# Patient Record
Sex: Female | Born: 1960 | Race: Black or African American | Hispanic: No | Smoking: Current every day smoker
Health system: Southern US, Community
[De-identification: ages and names within clinical notes are randomized; demographics above are authoritative.]

## PROBLEM LIST (undated history)

## (undated) DIAGNOSIS — I219 Acute myocardial infarction, unspecified: Secondary | ICD-10-CM

## (undated) DIAGNOSIS — I10 Essential (primary) hypertension: Secondary | ICD-10-CM

## (undated) DIAGNOSIS — I5042 Chronic combined systolic (congestive) and diastolic (congestive) heart failure: Secondary | ICD-10-CM

## (undated) DIAGNOSIS — IMO0002 Reserved for concepts with insufficient information to code with codable children: Secondary | ICD-10-CM

## (undated) DIAGNOSIS — M1990 Unspecified osteoarthritis, unspecified site: Secondary | ICD-10-CM

## (undated) DIAGNOSIS — I2510 Atherosclerotic heart disease of native coronary artery without angina pectoris: Secondary | ICD-10-CM

## (undated) DIAGNOSIS — N183 Chronic kidney disease, stage 3 unspecified: Secondary | ICD-10-CM

## (undated) DIAGNOSIS — G4700 Insomnia, unspecified: Secondary | ICD-10-CM

## (undated) DIAGNOSIS — J449 Chronic obstructive pulmonary disease, unspecified: Secondary | ICD-10-CM

## (undated) DIAGNOSIS — K219 Gastro-esophageal reflux disease without esophagitis: Secondary | ICD-10-CM

## (undated) DIAGNOSIS — E785 Hyperlipidemia, unspecified: Secondary | ICD-10-CM

## (undated) DIAGNOSIS — E119 Type 2 diabetes mellitus without complications: Secondary | ICD-10-CM

## (undated) DIAGNOSIS — Z9861 Coronary angioplasty status: Secondary | ICD-10-CM

## (undated) HISTORY — DX: Insomnia, unspecified: G47.00

## (undated) HISTORY — DX: Chronic kidney disease, stage 3 (moderate): N18.3

## (undated) HISTORY — PX: CORONARY ARTERY BYPASS GRAFT: SHX141

## (undated) HISTORY — DX: Coronary angioplasty status: Z98.61

## (undated) HISTORY — DX: Essential (primary) hypertension: I10

## (undated) HISTORY — DX: Chronic kidney disease, stage 3 unspecified: N18.30

## (undated) HISTORY — PX: OTHER SURGICAL HISTORY: SHX169

## (undated) HISTORY — DX: Type 2 diabetes mellitus without complications: E11.9

## (undated) HISTORY — PX: PARTIAL HYSTERECTOMY: SHX80

## (undated) HISTORY — PX: ABDOMINAL HYSTERECTOMY: SHX81

## (undated) HISTORY — DX: Reserved for concepts with insufficient information to code with codable children: IMO0002

## (undated) HISTORY — DX: Atherosclerotic heart disease of native coronary artery without angina pectoris: I25.10

## (undated) HISTORY — PX: BREAST EXCISIONAL BIOPSY: SUR124

## (undated) HISTORY — PX: CARPAL TUNNEL RELEASE: SHX101

---

## 1999-07-27 ENCOUNTER — Inpatient Hospital Stay (HOSPITAL_COMMUNITY): Admission: AD | Admit: 1999-07-27 | Discharge: 1999-07-28 | Payer: Self-pay | Admitting: Cardiology

## 2004-02-04 ENCOUNTER — Emergency Department: Payer: Self-pay | Admitting: Emergency Medicine

## 2004-05-08 ENCOUNTER — Inpatient Hospital Stay: Payer: Self-pay | Admitting: Internal Medicine

## 2004-07-27 ENCOUNTER — Ambulatory Visit: Payer: Self-pay | Admitting: Internal Medicine

## 2004-10-12 ENCOUNTER — Other Ambulatory Visit: Payer: Self-pay

## 2004-10-12 ENCOUNTER — Emergency Department: Payer: Self-pay | Admitting: Unknown Physician Specialty

## 2004-12-22 ENCOUNTER — Emergency Department: Payer: Self-pay | Admitting: Emergency Medicine

## 2004-12-22 ENCOUNTER — Other Ambulatory Visit: Payer: Self-pay

## 2005-06-19 ENCOUNTER — Ambulatory Visit: Payer: Self-pay | Admitting: Internal Medicine

## 2005-10-15 ENCOUNTER — Other Ambulatory Visit: Payer: Self-pay

## 2005-10-15 ENCOUNTER — Emergency Department: Payer: Self-pay | Admitting: Unknown Physician Specialty

## 2005-11-09 ENCOUNTER — Ambulatory Visit: Payer: Self-pay | Admitting: Cardiovascular Disease

## 2005-11-16 ENCOUNTER — Ambulatory Visit: Payer: Self-pay | Admitting: Oncology

## 2005-11-28 LAB — COMPREHENSIVE METABOLIC PANEL
ALT: 20 U/L (ref 0–40)
AST: 21 U/L (ref 0–37)
Alkaline Phosphatase: 87 U/L (ref 39–117)
Creatinine, Ser: 0.8 mg/dL (ref 0.40–1.20)
Total Bilirubin: 0.6 mg/dL (ref 0.3–1.2)

## 2005-11-28 LAB — CBC WITH DIFFERENTIAL (CANCER CENTER ONLY)
BASO#: 0.1 10*3/uL (ref 0.0–0.2)
EOS%: 2 % (ref 0.0–7.0)
HCT: 43.7 % (ref 34.8–46.6)
HGB: 14.6 g/dL (ref 11.6–15.9)
LYMPH#: 3.5 10*3/uL — ABNORMAL HIGH (ref 0.9–3.3)
MCHC: 33.5 g/dL (ref 32.0–36.0)
MCV: 93 fL (ref 81–101)
NEUT%: 58.4 % (ref 39.6–80.0)

## 2005-11-28 LAB — LACTATE DEHYDROGENASE: LDH: 149 U/L (ref 94–250)

## 2005-12-04 ENCOUNTER — Ambulatory Visit (HOSPITAL_COMMUNITY): Admission: RE | Admit: 2005-12-04 | Discharge: 2005-12-04 | Payer: Self-pay | Admitting: Oncology

## 2005-12-05 ENCOUNTER — Ambulatory Visit: Payer: Self-pay | Admitting: Internal Medicine

## 2006-04-21 ENCOUNTER — Emergency Department: Payer: Self-pay | Admitting: Emergency Medicine

## 2006-04-21 ENCOUNTER — Other Ambulatory Visit: Payer: Self-pay

## 2007-04-13 ENCOUNTER — Emergency Department: Payer: Self-pay | Admitting: Emergency Medicine

## 2007-07-16 ENCOUNTER — Other Ambulatory Visit: Payer: Self-pay

## 2007-07-16 ENCOUNTER — Emergency Department: Payer: Self-pay | Admitting: Emergency Medicine

## 2007-07-28 ENCOUNTER — Ambulatory Visit: Payer: Self-pay | Admitting: Cardiovascular Disease

## 2007-07-29 ENCOUNTER — Emergency Department (HOSPITAL_COMMUNITY): Admission: EM | Admit: 2007-07-29 | Discharge: 2007-07-29 | Payer: Self-pay | Admitting: Emergency Medicine

## 2007-09-13 ENCOUNTER — Emergency Department: Payer: Self-pay | Admitting: Unknown Physician Specialty

## 2008-01-09 ENCOUNTER — Emergency Department: Payer: Self-pay

## 2008-01-28 ENCOUNTER — Emergency Department: Payer: Self-pay | Admitting: Emergency Medicine

## 2008-01-29 ENCOUNTER — Emergency Department: Payer: Self-pay | Admitting: Emergency Medicine

## 2008-02-01 ENCOUNTER — Emergency Department: Payer: Self-pay | Admitting: Emergency Medicine

## 2008-02-08 ENCOUNTER — Emergency Department: Payer: Self-pay | Admitting: Emergency Medicine

## 2008-02-09 ENCOUNTER — Inpatient Hospital Stay: Payer: Self-pay | Admitting: Internal Medicine

## 2008-02-15 ENCOUNTER — Emergency Department: Payer: Self-pay | Admitting: Emergency Medicine

## 2008-02-25 ENCOUNTER — Ambulatory Visit: Payer: Self-pay | Admitting: Gastroenterology

## 2008-04-22 ENCOUNTER — Inpatient Hospital Stay: Payer: Self-pay | Admitting: Internal Medicine

## 2008-05-20 ENCOUNTER — Ambulatory Visit: Payer: Self-pay | Admitting: Internal Medicine

## 2008-08-14 ENCOUNTER — Emergency Department: Payer: Self-pay | Admitting: Emergency Medicine

## 2008-08-18 ENCOUNTER — Emergency Department: Payer: Self-pay | Admitting: Emergency Medicine

## 2008-12-07 ENCOUNTER — Emergency Department: Payer: Self-pay | Admitting: Internal Medicine

## 2009-01-31 ENCOUNTER — Emergency Department: Payer: Self-pay | Admitting: Emergency Medicine

## 2009-04-22 ENCOUNTER — Emergency Department: Payer: Self-pay | Admitting: Emergency Medicine

## 2009-05-05 ENCOUNTER — Ambulatory Visit: Payer: Self-pay | Admitting: Internal Medicine

## 2009-05-24 ENCOUNTER — Ambulatory Visit: Payer: Self-pay | Admitting: Specialist

## 2009-06-16 ENCOUNTER — Ambulatory Visit: Payer: Self-pay | Admitting: Specialist

## 2009-11-14 ENCOUNTER — Ambulatory Visit: Payer: Self-pay | Admitting: Internal Medicine

## 2009-11-28 ENCOUNTER — Inpatient Hospital Stay: Payer: Self-pay | Admitting: Internal Medicine

## 2009-12-15 ENCOUNTER — Ambulatory Visit: Payer: Self-pay | Admitting: Internal Medicine

## 2009-12-26 ENCOUNTER — Ambulatory Visit: Payer: Self-pay | Admitting: General Surgery

## 2009-12-28 ENCOUNTER — Inpatient Hospital Stay: Payer: Self-pay | Admitting: General Surgery

## 2010-01-17 LAB — PATHOLOGY REPORT

## 2010-02-20 ENCOUNTER — Emergency Department: Payer: Self-pay | Admitting: Emergency Medicine

## 2010-03-20 ENCOUNTER — Emergency Department: Payer: Self-pay | Admitting: Emergency Medicine

## 2010-03-21 ENCOUNTER — Emergency Department: Payer: Self-pay | Admitting: Emergency Medicine

## 2010-04-04 ENCOUNTER — Ambulatory Visit: Payer: Self-pay | Admitting: Internal Medicine

## 2010-04-17 ENCOUNTER — Emergency Department: Payer: Medicare Other | Admitting: Emergency Medicine

## 2010-05-29 ENCOUNTER — Emergency Department: Payer: Medicare Other | Admitting: Emergency Medicine

## 2010-09-01 NOTE — Op Note (Signed)
[place]  Patient:    VIVIEN, VILCHIS                     MRN: [id number] Proc. Date: 07/27/99 Adm. Date:  [id number] Attending:  Clent Demark CC:         _________             Cath Lab             Allegra Lai Terrence Dupont, M.D.                           Operative Report  PROCEDURE:  Successful percutaneous transluminal coronary angioplasty to proximal right coronary artery using 3.0 x 15 mm long CrossSail balloon.  INDICATIONS:  Mr. Bushra Mock is a 50 year old black female with past medical history significant for inferior wall MI, status post PTCA and stenting at [facility] in July 2000 and hypertension, non-insulin-dependent diabetes mellitus, hypercholesterolemia, tobacco abuse, positive family history of coronary artery  disease.  Complained of recurrent ______, chest pain approximately 3-4 times per day relieved with 1-2 sublingual nitroglycerin.  They last for about 6-7 minutes. Patient also gives history of rest and nocturnal angina for last 3-4 days and subsequently had left catheterization done today at [facility],  which showed good LV systolic function.  Left main was patent and LAD had 30-40% mid stenosis and the left circumflex had 30-40% mid stenosis.  RCA had 70% proximal in-stent stenosis, which 100% occluded due to catheter induced spasm, which resulted after intracoronary nitroglycerin.  Patient presently is pain-free. Patient ______.  Patient ______ of percutaneous intervention.  PROCEDURE:  After obtaining the informed consent, patient was brought to the catheterization laboratory and was placed on fluoroscopy table and left groin was prepped and draped in usual fashion.  Xylocaine 2% was used for local anesthesia in the left groin.  A thin-wall needle and 6-French arterial sheath were placed. he sheath was aspirated and flushed.  Next, a 6-French left right guiding catheter was advanced over the wire  under fluoroscopic guidance up to the ascending aorta. ire was pulled out, the catheter was aspirated and connected to the manifold. Catheter was further advanced and engaged into right coronary ostium.  ______ of the right system were taken.  FINDINGS:  RCA showed in-stent 75-80% restenosis and it appears distal half of he stent is also not fully expanded.  ______ procedure.  Successful PTCA to proximal RCA was done using 3.0 x 15 mm long CrossSail balloon.  Two inflations were done using ______ of pressure and lesion was dilated from 80% to less than 0% ______  ______ flow without evidence of dissection or distal embolization.  Patient received ______ heparin, Reopro and  150 of Plavix and intracoronary nitroglycerin prior to the procedure.  The patient tolerated the procedure well.  There were no complications. Patient was transferred to ______ angioplasty unit for hemodynamic monitoring and anticoagulation. DD:  07/28/99 TD:  07/28/99 Job: [id number]

## 2010-09-01 NOTE — Discharge Summary (Signed)
. [facility]  Patient:    Tasha Myers, Tasha Myers                     MRN: [id number] Adm. Date:  [id number] Disc. Date: 07/28/99 Attending:  Clent Demark CC:         Allegra Lai. Terrence Dupont, M.D.                           Discharge Summary  ADMITTING DIAGNOSES:  1. Accelerated angina status post left coronary artery bypass at [facility].  2. History of inferior wall myocardial infarction status post percutaneous     transluminal coronary angioplasty and stenting to proximal RCA in July, 2000,     at [facility].  3. Hypertension.  4. Insulin-requiring diabetes mellitus.  5. Hypercholesterolemia.  6. Positive family history of coronary artery disease.  7. Tobacco abuse.  FINAL DIAGNOSES:  1. Accelerated angina status post percutaneous transluminal coronary angioplasty     to proximal right coronary artery.  2. History of inferior wall myocardial infarction status post percutaneous     transluminal coronary angioplasty and stenting to right coronary artery in     July, 2000.  Stent was 3.0 x 16 Nir.  3. Hypertension.  4. Insulin-requiring diabetes mellitus.  5. Hypercholesterolemia.  6. Tobacco abuse.  7. Family history of coronary artery disease.  8. Anemia secondary to blood loss during the procedures.  9. Hydration. 10. History of depression.  DISCHARGE MEDICATIONS:  1. Tiazac 249 mg one capsule daily.  2. Imdur 60 mg one tablet daily in a.m.  3. Prinivil 10 mg one tablet daily.  4. Plavix 75 mg one tablet daily with food for one month.  5. Enteric-coated aspirin one tablet daily.  6. Lipitor 10 mg one tablet daily.  7. Tricor 200 mg one daily.  8. Nitrostat 0.4 mg sublingual, use as directed.  9. Humulin insulin 70/30, 20 units in the morning, 20 units in the evening as     before. 10. Zoloft 50 mg one tablet daily as before.  ACTIVITY:  Avoid heavy lifting, pushing, or pulling for 48 hours.  DIET:  Low-salt,  low-cholesterol, 1800-calorie, ADA diet.  DISCHARGE INSTRUCTIONS:  Post-angioplasty instructions have been given. Patient has been advised to monitor blood sugar closely and stop smoking, follow with Dr. ______ in one week.  CONDITION ON DISCHARGE:  Stable.  BRIEF HISTORY AND HOSPITAL COURSE:  Tasha Myers is a 50 year old female with past medical history significant for inferior wall MI status post PTCA and stenting at [facility] in July, 2000, history of hypertension, history of noninsulin-dependent  diabetes mellitus, hypercholesterolemia, tobacco abuse, positive family history of coronary artery disease, complaint of recurrent retrosternal squeezing chest pain three to four times per day relieved with one to two sublingual nitroglycerin.  Pain lasts for six to seven minutes.  Patient also gives history of rest and nocturnal angina for the last three to four days and subsequently had left ______ at [facility] which showed good LV systolic function.  Left main was patent.  The LAD and circumflex had 30 to 40% mid stenosis, RCA had 70% in-stent restenosis which got 100% included due to severe catheter-induced spasm which resolved after IC nitroglycerin.  Patient presently is pain-free and is transferred here for BCI.  PAST MEDICAL HISTORY:  As above.  PAST SURGICAL HISTORY:  She had bilateral carpal tunnel surgery in  1989.  She had partial hysterectomy at the age of 30 for menorrhagia.  ALLERGIES:  No known drug allergies.  MEDICATIONS:  1. Toprol XL 50 mg p.o. q.d.  2. Norvasc 5 mg p.o. q.d.  3. Prinivil 20 mg p.o. q.d.  4. Imdur 60 mg p.o. q.d.  5. Avandia 4 mg p.o. q.d.  6. Zoloft 50 mg p.o. q.d.  7. Prilosec 20 mg p.o. b.i.d.  8. Humulin insulin 70/30 20 units in the morning and 20 units in the evening.   SOCIAL HISTORY:  She is married, on disability.  Smoked 1-1/2 pack for 20 years and now half pack for the last two to three months.  She drinks  beer socially.  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  FAMILY HISTORY:  Father died of diabetic complications.  Mother is alive.  She ad MI in her 77s.  She had a stroke subsequently.  She has seven sisters and three  brothers in good health.  PHYSICAL EXAMINATION:  GENERAL:  She alert, awake, oriented x 3 in no acute distress, hemodynamically stable, sinus rhythm on monitor.  LUNGS:  Clear.  CARDIOVASCULAR:  S1, S2 normal.  There was no S3 gallop.  ABDOMEN:  Soft.  Bowel sounds present.  Nontender.  EXTREMITIES:  No clubbing, cyanosis, or edema.  GU:  Right groin - there was no evidence of hematoma.  HOSPITAL COURSE:  Discussed with patient regarding PTCA for in-stent restenosis, its risks, i.e., death, MI, stroke, need for emergency CABG, restenosis, local vascular complications ______ and consented for the procedure.  Patient subsequently underwent PTCA to the proximal RCA.  As per PTCA report, patient tolerated the procedure well.  There were no complications.  Her artery sheaths  were pulled out last night.  There was no evidence of hematoma.  Patient has ambulated in the morning without any problems.  Her post-procedure CPKs are 54 which is negative.  Her potassium is 3.5.  Hemoglobin and hematocrit have been stable.  Her BUN is 9, creatinine 0.6.  Patient will be discharged home on above medications and will be followed by Dr. ______ in one week.  Patient has been advised as to rehab which she will discuss with Dr. ______ and will go to [facility].  Patient is discharged home in stable condition. DD:  07/28/99 TD:  07/28/99 Job: [id number]

## 2010-09-01 NOTE — Assessment & Plan Note (Signed)
[facility]                               PULMONARY OFFICE NOTE   NAME:JONESSintia, Greenly                     MRN:          [id number]  DATE:12/05/2005                            DOB:          07-14-1960    HISTORY:  A 50 year old black female with intermittent chest pain that comes  and goes, over the last year always in the same location, that is anterior  just to the left of her sternum radiating to her back, positional in nature,  worse when she lies down at night, now back 100%.  Now is being evaluated  for an abnormal CT scan but note the chest pain has totally resolved that  resulted in a chest CT scan that suggested multiple nodules.  A PET was  subsequently done by Dr. Humphrey Rolls which indicated no evidence of activity, nor  was there any evidence of significant activity in lymph nodes.  Patient  denies any unintended weight loss, previous history of cancer, fevers,  chills, sweats, associated cough or dyspnea.   PAST MEDICAL HISTORY:  Significant for hypertension, ischemic heart disease  status post angioplasty in 2000.   ALLERGIES:  None known.   MEDICATIONS:  Taken in detail. In fact she is on three different PPIs.  See  column dated December 05, 2005 for details.   SOCIAL HISTORY:  She has been an active smoker for 37 years at a pack per  day.  Denies any unusual hobby, travel, pet or pet exposure.   FAMILY HISTORY:  Recorded in detail, significant for the absence of  malignancy or rheumatism.   REVIEW OF SYSTEMS:  Taken in detail and significant for the absence of  significant myalgias, arthralgias or other rheumatologic complaints, fever,  chills, sweats, unexplained weight loss.   PHYSICAL EXAMINATION:  This is a pleasant and oriented, slightly anxious,  black female in no acute distress.  VITAL SIGNS:  Stable.  HEENT:  Unremarkable.  Nasal turbinates normal.  No crusting or ulceration.  Oropharynx is clear.  Dentition is intact.  NECK:  Supple without cervical adenopathy or tenderness.  Trache is midline.  No thyromegaly.  LUNGS:  Lung fields perfectly clear bilaterally to auscultation, percussion.  There is a regular rate and rhythm without murmur, gap, rub present.  ABDOMEN:  Soft, benign with no palpable organomegaly or tenderness.  EXTREMITIES:  Warm without calf tenderness, cyanosis, clubbing or edema.   CT scan and PET scans were reviewed with no additional findings.  No  baseline chest x-ray is available.   IMPRESSION:  1. Stereotypical chest discomfort, left of midline, recurrent over the      last year or so with no distinguishing features and certainly does      not appear to correlate with present CT scan findings.  The only test      that I would do in this setting is a sed rate to see if there is any      evidence of either a rheumatologic or malignant evidence of rheumatism      or underlying malignancy.  That is a normal sed  rate would be very      reassuring in this setting.  If the sed rate is markedly elevated I      might consider an open lung biopsy but it is very unlikely that      transbronchial biopsy would be of benefit here based on a poor      sampling.  2. Therefore in terms of the nodules I have asked the patient to return in      4-6 weeks for a chest x-ray but might consider a biopsy if her sed rate      is markedly elevated.  In terms of the chest discomfort I have nothing      to add from a pulmonary perspective.  It is not pleuritic in nature,      seems positional and probably related to a musculoskeletal problem.  3. Complex polypharmacy.  I note for the record that she reports she is on      three different PPIs.  I suspect the reason she keeps getting put on      PPIs is because of the atypical nature of her chest pain but note that      it does not appear that the PPIs help the pain.  If it is felt she has      significant reflux the best choice probably would be Zegerid 40  mg at      bedtime which I have asked her to continue but stop the other two PPIs      for now.                                   Christena Deem. Melvyn Novas, MD, [facility]   MBW/MedQ  DD:  12/05/2005  DT:  12/06/2005  Job #:  [id number]   cc:   Marcy Panning, MD

## 2010-09-16 ENCOUNTER — Emergency Department: Payer: Medicare Other | Admitting: Unknown Physician Specialty

## 2010-10-06 ENCOUNTER — Ambulatory Visit: Payer: Self-pay | Admitting: Cardiology

## 2010-10-24 ENCOUNTER — Ambulatory Visit (INDEPENDENT_AMBULATORY_CARE_PROVIDER_SITE_OTHER): Payer: Medicare Other | Admitting: Cardiovascular Disease

## 2010-10-24 ENCOUNTER — Encounter: Payer: Self-pay | Admitting: Cardiovascular Disease

## 2010-10-24 DIAGNOSIS — I2510 Atherosclerotic heart disease of native coronary artery without angina pectoris: Secondary | ICD-10-CM

## 2010-10-24 DIAGNOSIS — E119 Type 2 diabetes mellitus without complications: Secondary | ICD-10-CM

## 2010-10-24 DIAGNOSIS — E785 Hyperlipidemia, unspecified: Secondary | ICD-10-CM

## 2010-10-24 DIAGNOSIS — I10 Essential (primary) hypertension: Secondary | ICD-10-CM

## 2010-10-24 MED ORDER — ISOSORBIDE MONONITRATE ER 30 MG PO TB24: 30.0000 mg | ORAL_TABLET | Freq: Every day | ORAL | Status: DC

## 2010-10-24 MED ORDER — LISINOPRIL 20 MG PO TABS: 20.0000 mg | ORAL_TABLET | Freq: Every day | ORAL | Status: DC

## 2010-10-24 MED ORDER — ATORVASTATIN CALCIUM 40 MG PO TABS: 40.0000 mg | ORAL_TABLET | Freq: Every day | ORAL | Status: DC

## 2010-10-24 NOTE — Patient Instructions (Addendum)
You are doing well. Please start lipitor daily Please increase lisinopril to 20 mg daily Start isosorbide daily Start aspirin 81 mg x2 daily Continue the medications that you are on.  Please call us if you have new issues that need to be addressed before your next appt.  We will call you for a follow up Appt. In 3 months

## 2010-10-25 DIAGNOSIS — I10 Essential (primary) hypertension: Secondary | ICD-10-CM | POA: Insufficient documentation

## 2010-10-25 DIAGNOSIS — E785 Hyperlipidemia, unspecified: Secondary | ICD-10-CM | POA: Insufficient documentation

## 2010-10-25 DIAGNOSIS — I2510 Atherosclerotic heart disease of native coronary artery without angina pectoris: Secondary | ICD-10-CM | POA: Insufficient documentation

## 2010-10-25 DIAGNOSIS — E119 Type 2 diabetes mellitus without complications: Secondary | ICD-10-CM | POA: Insufficient documentation

## 2010-10-25 MED ORDER — ASPIRIN EC 81 MG PO TBEC: 81.0000 mg | DELAYED_RELEASE_TABLET | Freq: Two times a day (BID) | ORAL | Status: DC

## 2010-10-25 NOTE — Assessment & Plan Note (Signed)
Poorly controlled diabetes. We have stressed the importance of being on a cholesterol medication. We will start her on Lipitor 20 mg, titrating up to 40 mg daily.
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
## 2010-10-25 NOTE — Assessment & Plan Note (Signed)
We have stressed the importance of following a strict diet. We have given her a dietary diet. We have suggested she followup closely with her primary care physician for further medication titration for diabetes.

## 2010-10-25 NOTE — Assessment & Plan Note (Signed)
Blood pressure is poorly controlled. She is uncertain what medications she is taking. She does not think that she is taking clonidine. She thinks she is taking lisinopril. She does not like taking medications in general and would prefer a once a day medication or a b.i.d. Dosing. We have suggested we increase her lisinopril to 20 mg daily and start isosorbide mononitrate 30 mg daily. The isosorbide could be titrated upwards as needed to 60 mg daily or b.i.d.. I suspect management of her blood pressure and other medical issues will be challenging given her medication noncompliance.

## 2010-10-25 NOTE — Assessment & Plan Note (Signed)
Currently with no symptoms of angina. No further workup at this time. Will add a statin to her regimen.

## 2010-10-25 NOTE — Progress Notes (Signed)
Addended by: Alfonse Spruce on: 10/25/2010 01:56 PM   Modules accepted: Orders

## 2010-12-13 ENCOUNTER — Emergency Department: Payer: Medicare Other | Admitting: Emergency Medicine

## 2011-01-09 LAB — POCT I-STAT, CHEM 8
Calcium, Ion: 1.22
HCT: 40
Hemoglobin: 13.6
TCO2: 28

## 2011-01-09 LAB — CBC
HCT: 39
Hemoglobin: 13.1
MCHC: 33.7
RDW: 14.1

## 2011-01-09 LAB — DIFFERENTIAL
Basophils Absolute: 0.1
Basophils Relative: 1
Eosinophils Relative: 2
Monocytes Absolute: 0.5
Neutro Abs: 6.5

## 2011-01-09 LAB — POCT CARDIAC MARKERS: Operator id: 265201

## 2011-01-15 ENCOUNTER — Telehealth: Payer: Self-pay | Admitting: Cardiovascular Disease

## 2011-01-15 NOTE — Telephone Encounter (Signed)
Is this something we can do? Please advise.

## 2011-01-15 NOTE — Telephone Encounter (Signed)
Pt came in wanting to get a letter for Social services so that they would help her with her light bill due to her medical condition. Letter from [facility] states that she or someone in her house hold just have a medical condition that is affected by the heat or cold. Pt states that she has CHF and Asthma. Explained that the asthma would need to come from her Pulmonary or PCP. Please fax to [phone] if able to fill out.

## 2011-01-17 NOTE — Telephone Encounter (Signed)
She does not have CHF She has CAD from poor diabetes control. Would try PMD and use diabetes as the reason.

## 2011-01-18 NOTE — Telephone Encounter (Signed)
Pt.notified

## 2011-01-24 ENCOUNTER — Ambulatory Visit: Payer: Medicare Other | Admitting: Cardiovascular Disease

## 2011-01-26 ENCOUNTER — Ambulatory Visit: Payer: Medicare Other | Admitting: Cardiovascular Disease

## 2011-02-08 ENCOUNTER — Encounter: Payer: Self-pay | Admitting: Cardiovascular Disease

## 2011-02-09 ENCOUNTER — Emergency Department: Payer: Medicare Other | Admitting: Emergency Medicine

## 2011-02-26 ENCOUNTER — Encounter: Payer: Self-pay | Admitting: Cardiovascular Disease

## 2011-02-26 ENCOUNTER — Telehealth: Payer: Self-pay | Admitting: *Deleted

## 2011-02-26 ENCOUNTER — Ambulatory Visit (INDEPENDENT_AMBULATORY_CARE_PROVIDER_SITE_OTHER): Payer: Medicare Other | Admitting: Cardiovascular Disease

## 2011-02-26 VITALS — BP 156/82 | HR 81 | Ht 65.0 in | Wt 165.8 lb

## 2011-02-26 DIAGNOSIS — E119 Type 2 diabetes mellitus without complications: Secondary | ICD-10-CM

## 2011-02-26 DIAGNOSIS — I2510 Atherosclerotic heart disease of native coronary artery without angina pectoris: Secondary | ICD-10-CM

## 2011-02-26 DIAGNOSIS — E785 Hyperlipidemia, unspecified: Secondary | ICD-10-CM

## 2011-02-26 DIAGNOSIS — I10 Essential (primary) hypertension: Secondary | ICD-10-CM

## 2011-02-26 MED ORDER — PRAVASTATIN SODIUM 40 MG PO TABS: 40.0000 mg | ORAL_TABLET | Freq: Every evening | ORAL | Status: DC

## 2011-02-26 NOTE — Assessment & Plan Note (Signed)
She has not checked her sugars at home unless she feels poorly. She reports previous glucose levels in the 400s, now in the 200s. We have encouraged her to follow a more strict diet and have given her a diet guide.

## 2011-02-26 NOTE — Assessment & Plan Note (Signed)
This is her second visit to the office and she did not bring her medications in, she is not taking her medications on a regular basis, she does not know what she is taking. We have asked her to call us with a list of her medications.

## 2011-02-26 NOTE — Telephone Encounter (Signed)
Can you see if patient has been using crestor samples or if she gets it for a good price. Otherwise start something she can afford.  Significant medication noncompliance

## 2011-02-26 NOTE — Assessment & Plan Note (Signed)
Cholesterol is poorly controlled. She does report having leg weakness with Lipitor. Given her very high risk for worsening coronary and peripheral vascular disease, we will start something. We'll start with pravastatin 20 mg titrating up to 40 mg as tolerated. Ideally we would have liked to start Crestor though she is having to pay more for her medications out of pocket

## 2011-02-26 NOTE — Patient Instructions (Addendum)
You are doing well. Please start pravastatin 20 mg a day (1/2 dose) for one month Then increase to a full pill if you have no significant leg pain  Call with your blood pressure medication names  Please call us if you have new issues that need to be addressed before your next appt.  The office will contact you for a follow up Appt. In 6 months

## 2011-02-26 NOTE — Assessment & Plan Note (Signed)
Very high risk of worsening coronary artery disease. She continues to smoke, has poorly controlled cholesterol and diabetes.  Currently with no episodes of chest pain. We have suggested we need to tackle each one of the above risk factors.

## 2011-02-26 NOTE — Telephone Encounter (Signed)
Pt called back after she got home to verify meds she is taking: Crestor 10mg , NTG PRN, Lasxi 20mg  qd, Coreg 25mg  BID, Plavix 75mg  qd. I know you had prescribed Pravastatin today, we did not know if pt took chol med. Do you want [place] to d/c pravastatin?
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
## 2011-02-28 NOTE — Telephone Encounter (Signed)
Attempted to contact pt, LMOM TCB.  

## 2011-02-28 NOTE — Telephone Encounter (Signed)
She had filled at pharmacy, cannot recall the price but she would rather take Pravastatin since cheaper. Pt will complete what she has of Crestor, then start Pravastatin as ordered.

## 2011-05-01 ENCOUNTER — Ambulatory Visit: Payer: Self-pay | Admitting: Family Medicine

## 2011-05-22 ENCOUNTER — Emergency Department: Payer: Self-pay | Admitting: Emergency Medicine

## 2011-05-22 LAB — COMPREHENSIVE METABOLIC PANEL
Alkaline Phosphatase: 93 U/L (ref 50–136)
BUN: 16 mg/dL (ref 7–18)
Bilirubin,Total: 0.5 mg/dL (ref 0.2–1.0)
Calcium, Total: 9 mg/dL (ref 8.5–10.1)
Chloride: 106 mmol/L (ref 98–107)
Creatinine: 1.01 mg/dL (ref 0.60–1.30)
Glucose: 182 mg/dL — ABNORMAL HIGH (ref 65–99)
Potassium: 4.4 mmol/L (ref 3.5–5.1)
SGOT(AST): 22 U/L (ref 15–37)
SGPT (ALT): 19 U/L
Sodium: 141 mmol/L (ref 136–145)
Total Protein: 7.4 g/dL (ref 6.4–8.2)

## 2011-05-22 LAB — URINALYSIS, COMPLETE
Bilirubin,UR: NEGATIVE
Leukocyte Esterase: NEGATIVE
Nitrite: NEGATIVE
Ph: 5 (ref 4.5–8.0)
Protein: 30

## 2011-05-22 LAB — CBC
HCT: 38.2 % (ref 35.0–47.0)
MCHC: 34.4 g/dL (ref 32.0–36.0)
MCV: 97 fL (ref 80–100)
RBC: 3.93 10*6/uL (ref 3.80–5.20)
RDW: 15 % — ABNORMAL HIGH (ref 11.5–14.5)
WBC: 11 10*3/uL (ref 3.6–11.0)

## 2011-05-22 LAB — TROPONIN I: Troponin-I: <0.02 ng/mL

## 2011-10-16 ENCOUNTER — Ambulatory Visit: Payer: Medicare Other | Admitting: Cardiovascular Disease

## 2011-10-16 ENCOUNTER — Telehealth: Payer: Self-pay

## 2011-10-16 NOTE — Telephone Encounter (Signed)
Error

## 2011-10-16 NOTE — Telephone Encounter (Signed)
FYI

## 2011-10-16 NOTE — Telephone Encounter (Signed)
PCP called back. They told pt she would not be seeing Dr. Rockey Situ today for work in appt.  Pt says she does not want to see Dr. Fletcher Anon.  Nurse explained to pt Dr. Rockey Situ is not here today and may not be able to see him until next week.  Nurse advised pt, if she is not going to keep appt today, to go to ER since she is having active CP with abnormal EKG and positive hx CAD and stents.  Pt refused. She told nurse she will go home and call us to make appt with Dr. Rockey Situ for next week.  I reassured nurse all she can do is document and I will inform Dr. Fletcher Anon and Dr. Rockey Situ of pt's decision.  It may be that we can get pt in sooner than next week with Dr. Rockey Situ but he is definitely not in office today.  Pt can either come in today to see Dr. Fletcher Anon or go to ER.  Nurse verb. Understanding and will let pt know.  We will await pt call for appt with Dr. Rockey Situ at pt's discretion.

## 2011-10-16 NOTE — Telephone Encounter (Signed)
Rec'd t/c from [facility].  Pt is in their office with c/o CP x 3 days.  EKG was performed and tells me this is "abnormal".  They want to know if pt can be seen ASAP.  I had them hold while I discussed with Dr. Fletcher Anon who says to have pt come now. Office verb. understanding and will fax EKG as well.

## 2011-10-24 ENCOUNTER — Ambulatory Visit (INDEPENDENT_AMBULATORY_CARE_PROVIDER_SITE_OTHER): Payer: Medicare Other | Admitting: Cardiovascular Disease

## 2011-10-24 ENCOUNTER — Encounter: Payer: Self-pay | Admitting: Cardiovascular Disease

## 2011-10-24 VITALS — BP 100/60 | HR 82 | Ht 62.0 in | Wt 169.5 lb

## 2011-10-24 DIAGNOSIS — E785 Hyperlipidemia, unspecified: Secondary | ICD-10-CM

## 2011-10-24 DIAGNOSIS — I10 Essential (primary) hypertension: Secondary | ICD-10-CM

## 2011-10-24 DIAGNOSIS — E119 Type 2 diabetes mellitus without complications: Secondary | ICD-10-CM

## 2011-10-24 DIAGNOSIS — I2510 Atherosclerotic heart disease of native coronary artery without angina pectoris: Secondary | ICD-10-CM

## 2011-10-24 NOTE — Patient Instructions (Addendum)
You are doing well. Blood pressure is too low  Take the lisinopril/HCTZ in the AM Take the isosorbide at night (in the PM) Take the coreg twice a day  Please call us if you have new issues that need to be addressed before your next appt.  Your physician wants you to follow-up in: 6 months.  You will receive a reminder letter in the mail two months in advance. If you don't receive a letter, please call our office to schedule the follow-up appointment.

## 2011-10-24 NOTE — Assessment & Plan Note (Signed)
She has chronic history of atypical type chest pain, likely cramping above her left breast. Worse with palpation.Currently with no symptoms of angina. No further workup at this time. Continue current medication regimen.

## 2011-10-24 NOTE — Assessment & Plan Note (Signed)
Blood pressure is low today. She did take an extra lisinopril HCTZ as she did not feel well earlier today. She felt her blood pressure was high. We have suggested she take her isosorbide in the evening and not take additional medication and monitor her blood pressure when possible.

## 2011-10-24 NOTE — Assessment & Plan Note (Signed)
Hemoglobin A1c has significantly improved down to 7.1. We have encouraged continued exercise, careful diet management in an effort to lose weight.
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
## 2011-10-24 NOTE — Assessment & Plan Note (Signed)
Goal LDL less than 70. We have commended her on staying on her statin. Problems in the past on Lipitor.

## 2011-11-10 ENCOUNTER — Emergency Department (HOSPITAL_COMMUNITY): Payer: Medicare Other

## 2011-11-10 ENCOUNTER — Encounter (HOSPITAL_COMMUNITY): Payer: Self-pay | Admitting: Emergency Medicine

## 2011-11-10 ENCOUNTER — Emergency Department: Payer: Self-pay | Admitting: Unknown Physician Specialty

## 2011-11-10 ENCOUNTER — Emergency Department (HOSPITAL_COMMUNITY)
Admission: EM | Admit: 2011-11-10 | Discharge: 2011-11-10 | Disposition: A | Discharge status: Home / Self Care | Payer: Medicare Other | Attending: Emergency Medicine | Admitting: Emergency Medicine

## 2011-11-10 DIAGNOSIS — I2510 Atherosclerotic heart disease of native coronary artery without angina pectoris: Secondary | ICD-10-CM | POA: Insufficient documentation

## 2011-11-10 DIAGNOSIS — Z951 Presence of aortocoronary bypass graft: Secondary | ICD-10-CM | POA: Insufficient documentation

## 2011-11-10 DIAGNOSIS — I10 Essential (primary) hypertension: Secondary | ICD-10-CM | POA: Insufficient documentation

## 2011-11-10 DIAGNOSIS — R042 Hemoptysis: Secondary | ICD-10-CM | POA: Insufficient documentation

## 2011-11-10 DIAGNOSIS — G4700 Insomnia, unspecified: Secondary | ICD-10-CM | POA: Insufficient documentation

## 2011-11-10 DIAGNOSIS — E119 Type 2 diabetes mellitus without complications: Secondary | ICD-10-CM | POA: Insufficient documentation

## 2011-11-10 DIAGNOSIS — F17200 Nicotine dependence, unspecified, uncomplicated: Secondary | ICD-10-CM | POA: Insufficient documentation

## 2011-11-10 LAB — COMPREHENSIVE METABOLIC PANEL
Albumin: 4 g/dL (ref 3.4–5.0)
Anion Gap: 8 (ref 7–16)
Calcium, Total: 9.4 mg/dL (ref 8.5–10.1)
Chloride: 106 mmol/L (ref 98–107)
Co2: 25 mmol/L (ref 21–32)
EGFR (African American): >60
EGFR (Non-African Amer.): 54 — ABNORMAL LOW
Osmolality: 281 (ref 275–301)
Potassium: 4.1 mmol/L (ref 3.5–5.1)
SGOT(AST): 27 U/L (ref 15–37)
SGPT (ALT): 24 U/L
Sodium: 139 mmol/L (ref 136–145)

## 2011-11-10 LAB — BASIC METABOLIC PANEL
Chloride: 102 mEq/L (ref 96–112)
Creatinine, Ser: 1.05 mg/dL (ref 0.50–1.10)
GFR calc Af Amer: 69 mL/min — ABNORMAL LOW (ref >90)
Potassium: 4.2 mEq/L (ref 3.5–5.1)
Sodium: 139 mEq/L (ref 135–145)

## 2011-11-10 LAB — TROPONIN I: Troponin-I: <0.02 ng/mL

## 2011-11-10 LAB — CBC WITH DIFFERENTIAL/PLATELET
Basophils Absolute: 0 10*3/uL (ref 0.0–0.1)
Basophils Relative: 0 % (ref 0–1)
MCHC: 34.9 g/dL (ref 30.0–36.0)
Monocytes Absolute: 0.7 10*3/uL (ref 0.1–1.0)
Neutro Abs: 6.7 10*3/uL (ref 1.7–7.7)
Neutrophils Relative %: 61 % (ref 43–77)
Platelets: 252 10*3/uL (ref 150–400)
RDW: 14.7 % (ref 11.5–15.5)
WBC: 11.1 10*3/uL — ABNORMAL HIGH (ref 4.0–10.5)

## 2011-11-10 LAB — CBC
HCT: 43.2 % (ref 35.0–47.0)
MCH: 33.2 pg (ref 26.0–34.0)
MCHC: 34.5 g/dL (ref 32.0–36.0)
MCV: 96 fL (ref 80–100)
Platelet: 260 10*3/uL (ref 150–440)
RDW: 15.8 % — ABNORMAL HIGH (ref 11.5–14.5)

## 2011-11-10 MED ORDER — SODIUM CHLORIDE 0.9 % IV BOLUS (SEPSIS)
500.0000 mL | Freq: Once | INTRAVENOUS | Status: AC
  Administered 2011-11-10: 1000 mL via INTRAVENOUS

## 2011-11-10 NOTE — ED Notes (Signed)
Pt comes to the ED after being seen at [facility] today for the same issue. Pt stated she has been coughing up dark red clots for the past three days.  Pt denies any cp but states she has been feeling dizzy but thinks this is because of her blood pressure medication.

## 2011-11-10 NOTE — ED Notes (Signed)
Patient with coughing up blood for the last three days.  Patient states that she is coughing up dark red clots.

## 2011-11-10 NOTE — ED Provider Notes (Signed)
History     CSN: [id number]  Arrival date & time 11/10/11  1931   First MD Initiated Contact with Patient 11/10/11 2014      Chief Complaint  Patient presents with  . Hematemesis    (Consider location/radiation/quality/duration/timing/severity/associated sxs/prior treatment) Patient is a 51 y.o. female presenting with cough. The history is provided by the patient.  Cough This is a new problem. The current episode started more than 2 days ago. The problem has not changed since onset.The cough is productive of bloody sputum. There has been no fever. Pertinent negatives include no chest pain, no chills, no sweats, no myalgias, no shortness of breath and no wheezing. Associated symptoms comments: She reports she is coughing up blood with small clots for the past three days. No fever, shortness of breath, chest pain, or night sweats. The cough occurs when she lies down at night, she falls asleep and sleeps through the night without being awakened by cough, and then in the morning she coughs to clear her throat. During the day she is asymptomatic. No nasal congestion, sinus symptoms or sore throat. She reports she started smoking again x 1 week. . She has tried nothing for the symptoms. She is a smoker.    Past Medical History  Diagnosis Date  . Diabetes mellitus   . Hypertension   . Thoracic or lumbosacral neuritis or radiculitis, unspecified   . Coronary artery disease   . Heart failure   . Insomnia     Past Surgical History  Procedure Date  . Lymph node resection   . Coronary artery bypass graft   . Partial hysterectomy   . Carpal tunnel release     BILATERAL    Family History  Problem Relation Age of Onset  . Coronary artery disease Mother   . Hypertension Mother   . Diabetes Father   . Stroke Mother   . Stroke      sibling  . Coronary artery disease      sibling  . Diabetes      sibling  . Hypertension      sibling    History  Substance Use Topics  . Smoking  status: Current Everyday Smoker -- 1.0 packs/day for 40 years    Types: Cigarettes  . Smokeless tobacco: Never Used  . Alcohol Use: 1.2 oz/week    2 Cans of beer per week    OB History    Grav Para Term Preterm Abortions TAB SAB Ect Mult Living                  Review of Systems  Constitutional: Negative for chills.  HENT: Negative for congestion, trouble swallowing and postnasal drip.   Respiratory: Positive for cough. Negative for shortness of breath and wheezing.   Cardiovascular: Negative for chest pain.  Gastrointestinal: Negative for nausea, vomiting and abdominal pain.  Musculoskeletal: Negative for myalgias.    Allergies  Review of patient's allergies indicates no known allergies.  Home Medications   Current Outpatient Rx  Name Route Sig Dispense Refill  . AMITRIPTYLINE HCL 50 MG PO TABS  Takes 2 tablets nightly.    . ASPIRIN EC 81 MG PO TBEC Oral Take 81 mg by mouth 2 (two) times daily.    Marland Kitchen CARVEDILOL 25 MG PO TABS Oral Take 25 mg by mouth 2 (two) times daily with a meal.      . CLOPIDOGREL BISULFATE 75 MG PO TABS Oral Take 75 mg by mouth daily.      Marland Kitchen  CYANOCOBALAMIN 1000 MCG PO TABS Oral Take 100 mcg by mouth daily.    . FUROSEMIDE 20 MG PO TABS Oral Take 20 mg by mouth daily.     Marland Kitchen GLIPIZIDE ER 5 MG PO TB24 Oral Take 5 mg by mouth daily.    Marland Kitchen LISINOPRIL-HYDROCHLOROTHIAZIDE 20-25 MG PO TABS Oral Take 1 tablet by mouth daily.    Marland Kitchen METFORMIN HCL 1000 MG PO TABS Oral Take 1,000 mg by mouth 2 (two) times daily with a meal.    . NITROGLYCERIN 0.4 MG SL SUBL Sublingual Place 0.4 mg under the tongue every 5 (five) minutes as needed.      Marland Kitchen PRAVASTATIN SODIUM 40 MG PO TABS Oral Take 40 mg by mouth at bedtime.    Marland Kitchen PREGABALIN 150 MG PO CAPS Oral Take 150 mg by mouth 2 (two) times daily.    Hendricks Limes CONTINUING MONTH PAK PO Oral Take by mouth. Daily as directed.    . ISOSORBIDE MONONITRATE ER 30 MG PO TB24 Oral Take 30 mg by mouth daily.       BP 177/89  Pulse 108   Temp 98.4 F (36.9 C) (Oral)  Resp 16  SpO2 100%  Physical Exam  Constitutional: She is oriented to person, place, and time. She appears well-developed and well-nourished.  HENT:  Head: Normocephalic.  Neck: Normal range of motion. Neck supple.  Cardiovascular: Normal rate and regular rhythm.   No murmur heard. Pulmonary/Chest: Effort normal and breath sounds normal. She has no wheezes. She has no rales.  Abdominal: Soft. Bowel sounds are normal. There is no tenderness. There is no rebound and no guarding.  Musculoskeletal: Normal range of motion.  Neurological: She is alert and oriented to person, place, and time. Coordination normal.  Skin: Skin is warm and dry. No rash noted.  Psychiatric: She has a normal mood and affect.    ED Course  Procedures (including critical care time)  Labs Reviewed  CBC WITH DIFFERENTIAL - Abnormal; Notable for the following:    WBC 11.1 (*)     Hemoglobin 15.4 (*)     All other components within normal limits  BASIC METABOLIC PANEL - Abnormal; Notable for the following:    Glucose, Bld 145 (*)     GFR calc non Af Amer 60 (*)     GFR calc Af Amer 69 (*)     All other components within normal limits   Results for orders placed during the hospital encounter of 11/10/11  CBC WITH DIFFERENTIAL      Component Value Range   WBC 11.1 (*) 4.0 - 10.5 K/uL   RBC 4.72  3.87 - 5.11 MIL/uL   Hemoglobin 15.4 (*) 12.0 - 15.0 g/dL   HCT 44.1  36.0 - 46.0 %   MCV 93.4  78.0 - 100.0 fL   MCH 32.6  26.0 - 34.0 pg   MCHC 34.9  30.0 - 36.0 g/dL   RDW 14.7  11.5 - 15.5 %   Platelets 252  150 - 400 K/uL   Neutrophils Relative 61  43 - 77 %   Neutro Abs 6.7  1.7 - 7.7 K/uL   Lymphocytes Relative 31  12 - 46 %   Lymphs Abs 3.5  0.7 - 4.0 K/uL   Monocytes Relative 6  3 - 12 %   Monocytes Absolute 0.7  0.1 - 1.0 K/uL   Eosinophils Relative 2  0 - 5 %   Eosinophils Absolute 0.2  0.0 - 0.7 K/uL  Basophils Relative 0  0 - 1 %   Basophils Absolute 0.0  0.0 -  0.1 K/uL  BASIC METABOLIC PANEL      Component Value Range   Sodium 139  135 - 145 mEq/L   Potassium 4.2  3.5 - 5.1 mEq/L   Chloride 102  96 - 112 mEq/L   CO2 24  19 - 32 mEq/L   Glucose, Bld 145 (*) 70 - 99 mg/dL   BUN 18  6 - 23 mg/dL   Creatinine, Ser 1.05  0.50 - 1.10 mg/dL   Calcium 10.2  8.4 - 10.5 mg/dL   GFR calc non Af Amer 60 (*) >90 mL/min   GFR calc Af Amer 69 (*) >90 mL/min    Dg Chest 2 View  11/10/2011  *RADIOLOGY REPORT*  Clinical Data: Hematemesis.  Short of breath.  CHEST - 2 VIEW  Comparison: 07/29/2007  Findings: Mild cardiomegaly.  Central interstitial edema. Bronchitic changes.  No peripheral consolidation.  No pneumothorax. Left basilar Kerley B lines.  IMPRESSION: Mild interstitial edema and cardiomegaly.  Original Report Authenticated By: Jamas Lav, M.D.     No diagnosis found. 1. Hemoptysis    MDM  No hemoptysis in ED. She feels at baseline. CXR shows mild CM but no infiltrates. Labs unremarkable. Dr. Christy Gentles in to see patient and feels she is stable for discharge.         Leotis Shames, PA-C 11/10/11 2207

## 2011-11-12 NOTE — ED Provider Notes (Signed)
Medical screening examination/treatment/procedure(s) were conducted as a shared visit with non-physician practitioner(s) and myself.  I personally evaluated the patient during the encounter  Pt well appearing, only describes  Small amt of hemoptysis.  stressed need for f/u.    Sharyon Cable, MD 11/12/11 [phone]

## 2012-04-16 HISTORY — PX: BREAST BIOPSY: SHX20

## 2012-08-28 ENCOUNTER — Ambulatory Visit: Payer: Self-pay

## 2012-09-04 ENCOUNTER — Ambulatory Visit: Payer: Self-pay

## 2013-09-27 ENCOUNTER — Encounter: Payer: Self-pay | Admitting: Cardiology

## 2013-09-28 ENCOUNTER — Inpatient Hospital Stay (HOSPITAL_COMMUNITY)
Admission: EM | Admit: 2013-09-28 | Discharge: 2013-09-30 | DRG: 286 | Disposition: A | Discharge status: Home / Self Care | Payer: Medicare HMO | Attending: Cardiovascular Disease | Admitting: Cardiovascular Disease

## 2013-09-28 ENCOUNTER — Encounter (HOSPITAL_COMMUNITY): Payer: Self-pay | Admitting: Emergency Medicine

## 2013-09-28 ENCOUNTER — Encounter (HOSPITAL_COMMUNITY)
Admission: EM | Disposition: A | Discharge status: Home / Self Care | Payer: Self-pay | Source: Home / Self Care | Attending: Cardiovascular Disease

## 2013-09-28 ENCOUNTER — Emergency Department (HOSPITAL_COMMUNITY): Payer: Medicare HMO

## 2013-09-28 DIAGNOSIS — I447 Left bundle-branch block, unspecified: Secondary | ICD-10-CM | POA: Diagnosis present

## 2013-09-28 DIAGNOSIS — R079 Chest pain, unspecified: Secondary | ICD-10-CM

## 2013-09-28 DIAGNOSIS — F3289 Other specified depressive episodes: Secondary | ICD-10-CM | POA: Diagnosis present

## 2013-09-28 DIAGNOSIS — I10 Essential (primary) hypertension: Secondary | ICD-10-CM | POA: Diagnosis present

## 2013-09-28 DIAGNOSIS — Z79899 Other long term (current) drug therapy: Secondary | ICD-10-CM

## 2013-09-28 DIAGNOSIS — I5043 Acute on chronic combined systolic (congestive) and diastolic (congestive) heart failure: Secondary | ICD-10-CM | POA: Diagnosis present

## 2013-09-28 DIAGNOSIS — Z794 Long term (current) use of insulin: Secondary | ICD-10-CM | POA: Diagnosis not present

## 2013-09-28 DIAGNOSIS — I252 Old myocardial infarction: Secondary | ICD-10-CM

## 2013-09-28 DIAGNOSIS — F329 Major depressive disorder, single episode, unspecified: Secondary | ICD-10-CM | POA: Diagnosis present

## 2013-09-28 DIAGNOSIS — I249 Acute ischemic heart disease, unspecified: Secondary | ICD-10-CM

## 2013-09-28 DIAGNOSIS — Z951 Presence of aortocoronary bypass graft: Secondary | ICD-10-CM

## 2013-09-28 DIAGNOSIS — Z9119 Patient's noncompliance with other medical treatment and regimen: Secondary | ICD-10-CM

## 2013-09-28 DIAGNOSIS — Z91199 Patient's noncompliance with other medical treatment and regimen due to unspecified reason: Secondary | ICD-10-CM | POA: Diagnosis not present

## 2013-09-28 DIAGNOSIS — Z9861 Coronary angioplasty status: Secondary | ICD-10-CM

## 2013-09-28 DIAGNOSIS — F17200 Nicotine dependence, unspecified, uncomplicated: Secondary | ICD-10-CM | POA: Diagnosis present

## 2013-09-28 DIAGNOSIS — E7800 Pure hypercholesterolemia, unspecified: Secondary | ICD-10-CM | POA: Diagnosis present

## 2013-09-28 DIAGNOSIS — I509 Heart failure, unspecified: Secondary | ICD-10-CM | POA: Diagnosis present

## 2013-09-28 DIAGNOSIS — Z7982 Long term (current) use of aspirin: Secondary | ICD-10-CM | POA: Diagnosis not present

## 2013-09-28 DIAGNOSIS — E785 Hyperlipidemia, unspecified: Secondary | ICD-10-CM

## 2013-09-28 DIAGNOSIS — I200 Unstable angina: Secondary | ICD-10-CM | POA: Diagnosis present

## 2013-09-28 DIAGNOSIS — I359 Nonrheumatic aortic valve disorder, unspecified: Secondary | ICD-10-CM

## 2013-09-28 DIAGNOSIS — Z833 Family history of diabetes mellitus: Secondary | ICD-10-CM | POA: Diagnosis not present

## 2013-09-28 DIAGNOSIS — E119 Type 2 diabetes mellitus without complications: Secondary | ICD-10-CM

## 2013-09-28 DIAGNOSIS — I161 Hypertensive emergency: Secondary | ICD-10-CM

## 2013-09-28 DIAGNOSIS — I2510 Atherosclerotic heart disease of native coronary artery without angina pectoris: Secondary | ICD-10-CM | POA: Diagnosis present

## 2013-09-28 DIAGNOSIS — Z8249 Family history of ischemic heart disease and other diseases of the circulatory system: Secondary | ICD-10-CM

## 2013-09-28 DIAGNOSIS — E876 Hypokalemia: Secondary | ICD-10-CM | POA: Diagnosis present

## 2013-09-28 HISTORY — DX: Hyperlipidemia, unspecified: E78.5

## 2013-09-28 HISTORY — PX: LEFT HEART CATHETERIZATION WITH CORONARY ANGIOGRAM: SHX5451

## 2013-09-28 HISTORY — DX: Chronic combined systolic (congestive) and diastolic (congestive) heart failure: I50.42

## 2013-09-28 LAB — BASIC METABOLIC PANEL
BUN: 12 mg/dL (ref 6–23)
BUN: 12 mg/dL (ref 6–23)
CALCIUM: 9.3 mg/dL (ref 8.4–10.5)
CHLORIDE: 100 meq/L (ref 96–112)
CO2: 24 mEq/L (ref 19–32)
CO2: 26 meq/L (ref 19–32)
CREATININE: 0.94 mg/dL (ref 0.50–1.10)
Calcium: 9.1 mg/dL (ref 8.4–10.5)
Chloride: 97 mEq/L (ref 96–112)
Creatinine, Ser: 0.9 mg/dL (ref 0.50–1.10)
GFR calc Af Amer: 78 mL/min — ABNORMAL LOW (ref >90)
GFR calc non Af Amer: 67 mL/min — ABNORMAL LOW (ref >90)
GFR, EST AFRICAN AMERICAN: 82 mL/min — AB (ref >90)
GFR, EST NON AFRICAN AMERICAN: 71 mL/min — AB (ref >90)
GLUCOSE: 187 mg/dL — AB (ref 70–99)
Glucose, Bld: 163 mg/dL — ABNORMAL HIGH (ref 70–99)
Potassium: 3.8 mEq/L (ref 3.7–5.3)
Potassium: 3.8 mEq/L (ref 3.7–5.3)
SODIUM: 137 meq/L (ref 137–147)
SODIUM: 139 meq/L (ref 137–147)

## 2013-09-28 LAB — CBC WITH DIFFERENTIAL/PLATELET
BASOS ABS: 0 10*3/uL (ref 0.0–0.1)
BASOS PCT: 0 % (ref 0–1)
EOS ABS: 0.1 10*3/uL (ref 0.0–0.7)
EOS PCT: 1 % (ref 0–5)
HCT: 38 % (ref 36.0–46.0)
Hemoglobin: 12.5 g/dL (ref 12.0–15.0)
LYMPHS ABS: 2.2 10*3/uL (ref 0.7–4.0)
Lymphocytes Relative: 18 % (ref 12–46)
MCH: 30.6 pg (ref 26.0–34.0)
MCHC: 32.9 g/dL (ref 30.0–36.0)
MCV: 93.1 fL (ref 78.0–100.0)
Monocytes Absolute: 0.7 10*3/uL (ref 0.1–1.0)
Monocytes Relative: 5 % (ref 3–12)
Neutro Abs: 9.7 10*3/uL — ABNORMAL HIGH (ref 1.7–7.7)
Neutrophils Relative %: 76 % (ref 43–77)
PLATELETS: 310 10*3/uL (ref 150–400)
RBC: 4.08 MIL/uL (ref 3.87–5.11)
RDW: 15.5 % (ref 11.5–15.5)
WBC: 12.8 10*3/uL — ABNORMAL HIGH (ref 4.0–10.5)

## 2013-09-28 LAB — HEMOGLOBIN A1C
HEMOGLOBIN A1C: 9.1 % — AB (ref <5.7)
MEAN PLASMA GLUCOSE: 214 mg/dL — AB (ref <117)

## 2013-09-28 LAB — HEPATIC FUNCTION PANEL
ALBUMIN: 3.2 g/dL — AB (ref 3.5–5.2)
ALK PHOS: 92 U/L (ref 39–117)
ALT: 7 U/L (ref 0–35)
AST: 12 U/L (ref 0–37)
Bilirubin, Direct: <0.2 mg/dL (ref 0.0–0.3)
TOTAL PROTEIN: 6.9 g/dL (ref 6.0–8.3)
Total Bilirubin: 0.5 mg/dL (ref 0.3–1.2)

## 2013-09-28 LAB — CBC
HCT: 35.2 % — ABNORMAL LOW (ref 36.0–46.0)
Hemoglobin: 11.6 g/dL — ABNORMAL LOW (ref 12.0–15.0)
MCH: 30.6 pg (ref 26.0–34.0)
MCHC: 33 g/dL (ref 30.0–36.0)
MCV: 92.9 fL (ref 78.0–100.0)
Platelets: 291 10*3/uL (ref 150–400)
RBC: 3.79 MIL/uL — AB (ref 3.87–5.11)
RDW: 15.6 % — ABNORMAL HIGH (ref 11.5–15.5)
WBC: 11.3 10*3/uL — ABNORMAL HIGH (ref 4.0–10.5)

## 2013-09-28 LAB — LIPID PANEL
CHOL/HDL RATIO: 6 ratio
Cholesterol: 210 mg/dL — ABNORMAL HIGH (ref 0–200)
HDL: 35 mg/dL — AB (ref >39)
LDL Cholesterol: 120 mg/dL — ABNORMAL HIGH (ref 0–99)
Triglycerides: 277 mg/dL — ABNORMAL HIGH (ref <150)
VLDL: 55 mg/dL — ABNORMAL HIGH (ref 0–40)

## 2013-09-28 LAB — POCT I-STAT 3, ART BLOOD GAS (G3+)
BICARBONATE: 24.5 meq/L — AB (ref 20.0–24.0)
O2 Saturation: 91 %
PO2 ART: 61 mmHg — AB (ref 80.0–100.0)
TCO2: 26 mmol/L (ref 0–100)
pCO2 arterial: 39.1 mmHg (ref 35.0–45.0)
pH, Arterial: 7.406 (ref 7.350–7.450)

## 2013-09-28 LAB — GLUCOSE, CAPILLARY
GLUCOSE-CAPILLARY: 224 mg/dL — AB (ref 70–99)
GLUCOSE-CAPILLARY: 149 mg/dL — AB (ref 70–99)
Glucose-Capillary: 172 mg/dL — ABNORMAL HIGH (ref 70–99)
Glucose-Capillary: 172 mg/dL — ABNORMAL HIGH (ref 70–99)
Glucose-Capillary: 194 mg/dL — ABNORMAL HIGH (ref 70–99)

## 2013-09-28 LAB — POCT I-STAT, CHEM 8
BUN: 10 mg/dL (ref 6–23)
CALCIUM ION: 1.19 mmol/L (ref 1.12–1.23)
CHLORIDE: 98 meq/L (ref 96–112)
CREATININE: 0.9 mg/dL (ref 0.50–1.10)
GLUCOSE: 202 mg/dL — AB (ref 70–99)
HCT: 38 % (ref 36.0–46.0)
Hemoglobin: 12.9 g/dL (ref 12.0–15.0)
POTASSIUM: 3.2 meq/L — AB (ref 3.7–5.3)
Sodium: 137 mEq/L (ref 137–147)
TCO2: 21 mmol/L (ref 0–100)

## 2013-09-28 LAB — PRO B NATRIURETIC PEPTIDE: PRO B NATRI PEPTIDE: 2302 pg/mL — AB (ref 0–125)

## 2013-09-28 LAB — TROPONIN I
Troponin I: <0.3 ng/mL (ref <0.30)
Troponin I: <0.3 ng/mL (ref <0.30)

## 2013-09-28 LAB — MRSA PCR SCREENING: MRSA BY PCR: NEGATIVE

## 2013-09-28 SURGERY — LEFT HEART CATHETERIZATION WITH CORONARY ANGIOGRAM
Anesthesia: LOCAL

## 2013-09-28 MED ORDER — ISOSORBIDE MONONITRATE ER 60 MG PO TB24
60.0000 mg | ORAL_TABLET | Freq: Every day | ORAL | Status: DC
  Administered 2013-09-28 – 2013-09-30 (×3): 60 mg via ORAL
  Filled 2013-09-28 (×3): qty 1

## 2013-09-28 MED ORDER — INSULIN ASPART 100 UNIT/ML ~~LOC~~ SOLN: 0.0000 [IU] | Freq: Three times a day (TID) | SUBCUTANEOUS | Status: DC

## 2013-09-28 MED ORDER — SODIUM CHLORIDE 0.9 % IJ SOLN
3.0000 mL | Freq: Two times a day (BID) | INTRAMUSCULAR | Status: DC
  Administered 2013-09-28 – 2013-09-30 (×3): 3 mL via INTRAVENOUS

## 2013-09-28 MED ORDER — SIMVASTATIN 40 MG PO TABS
40.0000 mg | ORAL_TABLET | Freq: Every day | ORAL | Status: DC
  Administered 2013-09-28 – 2013-09-29 (×2): 40 mg via ORAL
  Filled 2013-09-28 (×3): qty 1

## 2013-09-28 MED ORDER — MIDAZOLAM HCL 2 MG/2ML IJ SOLN
INTRAMUSCULAR | Status: AC
  Filled 2013-09-28: qty 2

## 2013-09-28 MED ORDER — CARVEDILOL 25 MG PO TABS
25.0000 mg | ORAL_TABLET | Freq: Two times a day (BID) | ORAL | Status: DC
  Administered 2013-09-28 – 2013-09-30 (×6): 25 mg via ORAL
  Filled 2013-09-28 (×8): qty 1

## 2013-09-28 MED ORDER — HEPARIN (PORCINE) IN NACL 2-0.9 UNIT/ML-% IJ SOLN
INTRAMUSCULAR | Status: AC
  Filled 2013-09-28: qty 1000

## 2013-09-28 MED ORDER — HEPARIN (PORCINE) IN NACL 100-0.45 UNIT/ML-% IJ SOLN
900.0000 [IU]/h | INTRAMUSCULAR | Status: DC
  Filled 2013-09-28: qty 250

## 2013-09-28 MED ORDER — NITROGLYCERIN IN D5W 200-5 MCG/ML-% IV SOLN
INTRAVENOUS | Status: AC
  Administered 2013-09-28: 20 ug/min via INTRAVENOUS
  Filled 2013-09-28: qty 250

## 2013-09-28 MED ORDER — NITROGLYCERIN IN D5W 200-5 MCG/ML-% IV SOLN
20.0000 ug/min | INTRAVENOUS | Status: DC
  Administered 2013-09-28: 20 ug/min via INTRAVENOUS

## 2013-09-28 MED ORDER — LISINOPRIL 20 MG PO TABS
20.0000 mg | ORAL_TABLET | Freq: Every day | ORAL | Status: DC
  Administered 2013-09-28 – 2013-09-30 (×3): 20 mg via ORAL
  Filled 2013-09-28 (×3): qty 1

## 2013-09-28 MED ORDER — AMITRIPTYLINE HCL 25 MG PO TABS
25.0000 mg | ORAL_TABLET | Freq: Every day | ORAL | Status: DC
  Administered 2013-09-28: 25 mg via ORAL
  Filled 2013-09-28 (×3): qty 1

## 2013-09-28 MED ORDER — FUROSEMIDE 10 MG/ML IJ SOLN
40.0000 mg | Freq: Two times a day (BID) | INTRAMUSCULAR | Status: DC
  Administered 2013-09-28 – 2013-09-29 (×2): 40 mg via INTRAVENOUS
  Filled 2013-09-28 (×5): qty 4

## 2013-09-28 MED ORDER — INSULIN ASPART 100 UNIT/ML ~~LOC~~ SOLN
0.0000 [IU] | Freq: Every day | SUBCUTANEOUS | Status: DC
Start: 2013-09-28 — End: 2013-09-30
  Administered 2013-09-28: 2 [IU] via SUBCUTANEOUS

## 2013-09-28 MED ORDER — CLOPIDOGREL BISULFATE 300 MG PO TABS
600.0000 mg | ORAL_TABLET | Freq: Once | ORAL | Status: DC
Start: 2013-09-28 — End: 2013-09-28
  Filled 2013-09-28: qty 2

## 2013-09-28 MED ORDER — MORPHINE SULFATE 2 MG/ML IJ SOLN: 2.0000 mg | INTRAMUSCULAR | Status: DC | PRN

## 2013-09-28 MED ORDER — VERAPAMIL HCL 2.5 MG/ML IV SOLN
INTRAVENOUS | Status: AC
  Filled 2013-09-28: qty 2

## 2013-09-28 MED ORDER — FUROSEMIDE 10 MG/ML IJ SOLN
INTRAMUSCULAR | Status: AC
  Administered 2013-09-28: 40 mg
  Filled 2013-09-28: qty 4

## 2013-09-28 MED ORDER — ASPIRIN EC 81 MG PO TBEC
81.0000 mg | DELAYED_RELEASE_TABLET | Freq: Every day | ORAL | Status: DC
  Administered 2013-09-28 – 2013-09-30 (×3): 81 mg via ORAL
  Filled 2013-09-28 (×3): qty 1

## 2013-09-28 MED ORDER — SODIUM CHLORIDE 0.9 % IV SOLN: 250.0000 mL | INTRAVENOUS | Status: DC | PRN

## 2013-09-28 MED ORDER — HEPARIN SODIUM (PORCINE) 5000 UNIT/ML IJ SOLN
5000.0000 [IU] | Freq: Three times a day (TID) | INTRAMUSCULAR | Status: DC
  Administered 2013-09-28 – 2013-09-29 (×3): 5000 [IU] via SUBCUTANEOUS
  Filled 2013-09-28 (×10): qty 1

## 2013-09-28 MED ORDER — NITROGLYCERIN 0.2 MG/ML ON CALL CATH LAB
INTRAVENOUS | Status: AC
  Filled 2013-09-28: qty 1

## 2013-09-28 MED ORDER — SODIUM CHLORIDE 0.9 % IJ SOLN: 3.0000 mL | INTRAMUSCULAR | Status: DC | PRN

## 2013-09-28 MED ORDER — CLOPIDOGREL BISULFATE 75 MG PO TABS
75.0000 mg | ORAL_TABLET | Freq: Every day | ORAL | Status: DC
  Administered 2013-09-28 – 2013-09-30 (×3): 75 mg via ORAL
  Filled 2013-09-28 (×3): qty 1

## 2013-09-28 MED ORDER — LIDOCAINE HCL (PF) 1 % IJ SOLN
INTRAMUSCULAR | Status: AC
  Filled 2013-09-28: qty 30

## 2013-09-28 MED ORDER — ASPIRIN EC 81 MG PO TBEC
81.0000 mg | DELAYED_RELEASE_TABLET | Freq: Two times a day (BID) | ORAL | Status: DC
  Filled 2013-09-28 (×2): qty 1

## 2013-09-28 MED ORDER — HEPARIN BOLUS VIA INFUSION
4000.0000 [IU] | Freq: Once | INTRAVENOUS | Status: AC
  Administered 2013-09-28: 4000 [IU] via INTRAVENOUS
  Filled 2013-09-28: qty 4000

## 2013-09-28 MED ORDER — FENTANYL CITRATE 0.05 MG/ML IJ SOLN
INTRAMUSCULAR | Status: AC
  Filled 2013-09-28: qty 2

## 2013-09-28 MED ORDER — NITROGLYCERIN IN D5W 200-5 MCG/ML-% IV SOLN
3.0000 ug/min | INTRAVENOUS | Status: DC
  Administered 2013-09-28: 20 ug/min via INTRAVENOUS

## 2013-09-28 MED ORDER — SODIUM CHLORIDE 0.9 % IV SOLN
INTRAVENOUS | Status: AC
  Administered 2013-09-28: 02:00:00 via INTRAVENOUS

## 2013-09-28 MED ORDER — METOPROLOL TARTRATE 1 MG/ML IV SOLN
5.0000 mg | Freq: Once | INTRAVENOUS | Status: AC
  Administered 2013-09-28: 5 mg via INTRAVENOUS
  Filled 2013-09-28: qty 5

## 2013-09-28 NOTE — Care Management Note (Addendum)
    Page 1 of 1   09/30/2013     2:23:16 PM CARE MANAGEMENT NOTE 09/30/2013  Patient:  Tasha Myers, Tasha Myers   Account Number:  [id number]  Date Initiated:  09/28/2013  Documentation initiated by:  Elissa Hefty  Subjective/Objective Assessment:   adm w [facility]     Action/Plan:   lives w husband, pcp dr b aldridge   Anticipated DC Date:  09/30/2013   Anticipated DC Plan:  [place]  CM consult      Choice offered to / List presented to:             Status of service:  Completed, signed off Medicare Important Message given?  NA - LOS <3 / Initial given by admissions (If response is "NO", the following Medicare IM given date fields will be blank) Date Medicare IM given:   Date Additional Medicare IM given:    Discharge Disposition:  HOME/SELF CARE  Per UR Regulation:  Reviewed for med. necessity/level of care/duration of stay  If discussed at [place] of Stay Meetings, dates discussed:    Comments:

## 2013-09-28 NOTE — CV Procedure (Signed)
CARDIAC CATHETERIZATION REPORT  NAME:  Tasha Myers   MRN: [id number] DOB:  1960-11-30   ADMIT DATE: 09/28/2013 Procedure Date: 09/28/2013  INTERVENTIONAL CARDIOLOGIST: Leonie Man, M.D., MS PRIMARY CARE PROVIDER: Gayland Curry, MD PRIMARY CARDIOLOGIST: Ida Rogue, M.D.  PATIENT:  Tasha Myers is a 53 y.o. female prior history of CAD who presented with signs and symptoms of acute heart failure and malignant hypertension with possible acute coronary syndrome. She has a baseline left bundle branch block which makes EKG changes difficult to detect. She did have some transient ST elevations in the precordial leads not previously seen. Because she continued to have persistent chest pain and new onset heart failure with pulmonary edema on chest x-ray (Killip Class III) the decision was made to proceed with urgent cardiac catheterization to delineate her coronary anatomy and evaluate LVEDP post-diuresis and aggressive blood pressure control. She was given 4000 units of heparin, 5 mg IV Lopressor and 40 mg IV Lasix in addition to initiation of IV nitroglycerin drip at 20 mcg per minute. The EMS she received 3 sublingual nitroglycerin, nitroglycerin glycerin paste and 324 mg of aspirin.  PRE-OPERATIVE DIAGNOSIS:    Acute Coronary Syndrome - possible non-STEMI versus STEMI  Acute Heart Failure At least Diastolic if not combined Diastolic plus Systolic (EF unknown at time of admission)  PROCEDURES PERFORMED:    Left Heart Catheterization with Native Coronary Angiography  via Right Radial Artery   Left Ventriculography  PROCEDURE: The patient was brought to the [facility] in the fasting state and prepped and draped in the usual sterile fashion for Right Radial artery access. A modified Allen's test was performed on the Right wrist demonstrating excellent collateral flow for radial access.   Sterile technique was used including antiseptics, cap,  gloves, gown, hand hygiene, mask and sheet. Skin prep: Chlorhexidine.   Consent: Risks of procedure as well as the alternatives and risks of each were explained to the (patient/caregiver). Consent for procedure obtained.   Time Out: Verified patient identification, verified procedure, site/side was marked, verified correct patient position, special equipment/implants available, medications/allergies/relevent history reviewed, required imaging and test results available. Performed.  Access:   Right Radial Artery: 6 Fr Sheath -  Seldinger Technique (Angiocath Micropuncture Kit)  Radial Cocktail - 10 mL;   Left Heart Catheterization: 5 Fr Catheters advanced or exchanged over a Long Exchange Safety J-wire into the ascending aorta. Left & Right Coronary Artery Cineangiography: TIG 4.0 Catheter   LV Hemodynamics (LV Gram): TIG 4.0 catheter was advanced across the aortic valve over wire and exchanged for the Angled Pigtail Catheter  Sheath removed in the Cath Lab with a TR band applied for hemostasis.  TR Band: 0100  Hours; 12 mL air  FINDINGS:  Hemodynamics: Blood pressure in the ER prior to arrival in the Cath Lab as 170/90 mm Hg; pressures measured in the Cath Lab where following radial cocktail administration and medications given in the ER  Central Aortic Pressure / Mean: 122/69/91 mmHg  Left Ventricular Pressure / LVEDP: 126/12/23 mmHg  Left Ventriculography:  EF: 30-35 %  Wall Motion: Diffuse global hypokinesis with more notable basal to mid inferior- inferolateral wall  Coronary Anatomy:  Dominance: Right  Left Main: Large-caliber vessel that trifurcates into the LAD, Ramus Intermedius, and Circumflex. Angiographically normal. LAD: Normal caliber vessel which gives rise to several diagonal branches is very proximal with 2 in the mid LAD. There is a roughly 40% lesion in the mid LAD  it reaches down around the apex. Otherwise only mild to moderate luminal irregularities  noted.  Left Circumflex: Normal vessel that small marginal branch before bifurcates into a lateral OM and the AV groove circumflex which gives off 2 posterolateral branches. Mild luminal irregularities throughout  Ramus intermedius: Small to moderate caliber vessel that is relatively short period; diffuse mild luminal irregularities   RCA: Normal caliber dominant vessel with a barely visible proximal stent and it has maybe 20% ISR. There is a high bifurcation into the  Right Posterior AV Groove Branch (RPAV) and the Right Posterior Descending Artery that gives off a major branch distally. Minimal luminal irregularity throughout the entire RCA system   RPL Sysytem:The RPAV is a long branch that gives off 3 major and several smaller posterior lateral branches.  MEDICATIONS:  Anesthesia:  Local Lidocaine 2 ml  Sedation:  2 mg IV Versed, 50 mcg IV fentanyl ;   Premedication:   EMS: 100 mcg IV fentanyl; 3 sublingual nitroglycerin; nitro paste; 324 mg aspirin  ER: 4000 units IV heparin, 5 mg IV Lopressor, 40 mg IV Lasix  Omnipaque Contrast: 90 ml  Anticoagulation:  No additional heparin given Radial Cocktail: 5 mg Verapamil, 400 mcg NTG, 2 ml 2% Lidocaine in 10 ml NS  PATIENT DISPOSITION:    The patient was transferred to the PACU holding area in a hemodynamicaly stable, chest pain free condition.  The patient tolerated the procedure well, and there were no complications.  EBL:   < 5 ml  The patient was stable before, during, and after the procedure.  POST-OPERATIVE DIAGNOSIS:    Angiographically, only mild coronary disease with widely patent stent in the RCA.  Moderate to severely reduced LVEF of roughly 30-35%.  Presumed diagnoses would be Malignant Hypertension With Acute on Chronic Combined Systolic and Diastolic Heart Failure  PLAN OF CARE:  Admit to TCU overnight with IV nitroglycerin infusion. Will dose of IV Lasix twice a day starting tomorrow.  Check 2-D  echocardiogram  Continue home dose of beta blocker and ACE inhibitor, but discontinue HCTZ and noted to use furosemide plus or minus spironolactone.   Leonie Man, M.D., M.S. [facility] [address] [place], [place]  [address]  [phone]  09/28/2013 1:31 AM

## 2013-09-28 NOTE — H&P (Signed)
Cardiology H&Pnote   Patient ID: Tasha Myers, MRN: [id number], DOB/AGE: 05-29-1960 53 y.o. Admit date: 09/28/2013   Date of Consult: 09/28/2013 Primary Physician: Gayland Curry, MD Primary Cardiologist: nill - currently not following anyone   Chief Complaint: chest pressure     Assessment and Plan:  ACS with acute onset chest pain  Decompensated heart failure  HTN - uncontrolled  DM type II   Plan  Dr Ellyn Hack at bedside . Plan for emergent LHC to eval for possible ACS Will start nitro gtt ,  Given lasix 40IV X1, will assess response and schedule lasix accordingly  Check Echo, B-blocker once more compensated  Cont ACE , HCTZ , aspirin and plavix   53 year old woma, patient of Dr. Dema Severin, with history of coronary artery disease, inferior wall myocardial infarction s/p percutaneous transluminal coronary angioplasty and stenting to proximal RCA in July 2000, at [facility], Hypertension, Insulin-requiring diabetes mellitus, Hypercholesterolemia, Tobacco abuse here with chest pain   Pt states that she has dull substernal chest pressure for the past 2 hours . She has also been having worsening SOB , orthopnea, PND over the past 3 days. Reports medication compliance. Denies any LE edema ,focal weakness, syncope, bleeding diathesis , claudication , palpitation etc . Reports medication compliance,. States that she has not followed with her heart doctor for more than a year for reason unclear to her.  In the ED pt was noted to be moderate respiratory distress with Elevated BP in the 180's . EKG shows LBBB and sinus tach.    Past Medical History  Diagnosis Date  . Diabetes mellitus   . Hypertension   . Thoracic or lumbosacral neuritis or radiculitis, unspecified   . CAD S/P percutaneous coronary angioplasty     s/p BMS stent to RCA ~2000; PTCA of ISR in 2001  . Heart failure   . Insomnia       Surgical History:  Past Surgical History  Procedure Laterality Date  . Lymph node resection     . Coronary artery bypass graft    . Partial hysterectomy    . Carpal tunnel release      BILATERAL     Home Meds: Prior to Admission medications   Medication Sig Start Date End Date Taking? Authorizing Provider  amitriptyline (ELAVIL) 50 MG tablet Takes 2 tablets nightly.    Historical Provider, MD  aspirin EC 81 MG tablet Take 81 mg by mouth 2 (two) times daily.    Historical Provider, MD  carvedilol (COREG) 25 MG tablet Take 25 mg by mouth 2 (two) times daily with a meal.      Historical Provider, MD  clopidogrel (PLAVIX) 75 MG tablet Take 75 mg by mouth daily.      Historical Provider, MD  cyanocobalamin 1000 MCG tablet Take 100 mcg by mouth daily.    Historical Provider, MD  furosemide (LASIX) 20 MG tablet Take 20 mg by mouth daily.     Historical Provider, MD  glipiZIDE (GLUCOTROL XL) 5 MG 24 hr tablet Take 5 mg by mouth daily.    Historical Provider, MD  isosorbide mononitrate (IMDUR) 30 MG 24 hr tablet Take 30 mg by mouth daily.  10/24/10 10/24/11  Minna Merritts, MD  lisinopril-hydrochlorothiazide (PRINZIDE,ZESTORETIC) 20-25 MG per tablet Take 1 tablet by mouth daily.    Historical Provider, MD  metFORMIN (GLUCOPHAGE) 1000 MG tablet Take 1,000 mg by mouth 2 (two) times daily with a meal.    Historical Provider, MD  nitroGLYCERIN (NITROSTAT) 0.4  MG SL tablet Place 0.4 mg under the tongue every 5 (five) minutes as needed.      Historical Provider, MD  pravastatin (PRAVACHOL) 40 MG tablet Take 40 mg by mouth at bedtime.    Historical Provider, MD  pregabalin (LYRICA) 150 MG capsule Take 150 mg by mouth 2 (two) times daily.    Historical Provider, MD  Varenicline Tartrate (CHANTIX CONTINUING MONTH PAK PO) Take by mouth. Daily as directed.    Historical Provider, MD    Inpatient Medications:    . [facility]] nitroGLYCERIN 20 mcg/min (09/28/13 0016)    Allergies: No Known Allergies  History   Social History  . Marital Status: Married    Spouse Name: N/A    Number of Children:  N/A  . Years of Education: N/A   Occupational History  . Not on file.   Social History Main Topics  . Smoking status: Current Every Day Smoker -- 1.00 packs/day for 40 years    Types: Cigarettes  . Smokeless tobacco: Never Used  . Alcohol Use: 1.2 oz/week    2 Cans of beer per week  . Drug Use: No  . Sexual Activity: Not on file   Other Topics Concern  . Not on file   Social History Narrative  . No narrative on file     Family History  Problem Relation Age of Onset  . Coronary artery disease Mother   . Hypertension Mother   . Diabetes Father   . Stroke Mother   . Stroke      sibling  . Coronary artery disease      sibling  . Diabetes      sibling  . Hypertension      sibling     Review of Systems: General: negative for chills, fever, night sweats or weight changes.  Cardiovascular: see HPI  Dermatological: negative for rash Respiratory: negative for cough or wheezing Urologic: negative for hematuria Abdominal: negative for nausea, vomiting, diarrhea, bright red blood per rectum, melena, or hematemesis Neurologic: negative for visual changes, syncope, or dizziness All other systems reviewed and are otherwise negative except as noted above.  Labs: No results found for this basename: CKTOTAL, CKMB, TROPONINI,  in the last 72 hours Lab Results  Component Value Date   WBC 12.8* 09/28/2013   HGB 12.5 09/28/2013   HCT 38.0 09/28/2013   MCV 93.1 09/28/2013   PLT 310 09/28/2013   Cr 0.90 Trop I <0.30    Radiology/Studies:  No results found.  EKG: Sinus tach with LBBB. The LBBB was also seen in EKG from 11/13/2011   Physical Exam: Blood pressure 176/95, pulse 99, temperature 98.9 F (37.2 C), temperature source Oral, resp. rate 24, SpO2 92.00%. General: moderate respiratory distress  Neck: Negative for carotid bruits. JVD not elevated. Lungs: labored, bibasilar crackles and wheezing  Heart: RRR with S1 S2. No murmurs, rubs, or gallops appreciated. Abdomen:  Soft, non-tender, non-distended with normoactive bowel sounds. No hepatomegaly. No rebound/guarding. No obvious abdominal masses. Extremities: No clubbing or cyanosis. No edema.  Distal pedal pulses are 2+ and equal bilaterally. Neuro: Alert and oriented X 3. No facial asymmetry. No focal deficit. Moves all extremities spontaneously. Psych:  Responds to questions appropriately with a normal affect.       Cory Roughen, A M.D  09/28/2013, 12:25 AM

## 2013-09-28 NOTE — Progress Notes (Signed)
Subjective:  Pt feeling better this AM. No CP and SOB improved  Objective:  Temp:  [98.5 F (36.9 C)-98.9 F (37.2 C)] 98.5 F (36.9 C) (06/15 0127) Pulse Rate:  [71-113] 71 (06/15 0700) Resp:  [12-35] 14 (06/15 0700) BP: (120-188)/(45-158) 139/60 mmHg (06/15 0700) SpO2:  [91 %-99 %] 99 % (06/15 0700) Weight:  [177 lb 0.5 oz (80.3 kg)] 177 lb 0.5 oz (80.3 kg) (06/15 0127) Weight change:   Intake/Output from previous day: 06/14 0701 - 06/15 0700 In: 478.3 [P.O.:120; I.V.:358.3] Out: 2850 [Urine:2850]  Intake/Output from this shift:    Physical Exam: General appearance: alert and no distress Neck: no adenopathy, no carotid bruit, no JVD, supple, symmetrical, trachea midline and thyroid not enlarged, symmetric, no tenderness/mass/nodules Lungs: scattered rales right base Heart: regular rate and rhythm, S1, S2 normal, no murmur, click, rub or gallop Extremities: extremities normal, atraumatic, no cyanosis or edema  Lab Results: Results for orders placed during the hospital encounter of 09/28/13 (from the past 48 hour(s))  CBC WITH DIFFERENTIAL     Status: Abnormal   Collection Time    09/28/13 12:11 AM      Result Value Ref Range   WBC 12.8 (*) 4.0 - 10.5 K/uL   RBC 4.08  3.87 - 5.11 MIL/uL   Hemoglobin 12.5  12.0 - 15.0 g/dL   HCT 38.0  36.0 - 46.0 %   MCV 93.1  78.0 - 100.0 fL   MCH 30.6  26.0 - 34.0 pg   MCHC 32.9  30.0 - 36.0 g/dL   RDW 15.5  11.5 - 15.5 %   Platelets 310  150 - 400 K/uL   Neutrophils Relative % 76  43 - 77 %   Neutro Abs 9.7 (*) 1.7 - 7.7 K/uL   Lymphocytes Relative 18  12 - 46 %   Lymphs Abs 2.2  0.7 - 4.0 K/uL   Monocytes Relative 5  3 - 12 %   Monocytes Absolute 0.7  0.1 - 1.0 K/uL   Eosinophils Relative 1  0 - 5 %   Eosinophils Absolute 0.1  0.0 - 0.7 K/uL   Basophils Relative 0  0 - 1 %   Basophils Absolute 0.0  0.0 - 0.1 K/uL  BASIC METABOLIC PANEL     Status: Abnormal   Collection Time    09/28/13 12:11 AM      Result Value  Ref Range   Sodium 137  137 - 147 mEq/L   Potassium 3.8  3.7 - 5.3 mEq/L   Chloride 97  96 - 112 mEq/L   CO2 24  19 - 32 mEq/L   Glucose, Bld 187 (*) 70 - 99 mg/dL   BUN 12  6 - 23 mg/dL   Creatinine, Ser 0.90  0.50 - 1.10 mg/dL   Calcium 9.3  8.4 - 10.5 mg/dL   GFR calc non Af Amer 71 (*) >90 mL/min   GFR calc Af Amer 82 (*) >90 mL/min   Comment: (NOTE)     The eGFR has been calculated using the CKD EPI equation.     This calculation has not been validated in all clinical situations.     eGFR's persistently <90 mL/min signify possible Chronic Kidney     Disease.  TROPONIN I     Status: None   Collection Time    09/28/13 12:11 AM      Result Value Ref Range   Troponin I <0.30  <0.30 ng/mL  Comment:            Due to the release kinetics of cTnI,     a negative result within the first hours     of the onset of symptoms does not rule out     myocardial infarction with certainty.     If myocardial infarction is still suspected,     repeat the test at appropriate intervals.  HEPATIC FUNCTION PANEL     Status: Abnormal   Collection Time    09/28/13 12:50 AM      Result Value Ref Range   Total Protein 6.9  6.0 - 8.3 g/dL   Albumin 3.2 (*) 3.5 - 5.2 g/dL   AST 12  0 - 37 U/L   ALT 7  0 - 35 U/L   Alkaline Phosphatase 92  39 - 117 U/L   Total Bilirubin 0.5  0.3 - 1.2 mg/dL   Bilirubin, Direct <0.2  0.0 - 0.3 mg/dL   Indirect Bilirubin NOT CALCULATED  0.3 - 0.9 mg/dL  PRO B NATRIURETIC PEPTIDE     Status: Abnormal   Collection Time    09/28/13 12:50 AM      Result Value Ref Range   Pro B Natriuretic peptide (BNP) 2302.0 (*) 0 - 125 pg/mL  MRSA PCR SCREENING     Status: None   Collection Time    09/28/13  1:29 AM      Result Value Ref Range   MRSA by PCR NEGATIVE  NEGATIVE   Comment:            The GeneXpert MRSA Assay (FDA     approved for NASAL specimens     only), is one component of a     comprehensive MRSA colonization     surveillance program. It is not      intended to diagnose MRSA     infection nor to guide or     monitor treatment for     MRSA infections.  GLUCOSE, CAPILLARY     Status: Abnormal   Collection Time    09/28/13  1:32 AM      Result Value Ref Range   Glucose-Capillary 224 (*) 70 - 99 mg/dL  TROPONIN I     Status: None   Collection Time    09/28/13  5:20 AM      Result Value Ref Range   Troponin I <0.30  <0.30 ng/mL   Comment:            Due to the release kinetics of cTnI,     a negative result within the first hours     of the onset of symptoms does not rule out     myocardial infarction with certainty.     If myocardial infarction is still suspected,     repeat the test at appropriate intervals.  CBC     Status: Abnormal   Collection Time    09/28/13  5:20 AM      Result Value Ref Range   WBC 11.3 (*) 4.0 - 10.5 K/uL   RBC 3.79 (*) 3.87 - 5.11 MIL/uL   Hemoglobin 11.6 (*) 12.0 - 15.0 g/dL   HCT 35.2 (*) 36.0 - 46.0 %   MCV 92.9  78.0 - 100.0 fL   MCH 30.6  26.0 - 34.0 pg   MCHC 33.0  30.0 - 36.0 g/dL   RDW 15.6 (*) 11.5 - 15.5 %   Platelets 291  150 - 400 K/uL  BASIC METABOLIC PANEL     Status: Abnormal   Collection Time    09/28/13  5:20 AM      Result Value Ref Range   Sodium 139  137 - 147 mEq/L   Potassium 3.8  3.7 - 5.3 mEq/L   Chloride 100  96 - 112 mEq/L   CO2 26  19 - 32 mEq/L   Glucose, Bld 163 (*) 70 - 99 mg/dL   BUN 12  6 - 23 mg/dL   Creatinine, Ser 0.94  0.50 - 1.10 mg/dL   Calcium 9.1  8.4 - 10.5 mg/dL   GFR calc non Af Amer 67 (*) >90 mL/min   GFR calc Af Amer 78 (*) >90 mL/min   Comment: (NOTE)     The eGFR has been calculated using the CKD EPI equation.     This calculation has not been validated in all clinical situations.     eGFR's persistently <90 mL/min signify possible Chronic Kidney     Disease.  LIPID PANEL     Status: Abnormal   Collection Time    09/28/13  5:20 AM      Result Value Ref Range   Cholesterol 210 (*) 0 - 200 mg/dL   Triglycerides 277 (*) <150 mg/dL   HDL  35 (*) >39 mg/dL   Total CHOL/HDL Ratio 6.0     VLDL 55 (*) 0 - 40 mg/dL   LDL Cholesterol 120 (*) 0 - 99 mg/dL   Comment:            Total Cholesterol/HDL:CHD Risk     Coronary Heart Disease Risk Table                         Men   Women      1/2 Average Risk   3.4   3.3      Average Risk       5.0   4.4      2 X Average Risk   9.6   7.1      3 X Average Risk  23.4   11.0                Use the calculated Patient Ratio     above and the CHD Risk Table     to determine the patient's CHD Risk.                ATP III CLASSIFICATION (LDL):      <100     mg/dL   Optimal      100-129  mg/dL   Near or Above                        Optimal      130-159  mg/dL   Borderline      160-189  mg/dL   High      >190     mg/dL   Very High    Imaging: Imaging results have been reviewed  Assessment/Plan:   1. Active Problems: 2.   ACS (acute coronary syndrome) 3.   Malignant hypertension 4. LV Dysfunction 5. Acute of Chronic Systolic CHF  Time Spent Directly with Patient:  20 minutes  Length of Stay:  LOS: 0 days   Pt admitted early AM with HTN , CP and CHF. Remote H/O RCA stenting at [facility] 10  Years ago. Radial cath by Dr. Ellyn Hack showed minimal CAD with severe  LV dysfunction. On IV NTG. BP under better control. Good diuresis. Exam benign. BNP was elevated at 2300. Pt admits to medication non compliance and dietary indiscretion. Will restart long acting oral nitrate and increase dose. Wean IV NTG. On BB and ACE-I as well as IV lasix for now. Tx to step down. Transition to PO meds tomorrow and prob out to tele. Anticipate D/C home Wed.  Tasha Myers 09/28/2013, 7:59 AM

## 2013-09-28 NOTE — ED Notes (Signed)
Patient being transported to cath lab at this time on zoll by Randall Hiss, [place]. Patient remains A&Ox4. Speaking in clear and complete sentences.

## 2013-09-28 NOTE — H&P (Signed)
History and Physical Interval Note:  NAME:  Tasha Myers   MRN: [id number] DOB:  1960-10-28   ADMIT DATE: 09/28/2013  09/28/2013 1:12 AM  Tasha Myers is a 53 y.o. female with a known history of CAD status post PCI back in 2000 with ISR treated with PTCA 2001 here. She was last seen by Tasha Myers in July 2013. She has hypertension, diabetes and a report of heart failure although there is no recent record of EF evaluation. She began having chest discomfort and dyspnea significantly this morning but over last few weeks has been having less urine output with her diuretic dosing. Today he she also noted dyspnea the orthopnea as well as increasing edema, but by 9:00 at night she was also having significant 8-9/10 chest discomfort associated with it. She finally called EMS upon arrival an EKG showed known left bundle branch block but with previously unseen ST segment elevations in the leads V3 -V5 that had not been seen previously. Code STEMI was called due to the patient's persistent pain. Also notable on arrival of EMS was at her blood pressures were in the [id number] systolic. After she received 100 of fentanyl and at least 2 doses sublingual nitroglycerin plus nitro paste, her pain did come down some and her blood pressures were still in the 190s. Upon arrival to [facility], the aforementioned ST segment elevations are no longer present her pain was down to a 5/10. Her blood pressures were still elevated but responded nicely to IV Lopressor. She was found to have rales in both bases with significant pulmonary edema on chest x-ray. She she was given 40 mg of IV Lasix in addition to the 5 mg of IV Lopressor. She was then also bolus with 1000 units of IV heparin.  Do to new-onset decompensated heart prior, unknown EF and CAD with ongoing chest discomfort with EKG abnormalities, the decision was made to consider this an acute coronary syndrome versus malignant hypertension, therefore she was taken  directly to the cardiac catheterization lab for urgent catheterization.   Past Medical History  Diagnosis Date  . Diabetes mellitus   . Hypertension   . Thoracic or lumbosacral neuritis or radiculitis, unspecified   . CAD S/P percutaneous coronary angioplasty     s/p BMS stent to RCA ~2000; PTCA of ISR in 2001  . Heart failure   . Insomnia    Past Surgical History  Procedure Laterality Date  . Lymph node resection    . Coronary artery bypass graft    . Partial hysterectomy    . Carpal tunnel release      BILATERAL    FAMHx: Family History  Problem Relation Age of Onset  . Coronary artery disease Mother   . Hypertension Mother   . Diabetes Father   . Stroke Mother   . Stroke      sibling  . Coronary artery disease      sibling  . Diabetes      sibling  . Hypertension      sibling    SOCHx:  reports that she has been smoking Cigarettes.  She has a 40 pack-year smoking history. She has never used smokeless tobacco. She reports that she drinks about 1.2 ounces of alcohol per week. She reports that she does not use illicit drugs.  ALLERGIES: No Known Allergies  HOME MEDICATIONS: Prescriptions prior to admission  Medication Sig Dispense Refill  . amitriptyline (ELAVIL) 50 MG tablet Takes 2 tablets nightly.      Marland Kitchen  aspirin EC 81 MG tablet Take 81 mg by mouth 2 (two) times daily.      . carvedilol (COREG) 25 MG tablet Take 25 mg by mouth 2 (two) times daily with a meal.        . clopidogrel (PLAVIX) 75 MG tablet Take 75 mg by mouth daily.        . cyanocobalamin 1000 MCG tablet Take 100 mcg by mouth daily.      . furosemide (LASIX) 20 MG tablet Take 20 mg by mouth daily.       Marland Kitchen glipiZIDE (GLUCOTROL XL) 5 MG 24 hr tablet Take 5 mg by mouth daily.      . isosorbide mononitrate (IMDUR) 30 MG 24 hr tablet Take 30 mg by mouth daily.       Marland Kitchen lisinopril-hydrochlorothiazide (PRINZIDE,ZESTORETIC) 20-25 MG per tablet Take 1 tablet by mouth daily.      . metFORMIN (GLUCOPHAGE)  1000 MG tablet Take 1,000 mg by mouth 2 (two) times daily with a meal.      . nitroGLYCERIN (NITROSTAT) 0.4 MG SL tablet Place 0.4 mg under the tongue every 5 (five) minutes as needed.        . pravastatin (PRAVACHOL) 40 MG tablet Take 40 mg by mouth at bedtime.      . pregabalin (LYRICA) 150 MG capsule Take 150 mg by mouth 2 (two) times daily.      . Varenicline Tartrate (CHANTIX CONTINUING MONTH PAK PO) Take by mouth. Daily as directed.       ROS: Review of Systems  Constitutional: Negative.   HENT: Negative.   Eyes: Negative.   Respiratory: Positive for cough, shortness of breath and wheezing. Negative for sputum production.   Cardiovascular: Positive for chest pain, orthopnea, leg swelling and PND.  Gastrointestinal: Negative.   Genitourinary: Negative.   Musculoskeletal: Negative.   Skin: Negative.   Neurological: Positive for dizziness. Negative for tingling, sensory change, speech change, focal weakness, seizures and loss of consciousness.  Endo/Heme/Allergies: Negative.   Psychiatric/Behavioral: Negative for substance abuse.  All other systems reviewed and are negative.  PHYSICAL EXAM:Blood pressure 176/95, pulse 99, temperature 98.9 F (37.2 C), temperature source Oral, resp. rate 24, SpO2 92.00%. General appearance: alert, cooperative, appears stated age, moderate distress, mildly obese and pleasant Neck: JVD - 3 cm above sternal notch, no adenopathy, no carotid bruit and supple, symmetrical, trachea midline Lungs: diminished breath sounds bibasilar, rales bibasilar and bilaterally and wheezes bilaterally Heart: tachy with normal S1&S2, no M/R/G; unable to palpate PMI Abdomen: mild tissue edema ? possible ascites.  No real HSM.  NABS Extremities: edema ~1+ Pulses: 2+ and symmetric Skin: Skin color, texture, turgor normal. No rashes or lesions Neurologic: Grossly normal; CN 2-12 grossly intact   Adult ECG Report - EMS  Rate: 116 ;  Rhythm: sinus tachycardia; LBBB with STE  in  V3-V5 concerning for Injury Pattern.  Borderline - but not clear cut criteria for STEMI.  IMPRESSION & PLAN Tasha Myers has presented today for surgery, with the diagnosis of Acute Coronary Syndrome with Hypertensive Emergency & Acute CHF Exacerbation.  The various methods of treatment have been discussed with the patient and family.   Risks / Complications include, but not limited to: Death, MI, CVA/TIA, VF/VT (with defibrillation), Bradycardia (need for temporary pacer placement), contrast induced nephropathy, bleeding / bruising / hematoma / pseudoaneurysm, vascular or coronary injury (with possible emergent CT or Vascular Surgery), adverse medication reactions, infection.    After consideration of risks,  benefits and other options for treatment, the patient has consented to Procedure(s):  LEFT HEART CATHETERIZATION AND CORONARY ANGIOGRAPHY +/- AD [place]  as a surgical intervention.   We will proceed with the planned procedure.  Further plans per procedure note and discuss and on-call c Ardiology Fellow's H&P  Hold metformin, blood pressure control with home medications plus nitrates Sliding scale insulin.  Checked for a BMP, toxicology screen, and a 2-D echo  [address] [place], [place]  [id number]  [phone]  09/28/2013 1:12 AM

## 2013-09-28 NOTE — ED Notes (Signed)
Patient presents to ED via [place]. Code STEMI called. Patient c/o of mid chest pain- rating pain 6/10. Pt also c/o of shortness of breath. A&Ox4 upon arrival to ED.

## 2013-09-28 NOTE — Progress Notes (Signed)
Echocardiogram 2D Echocardiogram has been performed.  Yuliya Nova 09/28/2013, 12:34 PM

## 2013-09-28 NOTE — ED Provider Notes (Signed)
CSN: [id number]     Arrival date & time 09/28/13  0004 History   First MD Initiated Contact with Patient 09/28/13 0006     Chief Complaint  Patient presents with  . Code STEMI     (Consider location/radiation/quality/duration/timing/severity/associated sxs/prior Treatment) HPI Comments: Pt with hx of CAD comes in with cc of chest pain. Pt has midsternal chest pain, pressure like. Also has dib. Symptoms started this evening. Pain is not described as tearing or severe, and is non radiating. Pt taking her meds as prescribed. No recent provocative testing or cardiac intervention.  EMS had called Code STEMI - and Cardiology at bedside upon patient arrival.  The history is provided by the patient and medical records.    Past Medical History  Diagnosis Date  . Diabetes mellitus   . Hypertension   . Thoracic or lumbosacral neuritis or radiculitis, unspecified   . CAD S/P percutaneous coronary angioplasty     s/p BMS stent to RCA ~2000; PTCA of ISR in 2001  . Heart failure   . Insomnia    Past Surgical History  Procedure Laterality Date  . Lymph node resection    . Coronary artery bypass graft    . Partial hysterectomy    . Carpal tunnel release      BILATERAL   Family History  Problem Relation Age of Onset  . Coronary artery disease Mother   . Hypertension Mother   . Diabetes Father   . Stroke Mother   . Stroke      sibling  . Coronary artery disease      sibling  . Diabetes      sibling  . Hypertension      sibling   History  Substance Use Topics  . Smoking status: Current Every Day Smoker -- 1.00 packs/day for 40 years    Types: Cigarettes  . Smokeless tobacco: Never Used  . Alcohol Use: 1.2 oz/week    2 Cans of beer per week   OB History   Grav Para Term Preterm Abortions TAB SAB Ect Mult Living                 Review of Systems  Constitutional: Positive for activity change. Negative for diaphoresis.  HENT: Negative for facial swelling.   Respiratory:  Positive for shortness of breath. Negative for cough and wheezing.   Cardiovascular: Positive for chest pain.  Gastrointestinal: Negative for nausea, vomiting, abdominal pain, diarrhea, constipation, blood in stool and abdominal distention.  Genitourinary: Negative for hematuria and difficulty urinating.  Musculoskeletal: Negative for neck pain.  Skin: Negative for color change.  Neurological: Negative for speech difficulty.  Hematological: Does not bruise/bleed easily.  Psychiatric/Behavioral: Negative for confusion.      Allergies  Review of patient's allergies indicates no known allergies.  Home Medications   Prior to Admission medications   Medication Sig Start Date End Date Taking? Authorizing Provider  amitriptyline (ELAVIL) 50 MG tablet Takes 2 tablets nightly.    Historical Provider, MD  aspirin EC 81 MG tablet Take 81 mg by mouth 2 (two) times daily.    Historical Provider, MD  carvedilol (COREG) 25 MG tablet Take 25 mg by mouth 2 (two) times daily with a meal.      Historical Provider, MD  clopidogrel (PLAVIX) 75 MG tablet Take 75 mg by mouth daily.      Historical Provider, MD  cyanocobalamin 1000 MCG tablet Take 100 mcg by mouth daily.    Historical Provider, MD  furosemide (LASIX) 20 MG tablet Take 20 mg by mouth daily.     Historical Provider, MD  glipiZIDE (GLUCOTROL XL) 5 MG 24 hr tablet Take 5 mg by mouth daily.    Historical Provider, MD  isosorbide mononitrate (IMDUR) 30 MG 24 hr tablet Take 30 mg by mouth daily.  10/24/10 10/24/11  Minna Merritts, MD  lisinopril-hydrochlorothiazide (PRINZIDE,ZESTORETIC) 20-25 MG per tablet Take 1 tablet by mouth daily.    Historical Provider, MD  metFORMIN (GLUCOPHAGE) 1000 MG tablet Take 1,000 mg by mouth 2 (two) times daily with a meal.    Historical Provider, MD  nitroGLYCERIN (NITROSTAT) 0.4 MG SL tablet Place 0.4 mg under the tongue every 5 (five) minutes as needed.      Historical Provider, MD  pravastatin (PRAVACHOL) 40 MG  tablet Take 40 mg by mouth at bedtime.    Historical Provider, MD  pregabalin (LYRICA) 150 MG capsule Take 150 mg by mouth 2 (two) times daily.    Historical Provider, MD  Varenicline Tartrate (CHANTIX CONTINUING MONTH PAK PO) Take by mouth. Daily as directed.    Historical Provider, MD   BP 176/95  Pulse 99  Temp(Src) 98.9 F (37.2 C) (Oral)  Resp 24  SpO2 92% Physical Exam  Nursing note and vitals reviewed. Constitutional: She is oriented to person, place, and time. She appears well-developed and well-nourished.  HENT:  Head: Normocephalic and atraumatic.  Eyes: EOM are normal. Pupils are equal, round, and reactive to light.  Neck: Neck supple. No JVD present.  Cardiovascular: Normal rate, regular rhythm, normal heart sounds and intact distal pulses.   Pulmonary/Chest: Effort normal. No respiratory distress.  Abdominal: Soft. She exhibits no distension. There is no tenderness. There is no rebound and no guarding.  Neurological: She is alert and oriented to person, place, and time.  Skin: Skin is warm and dry.    ED Course  Procedures (including critical care time) Labs Review Labs Reviewed  CBC WITH DIFFERENTIAL - Abnormal; Notable for the following:    WBC 12.8 (*)    Neutro Abs 9.7 (*)    All other components within normal limits  BASIC METABOLIC PANEL  TROPONIN I    Imaging Review No results found.   EKG Interpretation   Date/Time:  Monday September 28 2013 00:08:58 EDT Ventricular Rate:  112 PR Interval:  139 QRS Duration: 156 QT Interval:  414 QTC Calculation: 565 R Axis:   32 Text Interpretation:  Sinus tachycardia Probable left atrial enlargement  Left bundle branch block Discordant ST elevation in v1-v3 > 14mm Confirmed  by Panayiotis Rainville, MD, Thelma Comp [id number]) on 09/28/2013 12:19:55 AM      MDM   Final diagnoses:  Hypertensive emergency  Chest pain  LBBB (left bundle branch block)    Pt with chest pain. Typical chest pain. EKG in the ER shows LBBB (not new),  with some discordant ST elevation. Doesn't meet the criteria per Sgarbossa's. Given patient has typical chest pain, with several risk factors, Cardiolohy to take patient to the cath labs.  Pt given Hep bolus, Metop 5 mg ivp, Nitro gtt in the ER.  CRITICAL CARE Performed by: Varney Biles   Total critical care time: 35 minutes  Critical care time was exclusive of separately billable procedures and treating other patients.  Critical care was necessary to treat or prevent imminent or life-threatening deterioration.  Critical care was time spent personally by me on the following activities: development of treatment plan with patient and/or surrogate as well  as nursing, discussions with consultants, evaluation of patient's response to treatment, examination of patient, obtaining history from patient or surrogate, ordering and performing treatments and interventions, ordering and review of laboratory studies, ordering and review of radiographic studies, pulse oximetry and re-evaluation of patient's condition.   Varney Biles, MD 09/28/13 [phone]

## 2013-09-29 DIAGNOSIS — E785 Hyperlipidemia, unspecified: Secondary | ICD-10-CM

## 2013-09-29 LAB — GLUCOSE, CAPILLARY
GLUCOSE-CAPILLARY: 162 mg/dL — AB (ref 70–99)
GLUCOSE-CAPILLARY: 124 mg/dL — AB (ref 70–99)
Glucose-Capillary: 173 mg/dL — ABNORMAL HIGH (ref 70–99)
Glucose-Capillary: 195 mg/dL — ABNORMAL HIGH (ref 70–99)

## 2013-09-29 LAB — BASIC METABOLIC PANEL
BUN: 15 mg/dL (ref 6–23)
CO2: 27 mEq/L (ref 19–32)
CREATININE: 1.03 mg/dL (ref 0.50–1.10)
Calcium: 9.4 mg/dL (ref 8.4–10.5)
Chloride: 97 mEq/L (ref 96–112)
GFR calc Af Amer: 70 mL/min — ABNORMAL LOW (ref >90)
GFR calc non Af Amer: 60 mL/min — ABNORMAL LOW (ref >90)
GLUCOSE: 162 mg/dL — AB (ref 70–99)
POTASSIUM: 3.5 meq/L — AB (ref 3.7–5.3)
Sodium: 138 mEq/L (ref 137–147)

## 2013-09-29 LAB — PRO B NATRIURETIC PEPTIDE: Pro B Natriuretic peptide (BNP): 1245 pg/mL — ABNORMAL HIGH (ref 0–125)

## 2013-09-29 MED ORDER — POTASSIUM CHLORIDE CRYS ER 20 MEQ PO TBCR
20.0000 meq | EXTENDED_RELEASE_TABLET | Freq: Every day | ORAL | Status: DC
Start: 2013-09-30 — End: 2013-09-30
  Administered 2013-09-30: 20 meq via ORAL
  Filled 2013-09-29: qty 1

## 2013-09-29 MED ORDER — FUROSEMIDE 40 MG PO TABS
40.0000 mg | ORAL_TABLET | Freq: Two times a day (BID) | ORAL | Status: DC
  Administered 2013-09-29 – 2013-09-30 (×3): 40 mg via ORAL
  Filled 2013-09-29 (×5): qty 1

## 2013-09-29 MED ORDER — LIVING WELL WITH DIABETES BOOK
Freq: Once | Status: AC
  Administered 2013-09-29: 14:00:00
  Filled 2013-09-29: qty 1

## 2013-09-29 MED ORDER — POTASSIUM CHLORIDE CRYS ER 20 MEQ PO TBCR
40.0000 meq | EXTENDED_RELEASE_TABLET | Freq: Once | ORAL | Status: AC
  Administered 2013-09-29: 40 meq via ORAL
  Filled 2013-09-29: qty 2

## 2013-09-29 NOTE — Progress Notes (Addendum)
SUBJECTIVE:  Breathing much improved.  Complains of leg cramps  OBJECTIVE:   Vitals:   Filed Vitals:   09/28/13 1524 09/28/13 2118 09/29/13 0517 09/29/13 0819  BP: 111/60 122/48 136/57 140/71  Pulse: 64 64 66 67  Temp: 97.7 F (36.5 C) 98.4 F (36.9 C) 98.3 F (36.8 C)   TempSrc: Oral Oral Oral   Resp: 16 18 18    Height:      Weight:      SpO2: 97% 94% 99%    I&O's:   Intake/Output Summary (Last 24 hours) at 09/29/13 0830 Last data filed at 09/28/13 1200  Gross per 24 hour  Intake   28.5 ml  Output    800 ml  Net -771.5 ml   TELEMETRY: Reviewed telemetry pt in NSR:     PHYSICAL EXAM General: Well developed, well nourished, in no acute distress Head: Eyes PERRLA, No xanthomas.   Normal cephalic and atramatic  Lungs:   Clear bilaterally to auscultation and percussion. Heart:   HRRR S1 S2 Pulses are 2+ & equal. Abdomen: Bowel sounds are positive, abdomen soft and non-tender without masses Extremities:   No clubbing, cyanosis or edema.  DP +1 Neuro: Alert and oriented X 3. Psych:  Good affect, responds appropriately   LABS: Basic Metabolic Panel:  Recent Labs  09/28/13 0520 09/29/13 0410  NA 139 138  K 3.8 3.5*  CL 100 97  CO2 26 27  GLUCOSE 163* 162*  BUN 12 15  CREATININE 0.94 1.03  CALCIUM 9.1 9.4   Liver Function Tests:  Recent Labs  09/28/13 0050  AST 12  ALT 7  ALKPHOS 92  BILITOT 0.5  PROT 6.9  ALBUMIN 3.2*   No results found for this basename: LIPASE, AMYLASE,  in the last 72 hours CBC:  Recent Labs  09/28/13 0011 09/28/13 0059 09/28/13 0520  WBC 12.8*  --  11.3*  NEUTROABS 9.7*  --   --   HGB 12.5 12.9 11.6*  HCT 38.0 38.0 35.2*  MCV 93.1  --  92.9  PLT 310  --  291   Cardiac Enzymes:  Recent Labs  09/28/13 0011 09/28/13 0520  TROPONINI <0.30 <0.30   BNP: No components found with this basename: POCBNP,  D-Dimer: No results found for this basename: DDIMER,  in the last 72 hours Hemoglobin A1C:  Recent Labs  09/28/13 0520  HGBA1C 9.1*   Fasting Lipid Panel:  Recent Labs  09/28/13 0520  CHOL 210*  HDL 35*  LDLCALC 120*  TRIG 277*  CHOLHDL 6.0   Thyroid Function Tests: No results found for this basename: TSH, T4TOTAL, FREET3, T3FREE, THYROIDAB,  in the last 72 hours Anemia Panel: No results found for this basename: VITAMINB12, FOLATE, FERRITIN, TIBC, IRON, RETICCTPCT,  in the last 72 hours Coag Panel:   No results found for this basename: INR, PROTIME    RADIOLOGY: Dg Chest Port 1 View  09/28/2013   CLINICAL DATA:  Code STEMI.  EXAM: PORTABLE CHEST - 1 VIEW  COMPARISON:  Chest radiograph November 10, 2011.  FINDINGS: Cardiac silhouette appears moderately enlarged, even with consideration to this AP technique. Mediastinal silhouette is nonsuspicious. Diffuse interstitial prominence, without pleural effusions or focal consolidation. Perihilar peribronchial cuffing. No pneumothorax.  Biopsy clip projects in right breast. Multiple EKG lines overlie the patient and may obscure subtle underlying pathology. Mild degenerative change of the thoracic spine.  IMPRESSION: Worsening cardiomegaly with interstitial prominence favoring pulmonary edema without focal consolidation.   Electronically Signed  By: Elon Alas   On: 09/28/2013 00:55   Assessment/Plan:  1.   ACS (acute coronary syndrome) - cath showed minimal CAD with severe LV dysfunction and troponin is normal.    1. Malignant hypertension - resolved 2. LV Dysfunction - EF 30% by cath  3. Acute of Chronic Systolic CHF- felt secondary to malignant HTN -  Now on IV Lasix.  She is 3.4L net negative and weight not recorded this am.  Continue BB/ACE I/statin.  Change to PO Lasix.   4. Type II DM 5. Nonobstructive ASCAD with patent RCA stent by cath.  Continue ASA/Plavix/Imdur 6. Borderline hypokalemia - replete 7. Dyslipidemia - LDL 120 and not at goal.  Increase pravastatin to 80mg  daily at d/c  Discharge Wed am if doing  well     Sueanne Margarita, MD  09/29/2013  8:30 AM

## 2013-09-29 NOTE — Progress Notes (Signed)
Pt complaining of a dull ache in her left breast that extends to her back occasionally, pt states she has it all the time, when offered pain medicine pt stated no, when offered nitro pt stated no, will continue to monitor pt Rickard Rhymes, RN

## 2013-09-29 NOTE — Progress Notes (Signed)
Nutrition Consult/Brief Note  RD consulted for nutrition education regarding diabetes.   Lab Results  Component Value Date   HGBA1C 9.1* 09/28/2013    RD provided "Carbohydrate Counting for People with Diabetes" and "Diabetes Label Reading Tips" handouts from the Academy of Nutrition and Dietetics.  Patient opted to review information on her own time.  Body mass index is 29.46 kg/(m^2). Pt meets criteria for Overweight based on current BMI.  Current diet order is Carbohydrate Modified. Labs and medications reviewed. No further nutrition interventions warranted at this time.. If additional nutrition issues arise, please re-consult RD.  Arthur Holms, RD, LDN Pager #: [phone] After-Hours Pager #: [phone]

## 2013-09-29 NOTE — Progress Notes (Signed)
Inpatient Diabetes Program Recommendations  AACE/ADA: New Consensus Statement on Inpatient Glycemic Control (2013)  Target Ranges:  Prepandial:   less than 140 mg/dL      Peak postprandial:   less than 180 mg/dL (1-2 hours)      Critically ill patients:  140 - 180 mg/dL   Pt with HgbA1C of 9.1%. Have ordered dietician consult for basic education review and DM Ed'l booklet from pharmacy. Will try to talk with pt today to assess potential needs to control her DM  Thank you, Rosita Kea, RN, CNS, Diabetes Coordinator [phone])

## 2013-09-30 ENCOUNTER — Encounter (HOSPITAL_COMMUNITY): Payer: Self-pay | Admitting: Physician Assistant

## 2013-09-30 ENCOUNTER — Other Ambulatory Visit: Payer: Self-pay | Admitting: Physician Assistant

## 2013-09-30 ENCOUNTER — Telehealth: Payer: Self-pay

## 2013-09-30 DIAGNOSIS — I10 Essential (primary) hypertension: Secondary | ICD-10-CM | POA: Diagnosis present

## 2013-09-30 DIAGNOSIS — I2510 Atherosclerotic heart disease of native coronary artery without angina pectoris: Secondary | ICD-10-CM

## 2013-09-30 DIAGNOSIS — E785 Hyperlipidemia, unspecified: Secondary | ICD-10-CM | POA: Diagnosis present

## 2013-09-30 DIAGNOSIS — I5042 Chronic combined systolic (congestive) and diastolic (congestive) heart failure: Secondary | ICD-10-CM | POA: Insufficient documentation

## 2013-09-30 DIAGNOSIS — I447 Left bundle-branch block, unspecified: Secondary | ICD-10-CM

## 2013-09-30 DIAGNOSIS — I5043 Acute on chronic combined systolic (congestive) and diastolic (congestive) heart failure: Secondary | ICD-10-CM | POA: Diagnosis present

## 2013-09-30 DIAGNOSIS — Z9861 Coronary angioplasty status: Secondary | ICD-10-CM

## 2013-09-30 LAB — BASIC METABOLIC PANEL
BUN: 14 mg/dL (ref 6–23)
CALCIUM: 9.6 mg/dL (ref 8.4–10.5)
CHLORIDE: 100 meq/L (ref 96–112)
CO2: 27 mEq/L (ref 19–32)
Creatinine, Ser: 1.04 mg/dL (ref 0.50–1.10)
GFR calc Af Amer: 69 mL/min — ABNORMAL LOW (ref >90)
GFR calc non Af Amer: 59 mL/min — ABNORMAL LOW (ref >90)
Glucose, Bld: 172 mg/dL — ABNORMAL HIGH (ref 70–99)
POTASSIUM: 4.3 meq/L (ref 3.7–5.3)
Sodium: 140 mEq/L (ref 137–147)

## 2013-09-30 MED ORDER — PRAVASTATIN SODIUM 80 MG PO TABS: 80.0000 mg | ORAL_TABLET | Freq: Every day | ORAL | Status: DC

## 2013-09-30 MED ORDER — LISINOPRIL 20 MG PO TABS: 20.0000 mg | ORAL_TABLET | Freq: Every day | ORAL | Status: DC

## 2013-09-30 MED ORDER — METFORMIN HCL 1000 MG PO TABS: 1000.0000 mg | ORAL_TABLET | Freq: Two times a day (BID) | ORAL | Status: DC

## 2013-09-30 NOTE — Progress Notes (Addendum)
IV and tele removed, tech Remo Lipps came to desk and stated that pt was gone, Remo Lipps advised pt to wait on paperwork and prescriptions and pt stated "I have got to go" and walked down the hall, as patient was walking out paperwork was being printed for discharge and pt would not wait, pt did not receive any discharge education, medication, or prescriptions Rickard Rhymes, RN  Dr. Radford Pax notified of pt leaving without and discharge paperwork or prescriptions or follow-up appointments, instructions received to page the PA and notify them, [place] PA paged Rickard Rhymes, RN  Katie PA notified of pt leaving without follow-up or prescriptions Rickard Rhymes, RN

## 2013-09-30 NOTE — Telephone Encounter (Signed)
Attempted to contact pt regarding discharge from [facility] on 09/30/13. Pt left AMA without copy of discharge instructions or med list.  Mailed discharge AVS and appt reminder to pt that she has TCM appt w/ Dr. Rockey Situ 10/08/13 @ 7:45am.

## 2013-09-30 NOTE — Discharge Summary (Addendum)
Discharge Summary   Patient ID: Tasha Myers MRN: [id number], DOB/AGE: 1961/02/24 53 y.o. Admit date: 09/28/2013 D/C date:     09/30/2013  Primary Cardiologist: Dr. Rockey Situ   Principal Problem:   Malignant hypertension Active Problems:   Acute on chronic systolic and diastolic heart failure, NYHA class 3   Diabetes mellitus   HTN (hypertension), malignant   HLD (hyperlipidemia)   CAD S/P percutaneous coronary angioplasty    Discharge Diagnosis: Chest pain and dyspnea due to Malignant Hypertension With Acute on Chronic Combined Systolic and Diastolic Heart Failure   HPI: Tasha Myers is a 53 y.o. female with a history of CAD s/p inf MI s/p PCI and stenting to [place] in July 2000 at [place], HTN, Insulin-requiring DM, HLD, tobacco abuse, medication noncompliance, and depression who presented to [facility] on 09/28/13 with chest pain and dyspnea.  She began having chest discomfort and dyspnea significantly on the morning of admission but over last few weeks she had been having less urine output with her diuretic dosing. On the day of admission she also noted dyspnea the orthopnea as well as increasing edema, but by 9:00 at night she was also having significant 8-9/10 chest discomfort associated with it. She finally called EMS upon arrival an EKG showed known left bundle branch block but with previously unseen ST segment elevations in the leads V3 -V5 that had not been seen previously. Code STEMI was called due to the patient's persistent pain. Also notable on arrival of EMS was at her blood pressures were in the 481 systolic. After she received 100 of fentanyl and at least 2 doses sublingual nitroglycerin plus nitro paste, her pain did come down some and her blood pressures were still in the 190s.   Hospital Course: Upon arrival to [facility], the aforementioned ST segment elevations are no longer present her pain was down to a 5/10. Her blood pressures were still elevated but responded nicely to IV  Lopressor. She was found to have rales in both bases with significant pulmonary edema on chest x-ray. She she was given 40 mg of IV Lasix in addition to the 5 mg of IV Lopressor. She was then also bolus with 1000 units of IV heparin.  Do to new-onset decompensated heart failure, unknown EF and CAD with ongoing chest discomfort with EKG abnormalities, the decision was made to consider this an acute coronary syndrome versus malignant hypertension, therefore she was taken directly to the cardiac catheterization lab for urgent catheterization.  Chest pain and SOB- cath showed minimal CAD with severe LV dysfunction. Troponin remained normal LV Dysfunction - EF 30% by cath. Cath impressions below. Angiographically, only mild coronary disease with widely patent stent in the RCA.  Moderate to severely reduced LVEF of roughly 30-35%.  Presumed diagnoses would be Malignant Hypertension With Acute on Chronic Combined Systolic and Diastolic Heart Failure  Malignant hypertension - resolved.  Acute of Chronic Systolic/diastolic CHF- pulm edema on CXR and BNP 2.3K felt secondary to malignant HTN. -- 2D ECHO done with EF 45-50% and G2DD. Mild AR. Mild LA dilation.  -- She is 4.1L net negative. Discharge weight 177lbs. -- Continue BB/ACE I/Lasix.  Dyslipidemia - LDL 120 and not at goal. Her statin was increased from pravastatin 40 mg to 80 mg. Will need to recheck lipids in 6 weeks.  DM - HbgA1C at 9.4.  -- Restart Metformin on 6/18 am (48 hours post contrast exposure from cardiac cath). Restart Glipizide.  -- Needs close early followup  with PCP regarding elevated HbGA1C.  Hypokalemic- after IV Lasix. Now repleted.   The patient has had an uncomplicated hospital course and is recovering well. The radial catheter site is stable. She has been seen by Dr. Radford Pax today and deemed ready for discharge home. All follow-up appointments have been scheduled.  Discharge medications are listed below. She will resume her  metformin tomorrow. She will begin taking lisinopril 42m and her HCTZ will be discontinued (previously on Prinzide). All other meds will be resumed including DAPT with ASA/Plavix. The patient left angrily before she was able to get her paperwork with medication changes and follow up appointments. I personally tried calling all of the phone numbers listed on EPIC as well as her PCP and pharmacy for contact numbers. All of these numbers were disconnected. We have called Dr. GDonivan Sculloffice and [facility] mailed copy of her AVS if she still lives in the listed address.   Outstanding labs and studies- she will need fasting lipids in 6 weeks.    Discharge Vitals: Blood pressure 132/66, pulse 65, temperature 98.4 F (36.9 C), temperature source Oral, resp. rate 18, height '5\' 5"'  (1.651 m), weight 177 lb 0.5 oz (80.3 kg), SpO2 97.00%.  Labs: Lab Results  Component Value Date   WBC 11.3* 09/28/2013   HGB 11.6* 09/28/2013   HCT 35.2* 09/28/2013   MCV 92.9 09/28/2013   PLT 291 09/28/2013    Recent Labs Lab 09/28/13 0050  09/30/13 0638  NA  --   < > 140  K  --   < > 4.3  CL  --   < > 100  CO2  --   < > 27  BUN  --   < > 14  CREATININE  --   < > 1.04  CALCIUM  --   < > 9.6  PROT 6.9  --   --   BILITOT 0.5  --   --   ALKPHOS 92  --   --   ALT 7  --   --   AST 12  --   --   GLUCOSE  --   < > 172*  < > = values in this interval not displayed.  Recent Labs  09/28/13 0011 09/28/13 0520  TROPONINI <0.30 <0.30   Lab Results  Component Value Date   CHOL 210* 09/28/2013   HDL 35* 09/28/2013   LDLCALC 120* 09/28/2013   TRIG 277* 09/28/2013    Diagnostic Studies/Procedures   Dg Chest Port 1 View  09/28/2013   CLINICAL DATA:  Code STEMI.  EXAM: PORTABLE CHEST - 1 VIEW  COMPARISON:  Chest radiograph November 10, 2011.  FINDINGS: Cardiac silhouette appears moderately enlarged, even with consideration to this AP technique. Mediastinal silhouette is nonsuspicious. Diffuse  interstitial prominence, without pleural effusions or focal consolidation. Perihilar peribronchial cuffing. No pneumothorax.  Biopsy clip projects in right breast. Multiple EKG lines overlie the patient and may obscure subtle underlying pathology. Mild degenerative change of the thoracic spine.  IMPRESSION: Worsening cardiomegaly with interstitial prominence favoring pulmonary edema without focal consolidation.     CARDIAC CATHETERIZATION REPORT  NAME: Tasha HenkesMRN: [id number] DOB: 3Aug 03, 1962ADMIT DATE: 09/28/2013  Procedure Date: 09/28/2013  INTERVENTIONAL CARDIOLOGIST: DLeonie Man M.D., MS  PRIMARY CARE [place]: AGayland Curry MD  PRIMARY CARDIOLOGIST: TIda Rogue M.D.  PATIENT: Tasha Christmanis a 53y.o. female prior history of CAD who presented with signs and symptoms of  acute heart failure and malignant hypertension with possible acute coronary syndrome. She has a baseline left bundle branch block which makes EKG changes difficult to detect. She did have some transient ST elevations in the precordial leads not previously seen. Because she continued to have persistent chest pain and new onset heart failure with pulmonary edema on chest x-ray (Killip Class III) the decision was made to proceed with urgent cardiac catheterization to delineate her coronary anatomy and evaluate LVEDP post-diuresis and aggressive blood pressure control.  She was given 4000 units of heparin, 5 mg IV Lopressor and 40 mg IV Lasix in addition to initiation of IV nitroglycerin drip at 20 mcg per minute. The EMS she received 3 sublingual nitroglycerin, nitroglycerin glycerin paste and 324 mg of aspirin.  PRE-OPERATIVE DIAGNOSIS:  Acute Coronary Syndrome - possible non-STEMI versus STEMI  Acute Heart Failure At least Diastolic if not combined Diastolic plus Systolic (EF unknown at time of admission) PROCEDURES PERFORMED:  Left Heart Catheterization with Native Coronary Angiography via Right Radial  Artery  Left Ventriculography PROCEDURE: The patient was brought to the 2nd [facility] Lab in the fasting state and prepped and draped in the usual sterile fashion for Right Radial artery access. A modified Allen's test was performed on the Right wrist demonstrating excellent collateral flow for radial access. Sterile technique was used including antiseptics, cap, gloves, gown, hand hygiene, mask and sheet. Skin prep: Chlorhexidine.  Consent: Risks of procedure as well as the alternatives and risks of each were explained to the (patient/caregiver). Consent for procedure obtained.  Time Out: Verified patient identification, verified procedure, site/side was marked, verified correct patient position, special equipment/implants available, medications/allergies/relevent history reviewed, required imaging and test results available. Performed.  Access:  Right Radial Artery: 6 Fr Sheath - Seldinger Technique (Angiocath Micropuncture Kit)  Radial Cocktail - 10 mL;  Left Heart Catheterization: 5 Fr Catheters advanced or exchanged over a Long Exchange Safety J-wire into the ascending aorta.  Left & Right Coronary Artery Cineangiography: TIG 4.0 Catheter  LV Hemodynamics (LV Gram): TIG 4.0 catheter was advanced across the aortic valve over wire and exchanged for the Angled Pigtail Catheter Sheath removed in the Cath Lab with a TR band applied for hemostasis.  TR Band: 0100 Hours; 12 mL air  FINDINGS:  Hemodynamics: Blood pressure in the ER prior to arrival in the Cath Lab as 170/90 mm Hg; pressures measured in the Cath Lab where following radial cocktail administration and medications given in the ER  Central Aortic Pressure / Mean: 122/69/91 mmHg  Left Ventricular Pressure / LVEDP: 126/12/23 mmHg Left Ventriculography:  EF: 30-35 %  Wall Motion: Diffuse global hypokinesis with more notable basal to mid inferior- inferolateral wall Coronary Anatomy:  Dominance: Right Left  Main: Large-caliber vessel that trifurcates into the LAD, Ramus Intermedius, and Circumflex. Angiographically normal. LAD: Normal caliber vessel which gives rise to several diagonal branches is very proximal with 2 in the mid LAD. There is a roughly 40% lesion in the mid LAD it reaches down around the apex. Otherwise only mild to moderate luminal irregularities noted.  Left Circumflex: Normal vessel that small marginal branch before bifurcates into a lateral OM and the AV groove circumflex which gives off 2 posterolateral branches. Mild luminal irregularities throughout  Ramus intermedius: Small to moderate caliber vessel that is relatively short period; diffuse mild luminal irregularities  RCA: Normal caliber dominant vessel with a barely visible proximal stent and it has maybe 20% ISR. There is a high  bifurcation into the Right Posterior AV Groove Branch (RPAV) and the Right Posterior Descending Artery that gives off a major branch distally. Minimal luminal irregularity throughout the entire RCA system  RPL Sysytem:The RPAV is a long branch that gives off 3 major and several smaller posterior lateral branches. MEDICATIONS:  Anesthesia: Local Lidocaine 2 ml Sedation: 2 mg IV Versed, 50 mcg IV fentanyl ;  Premedication:  EMS: 100 mcg IV fentanyl; 3 sublingual nitroglycerin; nitro paste; 324 mg aspirin  ER: 4000 units IV heparin, 5 mg IV Lopressor, 40 mg IV Lasix Omnipaque Contrast: 90 ml  Anticoagulation: No additional heparin given Radial Cocktail: 5 mg Verapamil, 400 mcg NTG, 2 ml 2% Lidocaine in 10 ml NS PATIENT DISPOSITION:  The patient was transferred to the PACU holding area in a hemodynamicaly stable, chest pain free condition.  The patient tolerated the procedure well, and there were no complications. EBL: < 5 ml  The patient was stable before, during, and after the procedure. POST-OPERATIVE DIAGNOSIS:  Angiographically, only mild coronary disease with widely patent stent in the RCA.    Moderate to severely reduced LVEF of roughly 30-35%.  Presumed diagnoses would be Malignant Hypertension With Acute on Chronic Combined Systolic and Diastolic Heart Failure PLAN OF CARE:  Admit to TCU overnight with IV nitroglycerin infusion. Will dose of IV Lasix twice a day starting tomorrow.  Check 2-D echocardiogram  Continue home dose of beta blocker and ACE inhibitor, but discontinue HCTZ and noted to use furosemide plus or minus spironolactone.   2D ECHO: 09/28/2013 LV EF: 45% - 50% ------------------------------------------------------------------- Study Conclusions - Left ventricle: The cavity size was normal. Wall thickness was increased in a pattern of mild LVH. Systolic function was mildly reduced. The estimated ejection fraction was in the range of 45% to 50%. Wall motion was normal; there were no regional wall motion abnormalities. Features are consistent with a pseudonormal left ventricular filling pattern, with concomitant abnormal relaxation and increased filling pressure (grade 2 diastolic dysfunction). Doppler parameters are consistent with high ventricular filling pressure. - Aortic valve: There was mild regurgitation. - Left atrium: The atrium was mildly dilated.    Discharge Medications     Medication List    STOP taking these medications       lisinopril-hydrochlorothiazide 20-25 MG per tablet  Commonly known as:  PRINZIDE,ZESTORETIC      TAKE these medications       aspirin EC 81 MG tablet  Take 81 mg by mouth daily.     carvedilol 25 MG tablet  Commonly known as:  COREG  Take 25 mg by mouth 2 (two) times daily with a meal.     clopidogrel 75 MG tablet  Commonly known as:  PLAVIX  Take 75 mg by mouth daily.     cyanocobalamin 1000 MCG tablet  Take 100 mcg by mouth daily.     furosemide 20 MG tablet  Commonly known as:  LASIX  Take 20 mg by mouth daily.     glipiZIDE 5 MG 24 hr tablet  Commonly known as:  GLUCOTROL XL  Take 5 mg by  mouth daily.     isosorbide mononitrate 30 MG 24 hr tablet  Commonly known as:  IMDUR  Take 30 mg by mouth daily.     lisinopril 20 MG tablet  Commonly known as:  PRINIVIL,ZESTRIL  Take 1 tablet (20 mg total) by mouth daily.     metFORMIN 1000 MG tablet  Commonly known as:  GLUCOPHAGE  Take 1  tablet (1,000 mg total) by mouth 2 (two) times daily with a meal.  Start taking on:  10/01/2013     nitroGLYCERIN 0.4 MG SL tablet  Commonly known as:  NITROSTAT  Place 0.4 mg under the tongue every 5 (five) minutes as needed for chest pain.     pravastatin 80 MG tablet  Commonly known as:  PRAVACHOL  Take 1 tablet (80 mg total) by mouth at bedtime.     pregabalin 150 MG capsule  Commonly known as:  LYRICA  Take 150 mg by mouth 2 (two) times daily.        Disposition   The patient will be discharged in stable condition to home.  Follow-up Information   Follow up with Ida Rogue, MD On 10/08/2013. (7:45am )    Specialty:  Cardiology   Contact information:   [place] [address] [phone]       Follow up with [facility], MD.   Specialty:  Family Medicine   Contact information:   [place] [address] [phone]         Duration of Discharge Encounter: Greater than 30 minutes including physician and PA time.  SignedVertell Limber, KATHRYN PA-C 09/30/2013, 2:18 PM

## 2013-09-30 NOTE — Discharge Summary (Signed)
Agree with change in note stating patient's pravastatin was increased to 80mg  daily due to LDL not being at goal.  Will recheck FLT and ALT in 6 weeks

## 2013-09-30 NOTE — Discharge Summary (Addendum)
Agree with discharge summary as outlined by Perry Mount PA-C.  Will actually increase Pravastatin to 80mg  daily at discharge with followup lipids in 6 weeks with ALT since her LDL this admission was not at goal (120).

## 2013-09-30 NOTE — Discharge Instructions (Signed)

## 2013-09-30 NOTE — Progress Notes (Addendum)
SUBJECTIVE:  No complaints  OBJECTIVE:   Vitals:   Filed Vitals:   09/29/13 0819 09/29/13 1345 09/29/13 2136 09/30/13 0457  BP: 140/71 117/58 129/51 132/66  Pulse: 67 64 64 65  Temp:  98 F (36.7 C) 97.9 F (36.6 C) 98.4 F (36.9 C)  TempSrc:  Oral Oral Oral  Resp:  20 18 18   Height:      Weight:      SpO2:  99% 99% 97%   I&O's:   Intake/Output Summary (Last 24 hours) at 09/30/13 0851 Last data filed at 09/29/13 2137  Gross per 24 hour  Intake    120 ml  Output   1050 ml  Net   -930 ml   TELEMETRY: Reviewed telemetry pt in NSR:     PHYSICAL EXAM General: Well developed, well nourished, in no acute distress Head: Eyes PERRLA, No xanthomas.   Normal cephalic and atramatic  Lungs:   Clear bilaterally to auscultation and percussion. Heart:   HRRR S1 S2 Pulses are 2+ & equal. Abdomen: Bowel sounds are positive, abdomen soft and non-tender without masses Extremities:   No clubbing, cyanosis or edema.  DP +1 Neuro: Alert and oriented X 3. Psych:  Good affect, responds appropriately   LABS: Basic Metabolic Panel:  Recent Labs  09/29/13 0410 09/30/13 0638  NA 138 140  K 3.5* 4.3  CL 97 100  CO2 27 27  GLUCOSE 162* 172*  BUN 15 14  CREATININE 1.03 1.04  CALCIUM 9.4 9.6   Liver Function Tests:  Recent Labs  09/28/13 0050  AST 12  ALT 7  ALKPHOS 92  BILITOT 0.5  PROT 6.9  ALBUMIN 3.2*   No results found for this basename: LIPASE, AMYLASE,  in the last 72 hours CBC:  Recent Labs  09/28/13 0011 09/28/13 0059 09/28/13 0520  WBC 12.8*  --  11.3*  NEUTROABS 9.7*  --   --   HGB 12.5 12.9 11.6*  HCT 38.0 38.0 35.2*  MCV 93.1  --  92.9  PLT 310  --  291   Cardiac Enzymes:  Recent Labs  09/28/13 0011 09/28/13 0520  TROPONINI <0.30 <0.30   BNP: No components found with this basename: POCBNP,  D-Dimer: No results found for this basename: DDIMER,  in the last 72 hours Hemoglobin A1C:  Recent Labs  09/28/13 0520  HGBA1C 9.1*   Fasting  Lipid Panel:  Recent Labs  09/28/13 0520  CHOL 210*  HDL 35*  LDLCALC 120*  TRIG 277*  CHOLHDL 6.0   Thyroid Function Tests: No results found for this basename: TSH, T4TOTAL, FREET3, T3FREE, THYROIDAB,  in the last 72 hours Anemia Panel: No results found for this basename: VITAMINB12, FOLATE, FERRITIN, TIBC, IRON, RETICCTPCT,  in the last 72 hours Coag Panel:   No results found for this basename: INR, PROTIME    RADIOLOGY: Dg Chest Port 1 View  09/28/2013   CLINICAL DATA:  Code STEMI.  EXAM: PORTABLE CHEST - 1 VIEW  COMPARISON:  Chest radiograph November 10, 2011.  FINDINGS: Cardiac silhouette appears moderately enlarged, even with consideration to this AP technique. Mediastinal silhouette is nonsuspicious. Diffuse interstitial prominence, without pleural effusions or focal consolidation. Perihilar peribronchial cuffing. No pneumothorax.  Biopsy clip projects in right breast. Multiple EKG lines overlie the patient and may obscure subtle underlying pathology. Mild degenerative change of the thoracic spine.  IMPRESSION: Worsening cardiomegaly with interstitial prominence favoring pulmonary edema without focal consolidation.   Electronically Signed   By: Sandie Ano  Bloomer   On: 09/28/2013 00:55   Assessment/Plan:  1. ACS (acute coronary syndrome) - cath showed minimal CAD with severe LV dysfunction and troponin is normal.  1. Malignant hypertension - resolved 2. LV Dysfunction - EF 30% by cath  3. Acute of Chronic Systolic CHF- felt secondary to malignant HTN -  She is 4.1L net negative. Continue BB/ACE I/Lasix.  4. Type II DM 5. Nonobstructive ASCAD with patent RCA stent by cath. Continue ASA/Plavix/Imdur 6. Borderline hypokalemia - repleted 7. Dyslipidemia - LDL 120 and not at goal. Increase Pravastatin to 80mg  daily at d/c 8. DM with elevated HbgA1C at 9.4.  Restart Metformin on 6/18 am.  Restart Glipizide.  Needs close early followup with PCP regarding elevated HbGA1C.  Discharge  Home. Followup with Dr. Rockey Situ in 1 week with [facility].  She will need an FLP and ALT in 6 weeks after increasing Pravastatin.   Sueanne Margarita, MD  09/30/2013  8:51 AM

## 2013-10-08 ENCOUNTER — Encounter: Payer: Medicare Other | Admitting: Cardiovascular Disease

## 2013-10-08 ENCOUNTER — Encounter: Payer: Self-pay | Admitting: *Deleted

## 2014-01-11 ENCOUNTER — Emergency Department (HOSPITAL_COMMUNITY)
Admission: EM | Admit: 2014-01-11 | Discharge: 2014-01-11 | Discharge status: Against Medical Advice | Payer: Medicare HMO | Attending: Emergency Medicine | Admitting: Emergency Medicine

## 2014-01-11 ENCOUNTER — Emergency Department (HOSPITAL_COMMUNITY): Payer: Medicare HMO

## 2014-01-11 ENCOUNTER — Encounter (HOSPITAL_COMMUNITY): Payer: Self-pay | Admitting: Emergency Medicine

## 2014-01-11 ENCOUNTER — Telehealth: Payer: Self-pay | Admitting: Physician Assistant

## 2014-01-11 DIAGNOSIS — R0989 Other specified symptoms and signs involving the circulatory and respiratory systems: Secondary | ICD-10-CM | POA: Insufficient documentation

## 2014-01-11 DIAGNOSIS — E119 Type 2 diabetes mellitus without complications: Secondary | ICD-10-CM | POA: Diagnosis not present

## 2014-01-11 DIAGNOSIS — I10 Essential (primary) hypertension: Secondary | ICD-10-CM | POA: Diagnosis not present

## 2014-01-11 DIAGNOSIS — Z79899 Other long term (current) drug therapy: Secondary | ICD-10-CM | POA: Diagnosis not present

## 2014-01-11 DIAGNOSIS — G4700 Insomnia, unspecified: Secondary | ICD-10-CM | POA: Diagnosis not present

## 2014-01-11 DIAGNOSIS — I5043 Acute on chronic combined systolic (congestive) and diastolic (congestive) heart failure: Secondary | ICD-10-CM | POA: Insufficient documentation

## 2014-01-11 DIAGNOSIS — Z951 Presence of aortocoronary bypass graft: Secondary | ICD-10-CM | POA: Insufficient documentation

## 2014-01-11 DIAGNOSIS — R000 Tachycardia, unspecified: Secondary | ICD-10-CM | POA: Insufficient documentation

## 2014-01-11 DIAGNOSIS — F17200 Nicotine dependence, unspecified, uncomplicated: Secondary | ICD-10-CM | POA: Diagnosis not present

## 2014-01-11 DIAGNOSIS — R61 Generalized hyperhidrosis: Secondary | ICD-10-CM | POA: Diagnosis not present

## 2014-01-11 DIAGNOSIS — R0609 Other forms of dyspnea: Secondary | ICD-10-CM | POA: Diagnosis present

## 2014-01-11 DIAGNOSIS — Z9861 Coronary angioplasty status: Secondary | ICD-10-CM

## 2014-01-11 DIAGNOSIS — Z7982 Long term (current) use of aspirin: Secondary | ICD-10-CM | POA: Insufficient documentation

## 2014-01-11 DIAGNOSIS — Z7902 Long term (current) use of antithrombotics/antiplatelets: Secondary | ICD-10-CM | POA: Diagnosis not present

## 2014-01-11 DIAGNOSIS — I2510 Atherosclerotic heart disease of native coronary artery without angina pectoris: Secondary | ICD-10-CM | POA: Diagnosis not present

## 2014-01-11 DIAGNOSIS — Z8739 Personal history of other diseases of the musculoskeletal system and connective tissue: Secondary | ICD-10-CM | POA: Diagnosis not present

## 2014-01-11 DIAGNOSIS — R0602 Shortness of breath: Secondary | ICD-10-CM | POA: Diagnosis not present

## 2014-01-11 DIAGNOSIS — E785 Hyperlipidemia, unspecified: Secondary | ICD-10-CM | POA: Insufficient documentation

## 2014-01-11 LAB — I-STAT TROPONIN, ED: Troponin i, poc: 0.01 ng/mL (ref 0.00–0.08)

## 2014-01-11 LAB — I-STAT ARTERIAL BLOOD GAS, ED
ACID-BASE EXCESS: 2 mmol/L (ref 0.0–2.0)
Bicarbonate: 24.2 mEq/L — ABNORMAL HIGH (ref 20.0–24.0)
O2 Saturation: 94 %
PCO2 ART: 29.5 mmHg — AB (ref 35.0–45.0)
PO2 ART: 61 mmHg — AB (ref 80.0–100.0)
Patient temperature: 98
TCO2: 25 mmol/L (ref 0–100)
pH, Arterial: 7.521 — ABNORMAL HIGH (ref 7.350–7.450)

## 2014-01-11 LAB — BASIC METABOLIC PANEL
Anion gap: 14 (ref 5–15)
BUN: 12 mg/dL (ref 6–23)
CO2: 24 meq/L (ref 19–32)
CREATININE: 0.75 mg/dL (ref 0.50–1.10)
Calcium: 9.6 mg/dL (ref 8.4–10.5)
Chloride: 101 mEq/L (ref 96–112)
GFR calc Af Amer: >90 mL/min (ref >90)
GFR calc non Af Amer: >90 mL/min (ref >90)
GLUCOSE: 168 mg/dL — AB (ref 70–99)
Potassium: 3.9 mEq/L (ref 3.7–5.3)
Sodium: 139 mEq/L (ref 137–147)

## 2014-01-11 LAB — CBC WITH DIFFERENTIAL/PLATELET
BASOS PCT: 1 % (ref 0–1)
Basophils Absolute: 0.1 10*3/uL (ref 0.0–0.1)
EOS ABS: 0.1 10*3/uL (ref 0.0–0.7)
Eosinophils Relative: 1 % (ref 0–5)
HCT: 34.9 % — ABNORMAL LOW (ref 36.0–46.0)
Hemoglobin: 11.7 g/dL — ABNORMAL LOW (ref 12.0–15.0)
LYMPHS ABS: 2.2 10*3/uL (ref 0.7–4.0)
Lymphocytes Relative: 24 % (ref 12–46)
MCH: 31.2 pg (ref 26.0–34.0)
MCHC: 33.5 g/dL (ref 30.0–36.0)
MCV: 93.1 fL (ref 78.0–100.0)
Monocytes Absolute: 0.5 10*3/uL (ref 0.1–1.0)
Monocytes Relative: 5 % (ref 3–12)
NEUTROS ABS: 6.3 10*3/uL (ref 1.7–7.7)
NEUTROS PCT: 69 % (ref 43–77)
PLATELETS: 303 10*3/uL (ref 150–400)
RBC: 3.75 MIL/uL — ABNORMAL LOW (ref 3.87–5.11)
RDW: 15.2 % (ref 11.5–15.5)
WBC: 9.1 10*3/uL (ref 4.0–10.5)

## 2014-01-11 LAB — PRO B NATRIURETIC PEPTIDE: PRO B NATRI PEPTIDE: 2952 pg/mL — AB (ref 0–125)

## 2014-01-11 LAB — I-STAT CG4 LACTIC ACID, ED: LACTIC ACID, VENOUS: 1.93 mmol/L (ref 0.5–2.2)

## 2014-01-11 MED ORDER — FENTANYL CITRATE 0.05 MG/ML IJ SOLN
50.0000 ug | Freq: Once | INTRAMUSCULAR | Status: AC
  Administered 2014-01-11: 50 ug via INTRAVENOUS
  Filled 2014-01-11: qty 2

## 2014-01-11 MED ORDER — NITROGLYCERIN IN D5W 200-5 MCG/ML-% IV SOLN: 0.0000 ug/min | Freq: Once | INTRAVENOUS | Status: DC

## 2014-01-11 MED ORDER — FUROSEMIDE 10 MG/ML IJ SOLN
40.0000 mg | Freq: Once | INTRAMUSCULAR | Status: AC
  Administered 2014-01-11: 40 mg via INTRAVENOUS
  Filled 2014-01-11: qty 4

## 2014-01-11 MED ORDER — ASPIRIN 325 MG PO TABS
325.0000 mg | ORAL_TABLET | Freq: Once | ORAL | Status: AC
  Administered 2014-01-11: 325 mg via ORAL
  Filled 2014-01-11: qty 1

## 2014-01-11 NOTE — ED Notes (Signed)
Pt was found by RN upstairs attempting to get to ED, pt reports having upper back pain and shortness of breath over the past 2 days- denies CP at present.  Pt diaphoretic at present.  Brought directly back to exam room from triage.

## 2014-01-11 NOTE — Telephone Encounter (Signed)
Left Message with [facility] to call patient for very close followup. Recent cath and PCI present with HF symptom, received 40mg  IV lasix in ED. However did not wish to be admitted. Likely need more lasix as outpatient. Need very close follow up.  Hilbert Corrigan PA Pager: [phone]

## 2014-01-11 NOTE — ED Notes (Signed)
PT monitored by pulse ox, bp cuff, and 12-lead. 

## 2014-01-11 NOTE — ED Notes (Signed)
Cardiology at bedside.

## 2014-01-11 NOTE — ED Notes (Signed)
MD at bedside. 

## 2014-01-11 NOTE — H&P (Signed)
entered in error   

## 2014-01-11 NOTE — ED Notes (Signed)
Pt reports she wants to leave. MD informed. Cardio to see pt.

## 2014-01-11 NOTE — ED Notes (Signed)
Pt refuses to continued to be hooked up to VS machine. Will wait to see cardiology.

## 2014-01-11 NOTE — ED Notes (Signed)
Unable to obtain pt's signature as pt left facility without informing staff.

## 2014-01-11 NOTE — ED Notes (Signed)
Per pt's granddaughter, pt ripped her IV out and said she was leaving. Granddaughter states pt left to go home. EDP informed.

## 2014-01-11 NOTE — Consult Note (Signed)
Patient ID: Tasha Myers MRN: [id number], DOB/AGE: 1960-05-23   Admit date: 01/11/2014   Primary Physician: Gayland Curry, MD Primary Cardiologist: Ida Rogue, M.D  Pt. Profile:  53 year old female with past medical history significant for hypertension, diabetes, hyperlipidemia, tobacco abuse, medication noncompliance, depression, and history of coronary artery disease status post inferior MI with PCI to the proximal RCA in July 2000 at [facility] presented with CHF for 2-3 days  Problem List  Past Medical History  Diagnosis Date  . Diabetes mellitus   . Hypertension   . Thoracic or lumbosacral neuritis or radiculitis, unspecified   . CAD S/P percutaneous coronary angioplasty     s/p BMS stent to RCA ~2000; PTCA of ISR in 2001  . Insomnia   . Chronic combined systolic and diastolic CHF (congestive heart failure)   . HLD (hyperlipidemia)   . HTN (hypertension), malignant     Past Surgical History  Procedure Laterality Date  . Lymph node resection    . Coronary artery bypass graft    . Partial hysterectomy    . Carpal tunnel release      BILATERAL     Allergies  No Known Allergies  HPI  The patient is a 53 year old female with past medical history significant for hypertension, diabetes, hyperlipidemia, tobacco abuse, medication noncompliance, depression, and history of coronary artery disease status post inferior MI with PCI to the proximal RCA in July 2000 at [facility]. Patient was recently admitted to [facility] in June 2015 with significant chest discomfort. The chest discomfort was prolonged, on arrival her EKG showed chronic left bundle branch block with previously unseeing ST elevation in V3 through V5, code STEMI was called at the time. Notably, her blood pressure was over [id number] systolic during that admission. Patient underwent a cardiac catheterization on 09/28/2013 which revealed EF 30-35%, diffuse hypokinesis with basal to midinferior inferior wall hypokinesis, only  mild coronary artery disease with widely patent stents in RCA. Her chest pain was presumably due to malignant hypertension with acute on chronic combined systolic and diastolic heart failure. This was eventually discharged after diuresis on 09/30/2013  According to the patient, she has been compliant with her medication at home and take diuretic every day. For the past 2-3 days, patient has been having worsening shortness of breath. She also noted orthopnea and paroxysmal nocturnal dyspnea in the recent days as well. She denies any significant fever, chill, or cough. According to the patient, she continued to urinate very frequently with diuretic at home. Notably, she had worsening shortness of breath last night with significant paroxysmal nocturnal dyspnea. She states she did not sleep last night. Patient finally decided to seek medical attention at [facility] on 01/03/2014.  Blood pressure on arrival was noted to be elevated at 170/95. She was mild tachycardic with heart rate of 108. O2 saturation 94% on room air. Patient was given one dose of IV Lasix of 40 mg. Apparently patient also complained of new onset back pain for the past 2-3 days. She was given fentanyl 50 mcg IV in the emergency room. Cardiology was consulted for acute on chronic heart failure. However by the time cardiology has seen the patient, she is fully dressed and wished to be discharged from the ED. She denies any current shortness breath or back pain.  Of note, interestingly, although the patient is Report states her EF is about 30-35% in June. She had echocardiogram done on the same day on 6/15 which showed EF 45-50%, with grade  2 diastolic dysfunction and mild aortic regurg.  Home Medications  Prior to Admission medications   Medication Sig Start Date End Date Taking? Authorizing Provider  aspirin EC 81 MG tablet Take 81 mg by mouth daily.    Yes Historical Provider, MD  carvedilol (COREG) 25 MG tablet Take 25 mg by mouth  2 (two) times daily with a meal.     Yes Historical Provider, MD  clopidogrel (PLAVIX) 75 MG tablet Take 75 mg by mouth daily.     Yes Historical Provider, MD  furosemide (LASIX) 20 MG tablet Take 20 mg by mouth daily.    Yes Historical Provider, MD  glipiZIDE (GLUCOTROL XL) 5 MG 24 hr tablet Take 5 mg by mouth daily.   Yes Historical Provider, MD  isosorbide mononitrate (IMDUR) 30 MG 24 hr tablet Take 30 mg by mouth daily.   Yes Historical Provider, MD  lisinopril (PRINIVIL,ZESTRIL) 20 MG tablet Take 1 tablet (20 mg total) by mouth daily. 09/30/13  Yes Eileen Stanford, PA-C  metFORMIN (GLUCOPHAGE) 1000 MG tablet Take 1 tablet (1,000 mg total) by mouth 2 (two) times daily with a meal. 10/01/13  Yes Eileen Stanford, PA-C  oxyCODONE-acetaminophen (PERCOCET/ROXICET) 5-325 MG per tablet Take 1 tablet by mouth every 4 (four) hours as needed for severe pain.   Yes Historical Provider, MD  pravastatin (PRAVACHOL) 80 MG tablet Take 1 tablet (80 mg total) by mouth at bedtime. 09/30/13  Yes Eileen Stanford, PA-C  pregabalin (LYRICA) 150 MG capsule Take 150 mg by mouth 2 (two) times daily.   Yes Historical Provider, MD  nitroGLYCERIN (NITROSTAT) 0.4 MG SL tablet Place 0.4 mg under the tongue every 5 (five) minutes as needed for chest pain.     Historical Provider, MD    Family History  Family History  Problem Relation Age of Onset  . Coronary artery disease Mother   . Hypertension Mother   . Diabetes Father   . Stroke Mother   . Stroke      sibling  . Coronary artery disease      sibling  . Diabetes      sibling  . Hypertension      sibling    Social History  History   Social History  . Marital Status: Married    Spouse Name: N/A    Number of Children: N/A  . Years of Education: N/A   Occupational History  . Not on file.   Social History Main Topics  . Smoking status: Current Every Day Smoker -- 1.00 packs/day for 40 years    Types: Cigarettes  . Smokeless tobacco: Never  Used  . Alcohol Use: 1.2 oz/week    2 Cans of beer per week  . Drug Use: No  . Sexual Activity: Not on file   Other Topics Concern  . Not on file   Social History Narrative  . No narrative on file     Review of Systems General:  No chills, fever, night sweats or weight changes.  Cardiovascular:  No chest pain, dyspnea on exertion, edema. +orthopnea, palpitations, paroxysmal nocturnal dyspnea. Dermatological: No rash, lesions/masses Respiratory: No cough. +dyspnea  Urologic: No hematuria, dysuria Abdominal:   No nausea, vomiting, diarrhea, bright red blood per rectum, melena, or hematemesis Neurologic:  No visual changes, wkns, changes in mental status. All other systems reviewed and are otherwise negative except as noted above.  Physical Exam  Blood pressure 132/65, pulse 81, temperature 98 F (36.7 C), temperature source Oral,  resp. rate 23, SpO2 95.00%.  General: Pleasant, NAD Psych: Normal affect. Neuro: Alert and oriented X 3. Moves all extremities spontaneously. HEENT: Normal  Neck: Supple without bruits or JVD. Lungs:  Resp regular and unlabored, CTA. Heart: RRR no s3, s4, or murmurs. Abdomen: Soft, non-tender, non-distended, BS + x 4.  Extremities: No clubbing, cyanosis or edema. DP/PT/Radials 2+ and equal bilaterally.  Labs  Troponin [facility] of Care Test)  Recent Labs  01/11/14 1732  TROPIPOC 0.01   No results found for this basename: CKTOTAL, CKMB, TROPONINI,  in the last 72 hours Lab Results  Component Value Date   WBC 9.1 01/11/2014   HGB 11.7* 01/11/2014   HCT 34.9* 01/11/2014   MCV 93.1 01/11/2014   PLT 303 01/11/2014    Recent Labs Lab 01/11/14 1700  NA 139  K 3.9  CL 101  CO2 24  BUN 12  CREATININE 0.75  CALCIUM 9.6  GLUCOSE 168*   Lab Results  Component Value Date   CHOL 210* 09/28/2013   HDL 35* 09/28/2013   LDLCALC 120* 09/28/2013   TRIG 277* 09/28/2013   No results found for this basename: DDIMER     Radiology/Studies  Dg Chest  Portable 1 View  01/11/2014   CLINICAL DATA:  Upper back pain.  Short of breath.  EXAM: PORTABLE CHEST - 1 VIEW  COMPARISON:  09/28/2013.  FINDINGS: Cardiopericardial silhouette upper limits of normal for projection. Diffuse bilateral airspace disease is present, most compatible with moderate alveolar pulmonary edema. The airspace disease is worse than on the prior exam 09/28/2013. Monitoring leads project over the chest. Suspect tiny bilateral pleural effusions.  IMPRESSION: Moderate CHF.   Electronically Signed   By: Dereck Ligas M.D.   On: 01/11/2014 17:35    ECG  Sinus tach with heart rate 110, left bundle branch block.  Echocardiogram 09/28/2013  LV EF: 45% - 50%  ------------------------------------------------------------------- Indications: Chest pain 786.51.  ------------------------------------------------------------------- History: PMH: Coronary artery disease. Risk factors: Hypertension. Diabetes mellitus.  ------------------------------------------------------------------- Study Conclusions  - Left ventricle: The cavity size was normal. Wall thickness was increased in a pattern of mild LVH. Systolic function was mildly reduced. The estimated ejection fraction was in the range of 45% to 50%. Wall motion was normal; there were no regional wall motion abnormalities. Features are consistent with a pseudonormal left ventricular filling pattern, with concomitant abnormal relaxation and increased filling pressure (grade 2 diastolic dysfunction). Doppler parameters are consistent with high ventricular filling pressure. - Aortic valve: There was mild regurgitation. - Left atrium: The atrium was mildly dilated.      ASSESSMENT AND PLAN  1. acute on chronic systolic and diastolic heart failure  - ProBNP 3000, chest x-ray moderate CHF  - received 1 dose of IV lasix in ED. Attempt to leave from ED, however discussed with patient that her symptom are consistent with  HF  2. history of coronary artery disease status status post inferior MI with PCI to the proximal RCA in July 2000 at [facility].  - Last cath June 2015, mild coronary disease widely patent stent in RCA  - Denies any chest pain, trend troponin overnight, no further ischemic workup if troponin negative  3. Hypertension: Blood pressure needs better control, partially contributed to her current symptom of fluid overload.  4. Diabetes 5. Hyperlipidemia 6. tobacco abuse 7. medication noncompliance 8.  depression  Signed, Almyra Deforest, PA-C 01/11/2014, 8:15 PM   The patient was seen, examined and discussed with Almyra Deforest, PA-C and I  agree with the above.   53 year old female with h/o inferior STEMI in 2000 who presented with CHF symptoms today. She has acute on chronic combined systolic and diastolic CHF, possibly due to HTN urgency. BP on arrival 170/95 . BNP 3000, CXR shows interstitial edema. The patient received iv Lasix 40 mg in the ED and diuresed apporximaly 1 liter of fluid with significant symptoms improvement. She is insisting on going home.  We would recommend to increase lasix to 40 mg PO daily, Add KCL 10 mEq daily, increase lisinopril to 40 mg po daily.  She hasn't followed with her cardiologist since the last cath in June 2015. We will call her tomorrow and try to arrange follow up in our [place] [facility].    Dorothy Spark 01/11/2014

## 2014-01-11 NOTE — ED Provider Notes (Signed)
CSN: [id number]     Arrival date & time 01/11/14  1710 History   First MD Initiated Contact with Patient 01/11/14 1714     Chief Complaint  Patient presents with  . Respiratory Distress     (Consider location/radiation/quality/duration/timing/severity/associated sxs/prior Treatment) HPI Comments: Hx limited as patient very tachypnic on arrival.  Patient is a 53 y.o. female presenting with shortness of breath. The history is provided by the patient.  Shortness of Breath Severity:  Moderate Onset quality:  Gradual Duration:  2 days Timing:  Constant Progression:  Worsening Chronicity:  New Context: not URI   Context comment:  Pt states she has hx of chest pain, back pain, SOB for 2 days.  Relieved by:  Nothing Worsened by:  Nothing tried Ineffective treatments:  None tried Associated symptoms: chest pain and diaphoresis   Associated symptoms: no abdominal pain, no claudication, no cough, no fever, no headaches, no rash, no sputum production, no vomiting and no wheezing   Risk factors: obesity and tobacco use   Risk factors: no family hx of DVT, no hx of PE/DVT and no recent surgery   Risk factors comment:  Hx of MI in past   Past Medical History  Diagnosis Date  . Diabetes mellitus   . Hypertension   . Thoracic or lumbosacral neuritis or radiculitis, unspecified   . CAD S/P percutaneous coronary angioplasty     s/p BMS stent to RCA ~2000; PTCA of ISR in 2001  . Insomnia   . Chronic combined systolic and diastolic CHF (congestive heart failure)   . HLD (hyperlipidemia)   . HTN (hypertension), malignant    Past Surgical History  Procedure Laterality Date  . Lymph node resection    . Coronary artery bypass graft    . Partial hysterectomy    . Carpal tunnel release      BILATERAL   Family History  Problem Relation Age of Onset  . Coronary artery disease Mother   . Hypertension Mother   . Diabetes Father   . Stroke Mother   . Stroke      sibling  . Coronary artery  disease      sibling  . Diabetes      sibling  . Hypertension      sibling   History  Substance Use Topics  . Smoking status: Current Every Day Smoker -- 1.00 packs/day for 40 years    Types: Cigarettes  . Smokeless tobacco: Never Used  . Alcohol Use: 1.2 oz/week    2 Cans of beer per week   OB History   Grav Para Term Preterm Abortions TAB SAB Ect Mult Living                 Review of Systems  Constitutional: Positive for diaphoresis. Negative for fever, activity change, appetite change and fatigue.  HENT: Negative for congestion and rhinorrhea.   Eyes: Negative for discharge, redness and itching.  Respiratory: Positive for shortness of breath. Negative for cough, sputum production and wheezing.   Cardiovascular: Positive for chest pain. Negative for claudication.  Gastrointestinal: Negative for nausea, vomiting, abdominal pain and diarrhea.  Genitourinary: Negative for dysuria and hematuria.  Musculoskeletal: Negative for back pain.  Skin: Negative for rash and wound.  Neurological: Negative for syncope and headaches.      Allergies  Review of patient's allergies indicates no known allergies.  Home Medications   Prior to Admission medications   Medication Sig Start Date End Date Taking? Authorizing Provider  aspirin EC 81 MG tablet Take 81 mg by mouth daily.    Yes Historical Provider, MD  carvedilol (COREG) 25 MG tablet Take 25 mg by mouth 2 (two) times daily with a meal.     Yes Historical Provider, MD  clopidogrel (PLAVIX) 75 MG tablet Take 75 mg by mouth daily.     Yes Historical Provider, MD  furosemide (LASIX) 20 MG tablet Take 20 mg by mouth daily.    Yes Historical Provider, MD  glipiZIDE (GLUCOTROL XL) 5 MG 24 hr tablet Take 5 mg by mouth daily.   Yes Historical Provider, MD  isosorbide mononitrate (IMDUR) 30 MG 24 hr tablet Take 30 mg by mouth daily.   Yes Historical Provider, MD  lisinopril (PRINIVIL,ZESTRIL) 20 MG tablet Take 1 tablet (20 mg total) by  mouth daily. 09/30/13  Yes Eileen Stanford, PA-C  metFORMIN (GLUCOPHAGE) 1000 MG tablet Take 1 tablet (1,000 mg total) by mouth 2 (two) times daily with a meal. 10/01/13  Yes Eileen Stanford, PA-C  oxyCODONE-acetaminophen (PERCOCET/ROXICET) 5-325 MG per tablet Take 1 tablet by mouth every 4 (four) hours as needed for severe pain.   Yes Historical Provider, MD  pravastatin (PRAVACHOL) 80 MG tablet Take 1 tablet (80 mg total) by mouth at bedtime. 09/30/13  Yes Eileen Stanford, PA-C  pregabalin (LYRICA) 150 MG capsule Take 150 mg by mouth 2 (two) times daily.   Yes Historical Provider, MD  nitroGLYCERIN (NITROSTAT) 0.4 MG SL tablet Place 0.4 mg under the tongue every 5 (five) minutes as needed for chest pain.     Historical Provider, MD   BP 132/65  Pulse 81  Temp(Src) 98 F (36.7 C) (Oral)  Resp 23  SpO2 95% Physical Exam  Constitutional: She is oriented to person, place, and time. She appears well-developed and well-nourished. She appears distressed (in resp distress).  HENT:  Head: Normocephalic and atraumatic.  Mouth/Throat: Oropharynx is clear and moist. No oropharyngeal exudate.  Eyes: Conjunctivae and EOM are normal. Pupils are equal, round, and reactive to light. Right eye exhibits no discharge. Left eye exhibits no discharge. No scleral icterus.  Neck: Normal range of motion. Neck supple.  Cardiovascular: Regular rhythm and normal heart sounds.   No murmur heard. Tachy, reg  Pulmonary/Chest: Breath sounds normal. She is in respiratory distress. She has no wheezes. She has no rales.  Diffuse rhonchi, breathing 30-40x a minute, cannot speak full sentences  Abdominal: Soft. She exhibits no distension and no mass. There is no tenderness.  Neurological: She is alert and oriented to person, place, and time. She exhibits normal muscle tone. Coordination normal.  Skin: Skin is warm. No rash noted. She is not diaphoretic.    ED Course  Procedures (including critical care time) Labs  Review Labs Reviewed  CBC WITH DIFFERENTIAL - Abnormal; Notable for the following:    RBC 3.75 (*)    Hemoglobin 11.7 (*)    HCT 34.9 (*)    All other components within normal limits  BASIC METABOLIC PANEL - Abnormal; Notable for the following:    Glucose, Bld 168 (*)    All other components within normal limits  PRO B NATRIURETIC PEPTIDE - Abnormal; Notable for the following:    Pro B Natriuretic peptide (BNP) 2952.0 (*)    All other components within normal limits  I-STAT ARTERIAL BLOOD GAS, ED - Abnormal; Notable for the following:    pH, Arterial 7.521 (*)    pCO2 arterial 29.5 (*)    pO2,  Arterial 61.0 (*)    Bicarbonate 24.2 (*)    All other components within normal limits  BLOOD GAS, ARTERIAL  URINALYSIS, ROUTINE W REFLEX MICROSCOPIC  I-STAT TROPOININ, ED  I-STAT CG4 LACTIC ACID, ED    Imaging Review Dg Chest Portable 1 View  01/11/2014   CLINICAL DATA:  Upper back pain.  Short of breath.  EXAM: PORTABLE CHEST - 1 VIEW  COMPARISON:  09/28/2013.  FINDINGS: Cardiopericardial silhouette upper limits of normal for projection. Diffuse bilateral airspace disease is present, most compatible with moderate alveolar pulmonary edema. The airspace disease is worse than on the prior exam 09/28/2013. Monitoring leads project over the chest. Suspect tiny bilateral pleural effusions.  IMPRESSION: Moderate CHF.   Electronically Signed   By: Dereck Ligas M.D.   On: 01/11/2014 17:35     EKG Interpretation   Date/Time:  Monday January 11 2014 17:12:14 EDT Ventricular Rate:  110 PR Interval:  146 QRS Duration: 151 QT Interval:  394 QTC Calculation: 533 R Axis:   21 Text Interpretation:  Sinus tachycardia Left bundle branch block Artifact  in lead(s) I II III aVR aVL aVF V4 V5 V6 and baseline wander in lead(s) II  III aVL aVF V5 V6 Confirmed by GOLDSTON  MD, SCOTT ([id number]) on 01/11/2014  5:38:35 PM      MDM   MDM: 53 y.o. AAF w/ PMHx of CHF, CAD, HTN, HLD, DM w/ cc: of SOB. Looks  like CF. Here tachy, tachypnic, afebrile. CXR wet. BNP elevated. Patient placedo n Bipap. BP 140/80, NTG held. Lasix given. D/w cards, who saw patient. Before cards could tell me plan, patient had eloped from ED. I saw her briefly to this when I walked by and saw her standing in room off Bipap. I asked her if she wanted to leave, to which she said yes and told her I would speka to the cardiologist and she agreed to wait for that. Nurse told me she had eloped about 5 minutes after this. I was unable to give her med recs in Cards note, nor was I able to provide discharge instructions.  Final diagnoses:  Acute on chronic systolic and diastolic heart failure, NYHA class 3  CAD S/P percutaneous coronary angioplasty  HLD (hyperlipidemia)  Essential hypertension   Eloped    Sol Passer, MD 01/11/14 2340

## 2014-01-11 NOTE — Progress Notes (Signed)
RT went to check on patient. Patient had removed herself from BIPAP. Stating " I feel better. This has got to go." Vital signs stable. Patient is on room air. No complications. RT will continue to monitor.

## 2014-01-12 ENCOUNTER — Telehealth: Payer: Self-pay | Admitting: Physician Assistant

## 2014-01-12 NOTE — Telephone Encounter (Signed)
Can we schedule her for close followup in clinic Also can we call to see if her CHF symptoms are improving

## 2014-01-12 NOTE — ED Provider Notes (Signed)
I saw and evaluated the patient, reviewed the resident's note and I agree with the findings and plan.   EKG Interpretation   Date/Time:  Monday January 11 2014 17:12:14 EDT Ventricular Rate:  110 PR Interval:  146 QRS Duration: 151 QT Interval:  394 QTC Calculation: 533 R Axis:   21 Text Interpretation:  Sinus tachycardia Left bundle branch block Artifact  in lead(s) I II III aVR aVL aVF V4 V5 V6 and baseline wander in lead(s) II  III aVL aVF V5 V6 Confirmed by Dshaun Reppucci  MD, Bryttany Tortorelli ([id number]) on 01/11/2014  5:38:35 PM      Patient presented with acute respiratory distress. Her history of hypertension and CHF is most consistent with acute pulmonary edema. BiPAP controlled most of her symptoms. Initially were going to put on nitroglycerin drip but her hypertension seemed to spontaneously resolve. No chest pain with this. Discussed with cardiology who came and evaluated the patient. Patient stated apparently she wanted to leave and eloped. She was apparently significantly better and walked out on her own.   CRITICAL CARE Performed by: Sherwood Gambler T   Total critical care time: 30 minutes  Critical care time was exclusive of separately billable procedures and treating other patients.  Critical care was necessary to treat or prevent imminent or life-threatening deterioration.  Critical care was time spent personally by me on the following activities: development of treatment plan with patient and/or surrogate as well as nursing, discussions with consultants, evaluation of patient's response to treatment, examination of patient, obtaining history from patient or surrogate, ordering and performing treatments and interventions, ordering and review of laboratory studies, ordering and review of radiographic studies, pulse oximetry and re-evaluation of patient's condition.   Ephraim Hamburger, MD 01/12/14 [phone]

## 2014-01-13 NOTE — Telephone Encounter (Signed)
Amend previous phone call: recent cath with no PCI

## 2014-01-13 NOTE — Telephone Encounter (Signed)
She is sched to see you 01/20/14. Do you want to work her in sooner?

## 2014-01-17 NOTE — Telephone Encounter (Signed)
That should be fine. 

## 2014-01-20 ENCOUNTER — Ambulatory Visit: Payer: Medicare Other | Admitting: Cardiovascular Disease

## 2014-03-25 ENCOUNTER — Encounter (HOSPITAL_COMMUNITY): Payer: Self-pay | Admitting: Cardiology

## 2014-08-18 ENCOUNTER — Telehealth: Payer: Self-pay

## 2014-08-18 NOTE — Telephone Encounter (Signed)
L mom for pt to schedule appt per pt PCP for chronic ischemic heart disease, has seen Dr. Rockey Situ in 10/2011

## 2014-10-27 ENCOUNTER — Encounter: Payer: Self-pay | Admitting: Urgent Care

## 2014-10-27 DIAGNOSIS — Z720 Tobacco use: Secondary | ICD-10-CM | POA: Insufficient documentation

## 2014-10-27 DIAGNOSIS — E119 Type 2 diabetes mellitus without complications: Secondary | ICD-10-CM | POA: Insufficient documentation

## 2014-10-27 DIAGNOSIS — R109 Unspecified abdominal pain: Secondary | ICD-10-CM | POA: Diagnosis present

## 2014-10-27 DIAGNOSIS — I10 Essential (primary) hypertension: Secondary | ICD-10-CM | POA: Diagnosis not present

## 2014-10-27 LAB — COMPREHENSIVE METABOLIC PANEL
ALK PHOS: 79 U/L (ref 38–126)
ALT: 9 U/L — ABNORMAL LOW (ref 14–54)
AST: 14 U/L — AB (ref 15–41)
Albumin: 4 g/dL (ref 3.5–5.0)
Anion gap: 11 (ref 5–15)
BUN: 13 mg/dL (ref 6–20)
CHLORIDE: 104 mmol/L (ref 101–111)
CO2: 22 mmol/L (ref 22–32)
Calcium: 9.9 mg/dL (ref 8.9–10.3)
Creatinine, Ser: 0.98 mg/dL (ref 0.44–1.00)
GFR calc Af Amer: >60 mL/min (ref >60)
GLUCOSE: 124 mg/dL — AB (ref 65–99)
POTASSIUM: 4.2 mmol/L (ref 3.5–5.1)
Sodium: 137 mmol/L (ref 135–145)
Total Bilirubin: 0.8 mg/dL (ref 0.3–1.2)
Total Protein: 7.9 g/dL (ref 6.5–8.1)

## 2014-10-27 LAB — CBC
HCT: 40.8 % (ref 35.0–47.0)
Hemoglobin: 13.5 g/dL (ref 12.0–16.0)
MCH: 30.8 pg (ref 26.0–34.0)
MCHC: 33 g/dL (ref 32.0–36.0)
MCV: 93.3 fL (ref 80.0–100.0)
Platelets: 264 10*3/uL (ref 150–440)
RBC: 4.37 MIL/uL (ref 3.80–5.20)
RDW: 15.9 % — AB (ref 11.5–14.5)
WBC: 9.1 10*3/uL (ref 3.6–11.0)

## 2014-10-27 LAB — LIPASE, BLOOD: Lipase: 25 U/L (ref 22–51)

## 2014-10-27 NOTE — ED Notes (Signed)
Patient presents with c/o lower abd pain since Friday night. (+) diarrhrea initially; has resolved. (+) nausea at present. Denies dysuria.

## 2014-10-27 NOTE — ED Notes (Signed)
Patient unable to void at this time. Reporting that her PCP recently advised her of what sounds like stage III CKD. No urine collected - advised patient should she need to void to make staff aware at registration desk.

## 2014-10-28 ENCOUNTER — Emergency Department
Admission: EM | Admit: 2014-10-28 | Discharge: 2014-10-28 | Discharge status: Against Medical Advice | Payer: Medicare PPO | Attending: Emergency Medicine | Admitting: Emergency Medicine

## 2014-10-28 ENCOUNTER — Emergency Department (HOSPITAL_COMMUNITY)
Admission: EM | Admit: 2014-10-28 | Discharge: 2014-10-28 | Disposition: A | Discharge status: Home / Self Care | Payer: Medicare PPO | Attending: Emergency Medicine | Admitting: Emergency Medicine

## 2014-10-28 ENCOUNTER — Emergency Department (HOSPITAL_COMMUNITY): Payer: Medicare PPO

## 2014-10-28 ENCOUNTER — Encounter (HOSPITAL_COMMUNITY): Payer: Self-pay | Admitting: Neurology

## 2014-10-28 DIAGNOSIS — J449 Chronic obstructive pulmonary disease, unspecified: Secondary | ICD-10-CM | POA: Diagnosis not present

## 2014-10-28 DIAGNOSIS — K5900 Constipation, unspecified: Secondary | ICD-10-CM

## 2014-10-28 DIAGNOSIS — R630 Anorexia: Secondary | ICD-10-CM | POA: Diagnosis not present

## 2014-10-28 DIAGNOSIS — Z87448 Personal history of other diseases of urinary system: Secondary | ICD-10-CM | POA: Diagnosis not present

## 2014-10-28 DIAGNOSIS — Z720 Tobacco use: Secondary | ICD-10-CM | POA: Diagnosis not present

## 2014-10-28 DIAGNOSIS — Z7902 Long term (current) use of antithrombotics/antiplatelets: Secondary | ICD-10-CM | POA: Insufficient documentation

## 2014-10-28 DIAGNOSIS — E119 Type 2 diabetes mellitus without complications: Secondary | ICD-10-CM | POA: Insufficient documentation

## 2014-10-28 DIAGNOSIS — E785 Hyperlipidemia, unspecified: Secondary | ICD-10-CM | POA: Diagnosis not present

## 2014-10-28 DIAGNOSIS — R110 Nausea: Secondary | ICD-10-CM | POA: Diagnosis not present

## 2014-10-28 DIAGNOSIS — R1030 Lower abdominal pain, unspecified: Secondary | ICD-10-CM | POA: Diagnosis present

## 2014-10-28 DIAGNOSIS — Z9861 Coronary angioplasty status: Secondary | ICD-10-CM | POA: Diagnosis not present

## 2014-10-28 DIAGNOSIS — I5042 Chronic combined systolic (congestive) and diastolic (congestive) heart failure: Secondary | ICD-10-CM | POA: Diagnosis not present

## 2014-10-28 DIAGNOSIS — Z7982 Long term (current) use of aspirin: Secondary | ICD-10-CM | POA: Diagnosis not present

## 2014-10-28 DIAGNOSIS — Z951 Presence of aortocoronary bypass graft: Secondary | ICD-10-CM | POA: Insufficient documentation

## 2014-10-28 DIAGNOSIS — I10 Essential (primary) hypertension: Secondary | ICD-10-CM | POA: Diagnosis not present

## 2014-10-28 DIAGNOSIS — I2510 Atherosclerotic heart disease of native coronary artery without angina pectoris: Secondary | ICD-10-CM | POA: Diagnosis not present

## 2014-10-28 DIAGNOSIS — Z79899 Other long term (current) drug therapy: Secondary | ICD-10-CM | POA: Diagnosis not present

## 2014-10-28 HISTORY — DX: Chronic obstructive pulmonary disease, unspecified: J44.9

## 2014-10-28 LAB — CBC
HCT: 40.8 % (ref 36.0–46.0)
Hemoglobin: 13.8 g/dL (ref 12.0–15.0)
MCH: 31.2 pg (ref 26.0–34.0)
MCHC: 33.8 g/dL (ref 30.0–36.0)
MCV: 92.3 fL (ref 78.0–100.0)
PLATELETS: 281 10*3/uL (ref 150–400)
RBC: 4.42 MIL/uL (ref 3.87–5.11)
RDW: 15.1 % (ref 11.5–15.5)
WBC: 8.9 10*3/uL (ref 4.0–10.5)

## 2014-10-28 LAB — COMPREHENSIVE METABOLIC PANEL
ALBUMIN: 3.8 g/dL (ref 3.5–5.0)
ALT: 11 U/L — ABNORMAL LOW (ref 14–54)
AST: 16 U/L (ref 15–41)
Alkaline Phosphatase: 81 U/L (ref 38–126)
Anion gap: 12 (ref 5–15)
BUN: 10 mg/dL (ref 6–20)
CALCIUM: 9.8 mg/dL (ref 8.9–10.3)
CO2: 21 mmol/L — ABNORMAL LOW (ref 22–32)
CREATININE: 0.91 mg/dL (ref 0.44–1.00)
Chloride: 104 mmol/L (ref 101–111)
GFR calc Af Amer: >60 mL/min (ref >60)
GFR calc non Af Amer: >60 mL/min (ref >60)
GLUCOSE: 138 mg/dL — AB (ref 65–99)
POTASSIUM: 4.1 mmol/L (ref 3.5–5.1)
SODIUM: 137 mmol/L (ref 135–145)
Total Bilirubin: 1.1 mg/dL (ref 0.3–1.2)
Total Protein: 7.6 g/dL (ref 6.5–8.1)

## 2014-10-28 LAB — URINE MICROSCOPIC-ADD ON

## 2014-10-28 LAB — URINALYSIS, ROUTINE W REFLEX MICROSCOPIC
GLUCOSE, UA: NEGATIVE mg/dL
Ketones, ur: NEGATIVE mg/dL
Leukocytes, UA: NEGATIVE
NITRITE: NEGATIVE
PH: 5 (ref 5.0–8.0)
Protein, ur: 100 mg/dL — AB
Specific Gravity, Urine: 1.018 (ref 1.005–1.030)
Urobilinogen, UA: 1 mg/dL (ref 0.0–1.0)

## 2014-10-28 LAB — LIPASE, BLOOD: Lipase: 24 U/L (ref 22–51)

## 2014-10-28 MED ORDER — ONDANSETRON HCL 4 MG/2ML IJ SOLN
4.0000 mg | Freq: Once | INTRAMUSCULAR | Status: AC
  Administered 2014-10-28: 4 mg via INTRAVENOUS
  Filled 2014-10-28: qty 2

## 2014-10-28 MED ORDER — IOHEXOL 300 MG/ML  SOLN
75.0000 mL | Freq: Once | INTRAMUSCULAR | Status: AC | PRN
  Administered 2014-10-28: 75 mL via INTRAVENOUS

## 2014-10-28 MED ORDER — IOHEXOL 300 MG/ML  SOLN
25.0000 mL | Freq: Once | INTRAMUSCULAR | Status: AC | PRN
  Administered 2014-10-28: 25 mL via ORAL

## 2014-10-28 NOTE — ED Notes (Addendum)
Pt requesting to go outside to smoke,  RN educated pt on policy and procedure in the ED, also that we are a smoke free campus.  Pt encouraged to stay until her CT scan results. Pt agreed.

## 2014-10-28 NOTE — ED Provider Notes (Signed)
CSN: [id number]     Arrival date & time 10/28/14  [id number] History   First MD Initiated Contact with Patient 10/28/14 0914     Chief Complaint  Patient presents with  . Abdominal Pain     (Consider location/radiation/quality/duration/timing/severity/associated sxs/prior Treatment) Patient is a 54 y.o. female presenting with abdominal pain. The history is provided by the patient. No language interpreter was used.  Abdominal Pain Pain location:  Suprapubic Pain quality: aching   Pain radiates to:  L flank Pain severity:  Moderate Duration:  2 days Timing:  Constant Progression:  Worsening Chronicity:  New Context: previous surgery   Context: not recent illness, not recent sexual activity, not recent travel, not sick contacts, not suspicious food intake and not trauma   Relieved by:  Nothing Worsened by:  Nothing tried Associated symptoms: anorexia, diarrhea (now resolved) and nausea   Associated symptoms: no chest pain, no chills, no cough, no dysuria, no fatigue, no fever, no flatus, no shortness of breath, no sore throat and no vomiting   Risk factors: being elderly   Risk factors: no alcohol abuse, no aspirin use, has not had multiple surgeries, not pregnant and no recent hospitalization     Past Medical History  Diagnosis Date  . Diabetes mellitus   . Hypertension   . Thoracic or lumbosacral neuritis or radiculitis, unspecified   . CAD S/P percutaneous coronary angioplasty     s/p BMS stent to RCA ~2000; PTCA of ISR in 2001  . Insomnia   . Chronic combined systolic and diastolic CHF (congestive heart failure)   . HLD (hyperlipidemia)   . HTN (hypertension), malignant   . Renal disorder   . COPD (chronic obstructive pulmonary disease)   . Renal insufficiency    Past Surgical History  Procedure Laterality Date  . Lymph node resection    . Coronary artery bypass graft    . Partial hysterectomy    . Carpal tunnel release      BILATERAL  . Left heart catheterization with  coronary angiogram N/A 09/28/2013    Procedure: LEFT HEART CATHETERIZATION WITH CORONARY ANGIOGRAM;  Surgeon: Leonie Man, MD;  Location: [facility] CATH LAB;  Service: Cardiovascular;  Laterality: N/A;   Family History  Problem Relation Age of Onset  . Coronary artery disease Mother   . Hypertension Mother   . Diabetes Father   . Stroke Mother   . Stroke      sibling  . Coronary artery disease      sibling  . Diabetes      sibling  . Hypertension      sibling   History  Substance Use Topics  . Smoking status: Current Every Day Smoker -- 1.00 packs/day for 40 years    Types: Cigarettes  . Smokeless tobacco: Never Used  . Alcohol Use: 1.2 oz/week    2 Cans of beer per week   OB History    No data available     Review of Systems  Constitutional: Negative for fever, chills, diaphoresis, activity change, appetite change and fatigue.  HENT: Negative for congestion, facial swelling, rhinorrhea and sore throat.   Eyes: Negative for photophobia and discharge.  Respiratory: Negative for cough, chest tightness and shortness of breath.   Cardiovascular: Negative for chest pain, palpitations and leg swelling.  Gastrointestinal: Positive for nausea, abdominal pain, diarrhea (now resolved) and anorexia. Negative for vomiting and flatus.  Endocrine: Negative for polydipsia and polyuria.  Genitourinary: Negative for dysuria, frequency, difficulty urinating and  pelvic pain.  Musculoskeletal: Negative for back pain, arthralgias, neck pain and neck stiffness.  Skin: Negative for color change and wound.  Allergic/Immunologic: Negative for immunocompromised state.  Neurological: Negative for facial asymmetry, weakness, numbness and headaches.  Hematological: Does not bruise/bleed easily.  Psychiatric/Behavioral: Negative for confusion and agitation.      Allergies  Review of patient's allergies indicates no known allergies.  Home Medications   Prior to Admission medications   Medication  Sig Start Date End Date Taking? Authorizing Provider  aspirin EC 81 MG tablet Take 81 mg by mouth daily.     Historical Provider, MD  carvedilol (COREG) 25 MG tablet Take 25 mg by mouth 2 (two) times daily with a meal.      Historical Provider, MD  clopidogrel (PLAVIX) 75 MG tablet Take 75 mg by mouth daily.      Historical Provider, MD  furosemide (LASIX) 20 MG tablet Take 20 mg by mouth daily.     Historical Provider, MD  glipiZIDE (GLUCOTROL XL) 5 MG 24 hr tablet Take 5 mg by mouth daily.    Historical Provider, MD  isosorbide mononitrate (IMDUR) 30 MG 24 hr tablet Take 30 mg by mouth daily.    Historical Provider, MD  lisinopril (PRINIVIL,ZESTRIL) 20 MG tablet Take 1 tablet (20 mg total) by mouth daily. 09/30/13   Eileen Stanford, PA-C  metFORMIN (GLUCOPHAGE) 1000 MG tablet Take 1 tablet (1,000 mg total) by mouth 2 (two) times daily with a meal. 10/01/13   Eileen Stanford, PA-C  nitroGLYCERIN (NITROSTAT) 0.4 MG SL tablet Place 0.4 mg under the tongue every 5 (five) minutes as needed for chest pain.     Historical Provider, MD  oxyCODONE-acetaminophen (PERCOCET/ROXICET) 5-325 MG per tablet Take 1 tablet by mouth every 4 (four) hours as needed for severe pain.    Historical Provider, MD  pravastatin (PRAVACHOL) 80 MG tablet Take 1 tablet (80 mg total) by mouth at bedtime. 09/30/13   Eileen Stanford, PA-C  pregabalin (LYRICA) 150 MG capsule Take 150 mg by mouth 2 (two) times daily.    Historical Provider, MD   BP 162/75 mmHg  Pulse 80  Temp(Src) 98.3 F (36.8 C) (Oral)  Resp 19  Ht 5\' 6"  (1.676 m)  Wt 168 lb (76.204 kg)  BMI 27.13 kg/m2  SpO2 100% Physical Exam  Constitutional: She is oriented to person, place, and time. She appears well-developed and well-nourished. No distress.  HENT:  Head: Normocephalic and atraumatic.  Mouth/Throat: No oropharyngeal exudate.  Eyes: Pupils are equal, round, and reactive to light.  Neck: Normal range of motion. Neck supple.  Cardiovascular:  Normal rate, regular rhythm and normal heart sounds.  Exam reveals no gallop and no friction rub.   No murmur heard. Pulmonary/Chest: Effort normal and breath sounds normal. No respiratory distress. She has no wheezes. She has no rales.  Abdominal: Soft. Bowel sounds are normal. She exhibits no distension and no mass. There is tenderness in the suprapubic area. There is CVA tenderness (left). There is no rebound and no guarding.  Musculoskeletal: Normal range of motion. She exhibits no edema or tenderness.  Neurological: She is alert and oriented to person, place, and time.  Skin: Skin is warm and dry.  Psychiatric: She has a normal mood and affect.    ED Course  Procedures (including critical care time) Labs Review Labs Reviewed  COMPREHENSIVE METABOLIC PANEL - Abnormal; Notable for the following:    CO2 21 (*)    Glucose,  Bld 138 (*)    ALT 11 (*)    All other components within normal limits  URINALYSIS, ROUTINE W REFLEX MICROSCOPIC (NOT AT [facility]) - Abnormal; Notable for the following:    Color, Urine AMBER (*)    APPearance CLOUDY (*)    Hgb urine dipstick SMALL (*)    Bilirubin Urine SMALL (*)    Protein, ur 100 (*)    All other components within normal limits  URINE MICROSCOPIC-ADD ON - Abnormal; Notable for the following:    Squamous Epithelial / LPF FEW (*)    Bacteria, UA FEW (*)    Casts HYALINE CASTS (*)    All other components within normal limits  LIPASE, BLOOD  CBC    Imaging Review Ct Abdomen Pelvis W Contrast  10/28/2014   CLINICAL DATA:  Lower abdominal pain starting Friday night, diarrhea, nausea  EXAM: CT ABDOMEN AND PELVIS WITH CONTRAST  TECHNIQUE: Multidetector CT imaging of the abdomen and pelvis was performed using the standard protocol following bolus administration of intravenous contrast.  CONTRAST:  45mL OMNIPAQUE IOHEXOL 300 MG/ML  SOLN  COMPARISON:  05/22/2011.  FINDINGS: Images of the lung bases shows multiple tiny bilateral ground-glass nodules.  Atypical pneumonitis infection or interstitial lung disease cannot be excluded. Clinical correlation is necessary. Stable 5 mm noncalcified nodule in right lower lobe see axial image 6.  Enhanced liver is unremarkable. Small hiatal hernia. Gallbladder is contracted without evidence of calcified gallstones. The pancreas, spleen and adrenal glands are unremarkable. Atherosclerotic calcifications of abdominal aorta and iliac arteries. Atherosclerotic calcifications are noted bilateral renal artery origin. Kidneys are symmetrical in size and enhancement. No hydronephrosis or hydroureter.  There is no small bowel obstruction. No ascites or free air. No adenopathy. Moderate stool noted in right colon and transverse colon. No pericecal inflammation. Normal appendix. The terminal ileum is unremarkable. There is under distended urinary bladder. The patient is status post hysterectomy. No distal colonic obstruction. There is no inguinal adenopathy.  Sagittal images shows mild degenerative changes lumbar spine.  Delayed renal images shows bilateral renal symmetrical excretion. Bilateral visualized proximal ureter is unremarkable.  IMPRESSION: 1. There are tiny multiple ground-glass nodules bilateral lower lobe. Atypical pneumonitis, interstitial lung disease or infection cannot be excluded. Less likely metastatic disease. Clinical correlation is necessary. Stable 5 mm noncalcified nodule in right lower lobe laterally. 2. No acute inflammatory process within abdomen or pelvis. 3. No hydronephrosis or hydroureter. 4. Atherosclerotic calcifications of abdominal aorta and iliac arteries. 5. Moderate stool in right colon and transverse colon. No pericecal inflammation. Normal appendix. 6. Status post hysterectomy.   Electronically Signed   By: Lahoma Crocker M.D.   On: 10/28/2014 12:40     EKG Interpretation None      MDM   Final diagnoses:  Suprapubic pain, acute, unspecified laterality  Constipation, unspecified  constipation type    Pt is a 54 y.o. female with Pmhx as above who presents with 2 days of constant low abdominal pain with radiation to the back.  Initially hard onset of pain.  She had 2 days of frequent watery diarrhea without blood.  She took an Imodium and has not had BM and is also not been passing flatus since.  She has associated nausea but no vomiting.  No urinary symptoms.  No fevers or chills.  She says she was recently diagnosed with chronic kidney disease.  Denies recent antibiotics.  Suspicious food intake or travel.  On physical exam she is hypertensive, vital signs otherwise  stable and she is in no acute distress.  She has suprapubic and left CVA pain.  Bowel sounds are normal.  No rebound or guarding.  CBC, CMP, lipase are grossly normal.  We'll plan on CT, abdominal imaging.    Patient is feeling improved.  She states her soreness has improved.  Her CT scan is no acute findings except for possible atypical infection.  On lung fields which is not consistent with her physical exam.  She does have moderate stool burden, which is likely from recent use of Imodium.  She has taken a laxative yesterday and I have encouraged her that she can take it again today or switch to something else like a stool softener or MiraLAX.  [place] evaluation in the Emergency Department is complete. It has been determined that no acute conditions requiring further emergency intervention are present at this time. The patient/guardian have been advised of the diagnosis and plan. We have discussed signs and symptoms that warrant return to the ED, such as changes or worsening in symptoms, worsening pain, fever, inability to tolerate liquids    Ernestina Patches, MD 10/28/14 1326

## 2014-10-28 NOTE — ED Notes (Signed)
Pt reports lower abd pain and lower back pain for 3 days. No n/v/d.

## 2014-10-28 NOTE — ED Notes (Signed)
Pt refusing to stay to review d/c paperwork.  Pt verbalized understanding of follow up and precautions.  Pt a x 4, NAD, ambulatory to discharge.

## 2014-10-28 NOTE — ED Notes (Signed)
Called name times 3  to take to room at [phone] no answer.

## 2014-10-28 NOTE — ED Notes (Signed)
MD at bedside. 

## 2014-11-05 DIAGNOSIS — R918 Other nonspecific abnormal finding of lung field: Secondary | ICD-10-CM | POA: Insufficient documentation

## 2014-11-10 ENCOUNTER — Other Ambulatory Visit: Payer: Self-pay | Admitting: Family Medicine

## 2014-11-10 DIAGNOSIS — R918 Other nonspecific abnormal finding of lung field: Secondary | ICD-10-CM

## 2015-01-19 ENCOUNTER — Encounter: Payer: Self-pay | Admitting: *Deleted

## 2015-01-19 ENCOUNTER — Ambulatory Visit: Payer: Medicare PPO | Admitting: Cardiovascular Disease

## 2015-01-31 ENCOUNTER — Ambulatory Visit: Payer: Medicare PPO

## 2015-02-04 ENCOUNTER — Ambulatory Visit
Admission: RE | Admit: 2015-02-04 | Discharge: 2015-02-04 | Disposition: A | Discharge status: Home / Self Care | Payer: Medicare PPO | Source: Ambulatory Visit | Attending: Family Medicine | Admitting: Family Medicine

## 2015-02-04 DIAGNOSIS — R590 Localized enlarged lymph nodes: Secondary | ICD-10-CM | POA: Diagnosis not present

## 2015-02-04 DIAGNOSIS — R918 Other nonspecific abnormal finding of lung field: Secondary | ICD-10-CM | POA: Insufficient documentation

## 2015-02-04 DIAGNOSIS — I2510 Atherosclerotic heart disease of native coronary artery without angina pectoris: Secondary | ICD-10-CM | POA: Diagnosis not present

## 2015-03-08 ENCOUNTER — Encounter: Payer: Self-pay | Admitting: Nurse Practitioner

## 2015-03-08 ENCOUNTER — Ambulatory Visit (INDEPENDENT_AMBULATORY_CARE_PROVIDER_SITE_OTHER): Payer: Medicare PPO | Admitting: Nurse Practitioner

## 2015-03-08 ENCOUNTER — Ambulatory Visit: Payer: Medicare PPO | Admitting: Cardiovascular Disease

## 2015-03-08 VITALS — BP 180/97 | HR 88 | Ht 66.0 in | Wt 172.2 lb

## 2015-03-08 DIAGNOSIS — Z9861 Coronary angioplasty status: Secondary | ICD-10-CM

## 2015-03-08 DIAGNOSIS — I2510 Atherosclerotic heart disease of native coronary artery without angina pectoris: Secondary | ICD-10-CM

## 2015-03-08 DIAGNOSIS — I10 Essential (primary) hypertension: Secondary | ICD-10-CM | POA: Insufficient documentation

## 2015-03-08 DIAGNOSIS — N183 Chronic kidney disease, stage 3 unspecified: Secondary | ICD-10-CM | POA: Insufficient documentation

## 2015-03-08 DIAGNOSIS — I5043 Acute on chronic combined systolic (congestive) and diastolic (congestive) heart failure: Secondary | ICD-10-CM | POA: Diagnosis not present

## 2015-03-08 DIAGNOSIS — E119 Type 2 diabetes mellitus without complications: Secondary | ICD-10-CM | POA: Insufficient documentation

## 2015-03-08 NOTE — Patient Instructions (Signed)
Medication Instructions:  Your physician has recommended you make the following change in your medication:  INCREASE Lasix to 40mg  Twice daily for 3 days. Then RESUME 40mg  daily  Labwork: Bnp Today  Testing/Procedures: Your physician has requested that you have an echocardiogram. Echocardiography is a painless test that uses sound waves to create images of your heart. It provides your doctor with information about the size and shape of your heart and how well your heart's chambers and valves are working. This procedure takes approximately one hour. There are no restrictions for this procedure.   Follow-Up: Your physician recommends that you schedule a follow-up appointment in: 1 week   Any Other Special Instructions Will Be Listed Below (If Applicable).     If you need a refill on your cardiac medications before your next appointment, please call your pharmacy.

## 2015-03-08 NOTE — Progress Notes (Signed)
Patient Name: Tasha Myers Date of Encounter: 03/08/2015  Primary Care Provider:  Ballard Russell, MD Primary Cardiologist:  Johnny Bridge, MD   Chief Complaint  54 year old female with a history of ischemic cardiomyopathy and CAD who presents for follow-up related to dyspnea.  Past Medical History   Past Medical History  Diagnosis Date  . Type II diabetes mellitus ([place])   . Thoracic or lumbosacral neuritis or radiculitis, unspecified   . CAD S/P percutaneous coronary angioplasty     a. s/p BMS stent to RCA ~2000; b. PTCA of ISR in 2001; c. 09/2013 Cath: LM nl, LAD 61m, LCX min irregs, RI min irregs, RCA 20p ISR.  Marland Kitchen Insomnia   . Chronic combined systolic and diastolic CHF (congestive heart failure) ([place])     a. 09/2013 Echo: EF 45-50%, no rwma, Gr2 DD, mild AI, mildly dil LA.  Marland Kitchen HLD (hyperlipidemia)   . Essential hypertension   . COPD (chronic obstructive pulmonary disease) ([place])   . CKD (chronic kidney disease), stage III    Past Surgical History  Procedure Laterality Date  . Lymph node resection    . Coronary artery bypass graft    . Partial hysterectomy    . Carpal tunnel release      BILATERAL  . Left heart catheterization with coronary angiogram N/A 09/28/2013    Procedure: LEFT HEART CATHETERIZATION WITH CORONARY ANGIOGRAM;  Surgeon: Leonie Man, MD;  Location: [facility] CATH LAB;  Service: Cardiovascular;  Laterality: N/A;    Allergies  Allergies  Allergen Reactions  . Hydrocodone Nausea Only    HPI  54 year old female with a history of coronary artery disease status post inferior MI and RCA stenting in 2000 with repeat stenting a year later secondary to in-stent restenosis. She also has a history of ischemic cardiopathy and chronic combined systolic and diastolic heart failure. Her last admission was in June 2015 at [facility] secondary to chest pain. Catheterization revealed nonobstructive CAD and she is medically managed since. She is not followed up in  our office since then however. She reports compliance with her medications and generally does pretty well although in the past week, she's been experiencing more dyspnea on exertion and also orthopnea, requiring that she sit up to sleep at night. She's not been having any lower extremity edema, early satiety, PND, dizziness, syncope, or chest pain. She says that she does indulge in a fair amount of salt in her diet.  Home Medications  Prior to Admission medications   Medication Sig Start Date End Date Taking? Authorizing Provider  aspirin EC 81 MG tablet Take 81 mg by mouth daily.    Yes Historical Provider, MD  carvedilol (COREG) 25 MG tablet Take 25 mg by mouth 2 (two) times daily with a meal.     Yes Historical Provider, MD  clopidogrel (PLAVIX) 75 MG tablet Take 75 mg by mouth daily.     Yes Historical Provider, MD  furosemide (LASIX) 40 MG tablet Take 40 mg by mouth daily.   Yes Historical Provider, MD  glipiZIDE (GLUCOTROL XL) 5 MG 24 hr tablet Take 5 mg by mouth daily.   Yes Historical Provider, MD  isosorbide mononitrate (IMDUR) 30 MG 24 hr tablet Take 30 mg by mouth daily.   Yes Historical Provider, MD  lisinopril (PRINIVIL,ZESTRIL) 20 MG tablet Take 1 tablet (20 mg total) by mouth daily. 09/30/13  Yes Eileen Stanford, PA-C  metFORMIN (GLUCOPHAGE) 1000 MG tablet Take 1 tablet (1,000 mg total)  by mouth 2 (two) times daily with a meal. 10/01/13  Yes Eileen Stanford, PA-C  nitroGLYCERIN (NITROSTAT) 0.4 MG SL tablet Place 0.4 mg under the tongue every 5 (five) minutes as needed for chest pain.    Yes Historical Provider, MD  pravastatin (PRAVACHOL) 80 MG tablet Take 1 tablet (80 mg total) by mouth at bedtime. 09/30/13  Yes Eileen Stanford, PA-C  pregabalin (LYRICA) 150 MG capsule Take 150 mg by mouth 2 (two) times daily.   Yes Historical Provider, MD    Review of Systems  As above, she's been experiencing orthopnea as well as some increasing dyspnea on exertion. She believes her  weight is up about 45 pounds. She denies chest pain, palpitations, PND, his illness, syncope, edema, or early satiety.  All other systems reviewed and are otherwise negative except as noted above.  Physical Exam  VS:  BP 180/97 mmHg  Pulse 88  Ht 5\' 6"  (1.676 m)  Wt 172 lb 4 oz (78.132 kg)  BMI 27.82 kg/m2 , BMI Body mass index is 27.82 kg/(m^2). GEN: Well nourished, well developed, in no acute distress. HEENT: normal. Neck: Supple, no obvious JVD, carotid bruits, or masses. Cardiac: RRR, positive S4, no murmurs, rubs. No clubbing, cyanosis, edema.  Radials/DP/PT 2+ and equal bilaterally.  Respiratory:  Respirations regular and unlabored, clear to auscultation bilaterally. GI: Soft, nontender, nondistended, BS + x 4. MS: no deformity or atrophy. Skin: warm and dry, no rash. Neuro:  Strength and sensation are intact. Psych: Normal affect.  Accessory Clinical Findings  ECG - regular sinus rhythm, 88, left bundle branch block, no acute changes.  Assessment & Plan  1.  Acute on chronic combined systolic and diastolic congestive heart failure: Patient reports a one-week history of progressive dyspnea on exertion and orthopnea. On exam, she does not have evidence of significant volume overload though her weight is up 4-5 pounds by her report. Previous EF was 45-50% in June 2015. Her blood pressure is markedly elevated today at 180/97. She says that she typically runs better than this. I have reviewed records from [facility] and in fact her blood pressures are fairly variable and often are in the 120s upon office visits. I have asked her to increase her Lasix to 40 mg twice a day over the next 3 days and then to drop back down to 40 mg daily. She will otherwise remain on beta blocker and ACE inhibitor therapy. I will check a brain natruretic peptide today and also plan for follow-up echo to reevaluate LV function. We will see her back in clinic in 1 week.  2. CAD: She has not been having any chest  pain. She remains on aspirin, beta blocker, Plavix, nitrates, ACE inhibitor, and statin therapy.  3. Stage III chronic disease: Recent creatinine performed at [facility] was 1.7 BUN of 16. If she would require a higher dose of Lasix going forward, we will need to pay close attention to her renal function.  4. Essential hypertension: Blood pressure is elevated today. Review of prior blood pressures from [facility] records show that she is fairly variable but often in the 120s. We will increase diuretic for the next 3 days and see if this helps.  5. Hyperlipidemia: Followed at [facility]. Continue statin therapy.  6. Disposition: Follow-up in clinic next week.   Murray Hodgkins, NP 03/08/2015, 5:40 PM

## 2015-03-10 LAB — BRAIN NATRIURETIC PEPTIDE: BNP: 307.2 pg/mL — AB (ref 0.0–100.0)

## 2015-03-18 ENCOUNTER — Ambulatory Visit: Payer: Medicare PPO | Admitting: Nurse Practitioner

## 2015-03-23 ENCOUNTER — Other Ambulatory Visit: Payer: Self-pay

## 2015-03-23 ENCOUNTER — Ambulatory Visit (INDEPENDENT_AMBULATORY_CARE_PROVIDER_SITE_OTHER): Payer: Medicare PPO

## 2015-03-23 DIAGNOSIS — I5043 Acute on chronic combined systolic (congestive) and diastolic (congestive) heart failure: Secondary | ICD-10-CM

## 2015-03-25 ENCOUNTER — Ambulatory Visit: Payer: Medicare PPO | Admitting: Cardiovascular Disease

## 2015-05-25 ENCOUNTER — Other Ambulatory Visit: Payer: Self-pay | Admitting: Family Medicine

## 2015-06-10 ENCOUNTER — Encounter: Payer: Self-pay | Admitting: Emergency Medicine

## 2015-06-10 DIAGNOSIS — E119 Type 2 diabetes mellitus without complications: Secondary | ICD-10-CM | POA: Diagnosis not present

## 2015-06-10 DIAGNOSIS — I5043 Acute on chronic combined systolic (congestive) and diastolic (congestive) heart failure: Secondary | ICD-10-CM | POA: Diagnosis not present

## 2015-06-10 DIAGNOSIS — R110 Nausea: Secondary | ICD-10-CM | POA: Diagnosis not present

## 2015-06-10 DIAGNOSIS — Z79899 Other long term (current) drug therapy: Secondary | ICD-10-CM | POA: Diagnosis not present

## 2015-06-10 DIAGNOSIS — I129 Hypertensive chronic kidney disease with stage 1 through stage 4 chronic kidney disease, or unspecified chronic kidney disease: Secondary | ICD-10-CM | POA: Diagnosis not present

## 2015-06-10 DIAGNOSIS — R1084 Generalized abdominal pain: Secondary | ICD-10-CM | POA: Insufficient documentation

## 2015-06-10 DIAGNOSIS — N183 Chronic kidney disease, stage 3 (moderate): Secondary | ICD-10-CM | POA: Diagnosis not present

## 2015-06-10 DIAGNOSIS — Z7902 Long term (current) use of antithrombotics/antiplatelets: Secondary | ICD-10-CM | POA: Diagnosis not present

## 2015-06-10 DIAGNOSIS — I252 Old myocardial infarction: Secondary | ICD-10-CM | POA: Insufficient documentation

## 2015-06-10 DIAGNOSIS — R0789 Other chest pain: Secondary | ICD-10-CM | POA: Diagnosis not present

## 2015-06-10 DIAGNOSIS — F17210 Nicotine dependence, cigarettes, uncomplicated: Secondary | ICD-10-CM | POA: Insufficient documentation

## 2015-06-10 DIAGNOSIS — J441 Chronic obstructive pulmonary disease with (acute) exacerbation: Secondary | ICD-10-CM | POA: Diagnosis not present

## 2015-06-10 DIAGNOSIS — R079 Chest pain, unspecified: Secondary | ICD-10-CM | POA: Diagnosis present

## 2015-06-10 DIAGNOSIS — I2510 Atherosclerotic heart disease of native coronary artery without angina pectoris: Secondary | ICD-10-CM | POA: Insufficient documentation

## 2015-06-10 DIAGNOSIS — Z7982 Long term (current) use of aspirin: Secondary | ICD-10-CM | POA: Diagnosis not present

## 2015-06-10 DIAGNOSIS — Z7984 Long term (current) use of oral hypoglycemic drugs: Secondary | ICD-10-CM | POA: Insufficient documentation

## 2015-06-10 NOTE — ED Notes (Signed)
Patient here with complaint of midline chest pain radiating under her right breast that started about 09:00 this morning. Patient reports shortness of breath and nausea/vomiting. Patient has a history of MI.

## 2015-06-11 ENCOUNTER — Emergency Department: Payer: Medicare PPO

## 2015-06-11 ENCOUNTER — Emergency Department
Admission: EM | Admit: 2015-06-11 | Discharge: 2015-06-11 | Discharge status: Against Medical Advice | Payer: Medicare PPO | Attending: Emergency Medicine | Admitting: Emergency Medicine

## 2015-06-11 DIAGNOSIS — R0789 Other chest pain: Secondary | ICD-10-CM

## 2015-06-11 HISTORY — DX: Acute myocardial infarction, unspecified: I21.9

## 2015-06-11 LAB — CBC
HEMATOCRIT: 38.7 % (ref 35.0–47.0)
HEMOGLOBIN: 13.2 g/dL (ref 12.0–16.0)
MCH: 31 pg (ref 26.0–34.0)
MCHC: 34 g/dL (ref 32.0–36.0)
MCV: 91 fL (ref 80.0–100.0)
Platelets: 298 10*3/uL (ref 150–440)
RBC: 4.25 MIL/uL (ref 3.80–5.20)
RDW: 15.5 % — ABNORMAL HIGH (ref 11.5–14.5)
WBC: 9.5 10*3/uL (ref 3.6–11.0)

## 2015-06-11 LAB — BASIC METABOLIC PANEL
ANION GAP: 9 (ref 5–15)
BUN: 19 mg/dL (ref 6–20)
CALCIUM: 9.5 mg/dL (ref 8.9–10.3)
CO2: 29 mmol/L (ref 22–32)
Chloride: 99 mmol/L — ABNORMAL LOW (ref 101–111)
Creatinine, Ser: 1.23 mg/dL — ABNORMAL HIGH (ref 0.44–1.00)
GFR calc Af Amer: 56 mL/min — ABNORMAL LOW (ref >60)
GFR, EST NON AFRICAN AMERICAN: 48 mL/min — AB (ref >60)
GLUCOSE: 154 mg/dL — AB (ref 65–99)
Potassium: 4.2 mmol/L (ref 3.5–5.1)
Sodium: 137 mmol/L (ref 135–145)

## 2015-06-11 LAB — TROPONIN I: TROPONIN I: 0.04 ng/mL — AB (ref <0.031)

## 2015-06-11 MED ORDER — ONDANSETRON 4 MG PO TBDP
ORAL_TABLET | ORAL | Status: AC
  Administered 2015-06-11: 4 mg via ORAL
  Filled 2015-06-11: qty 1

## 2015-06-11 MED ORDER — MORPHINE SULFATE (PF) 2 MG/ML IV SOLN: 2.0000 mg | Freq: Once | INTRAVENOUS | Status: DC

## 2015-06-11 MED ORDER — ONDANSETRON HCL 4 MG/2ML IJ SOLN
4.0000 mg | Freq: Once | INTRAMUSCULAR | Status: DC
  Filled 2015-06-11: qty 2

## 2015-06-11 MED ORDER — ONDANSETRON 4 MG PO TBDP
4.0000 mg | ORAL_TABLET | Freq: Once | ORAL | Status: AC
  Administered 2015-06-11: 4 mg via ORAL

## 2015-06-11 MED ORDER — ASPIRIN 81 MG PO CHEW
324.0000 mg | CHEWABLE_TABLET | Freq: Once | ORAL | Status: AC
  Administered 2015-06-11: 324 mg via ORAL
  Filled 2015-06-11: qty 4

## 2015-06-11 NOTE — Discharge Instructions (Signed)
You have been seen in the Emergency Department (ED) today for chest pain.  As we have discussed, we wanted to keep you at least for a few hours to repeat some blood work to make sure that your heart enzyme (troponin) is not going up, but you have refused all of her treatments except for a full dose aspirin and a dose of antinausea medicine.  You understand that you are leaving [place] and that if you have a serious medical condition it may lead to permanent disability or even death.  You may return to the emergency department for additional evaluation at any time.  Please follow up with the recommended doctor as instructed above in these documents regarding todays emergent visit and your recent symptoms to discuss further management.  Continue to take your regular medications. If you are not doing so already, please also take a daily baby aspirin (81 mg), at least until you follow up with your doctor.  Return to the Emergency Department (ED) if you experience any further chest pain/pressure/tightness, difficulty breathing, or sudden sweating, or other symptoms that concern you.   Chest Pain (Nonspecific) It is often hard to give a specific diagnosis for the cause of chest pain. There is always a chance that your pain could be related to something serious, such as a heart attack or a blood clot in the lungs. You need to follow up with your health care provider for further evaluation. CAUSES   Heartburn.  Pneumonia or bronchitis.  Anxiety or stress.  Inflammation around your heart (pericarditis) or lung (pleuritis or pleurisy).  A blood clot in the lung.  A collapsed lung (pneumothorax). It can develop suddenly on its own (spontaneous pneumothorax) or from trauma to the chest.  Shingles infection (herpes zoster virus). The chest wall is composed of bones, muscles, and cartilage. Any of these can be the source of the pain.  The bones can be bruised by injury.  The muscles or  cartilage can be strained by coughing or overwork.  The cartilage can be affected by inflammation and become sore (costochondritis). DIAGNOSIS  Lab tests or other studies may be needed to find the cause of your pain. Your health care provider may have you take a test called an ambulatory electrocardiogram (ECG). An ECG records your heartbeat patterns over a 24-hour period. You may also have other tests, such as:  Transthoracic echocardiogram (TTE). During echocardiography, sound waves are used to evaluate how blood flows through your heart.  Transesophageal echocardiogram (TEE).  Cardiac monitoring. This allows your health care provider to monitor your heart rate and rhythm in real time.  Holter monitor. This is a portable device that records your heartbeat and can help diagnose heart arrhythmias. It allows your health care provider to track your heart activity for several days, if needed.  Stress tests by exercise or by giving medicine that makes the heart beat faster. TREATMENT   Treatment depends on what may be causing your chest pain. Treatment may include:  Acid blockers for heartburn.  Anti-inflammatory medicine.  Pain medicine for inflammatory conditions.  Antibiotics if an infection is present.  You may be advised to change lifestyle habits. This includes stopping smoking and avoiding alcohol, caffeine, and chocolate.  You may be advised to keep your head raised (elevated) when sleeping. This reduces the chance of acid going backward from your stomach into your esophagus. Most of the time, nonspecific chest pain will improve within 2-3 days with rest and mild pain medicine.  HOME CARE INSTRUCTIONS   If antibiotics were prescribed, take them as directed. Finish them even if you start to feel better.  For the next few days, avoid physical activities that bring on chest pain. Continue physical activities as directed.  Do not use any tobacco products, including cigarettes,  chewing tobacco, or electronic cigarettes.  Avoid drinking alcohol.  Only take medicine as directed by your health care provider.  Follow your health care provider's suggestions for further testing if your chest pain does not go away.  Keep any follow-up appointments you made. If you do not go to an appointment, you could develop lasting (chronic) problems with pain. If there is any problem keeping an appointment, call to reschedule. SEEK MEDICAL CARE IF:   Your chest pain does not go away, even after treatment.  You have a rash with blisters on your chest.  You have a fever. SEEK IMMEDIATE MEDICAL CARE IF:   You have increased chest pain or pain that spreads to your arm, neck, jaw, back, or abdomen.  You have shortness of breath.  You have an increasing cough, or you cough up blood.  You have severe back or abdominal pain.  You feel nauseous or vomit.  You have severe weakness.  You faint.  You have chills. This is an emergency. Do not wait to see if the pain will go away. Get medical help at once. Call your local emergency services (911 in [place] Do not drive yourself to the hospital. MAKE SURE YOU:   Understand these instructions.  Will watch your condition.  Will get help right away if you are not doing well or get worse. Document Released: 01/10/2005 Document Revised: 04/07/2013 Document Reviewed: 11/06/2007 [facility] Patient Information 2015 [place], [place]. This information is not intended to replace advice given to you by your health care provider. Make sure you discuss any questions you have with your health care provider.

## 2015-06-11 NOTE — ED Provider Notes (Signed)
[facility] Emergency Department Provider Note  ____________________________________________  Time seen: Approximately 1:29 AM  I have reviewed the triage vital signs and the nursing notes.   HISTORY  Chief Complaint Chest Pain    HPI Tasha Myers is a 55 y.o. female with a past medical history that includes a prior MI about 16 years agowho presents with gradual onset midline substernal chest pain which radiates under her right breast.  She describes the pain as aching and sometimes sharp.  She reports that started about 16 hours ago after she got up in the morning and has been persistent.  Nothing makes it better and nothing makes it worse.  She also has a generally pain positive review of systems including mild shortness of breath, nausea, vomiting, mild generalized abdominal pain, subjective fevers," sometimes I feel hot if you like him sweating but my forehead is dry", fatigue for several days, and general malaise.  Of note, however, after my interview, she asked how much longer this was going to take because she is ready to go home.  Nothing is making her symptoms better and nothing is making them worse.  She does have an outpatient cardiologist that she cannot remember the name at this time.   Past Medical History  Diagnosis Date  . Type II diabetes mellitus ([place])   . Thoracic or lumbosacral neuritis or radiculitis, unspecified   . CAD S/P percutaneous coronary angioplasty     a. s/p BMS stent to RCA ~2000; b. PTCA of ISR in 2001; c. 09/2013 Cath: LM nl, LAD 49m, LCX min irregs, RI min irregs, RCA 20p ISR.  Marland Kitchen Insomnia   . Chronic combined systolic and diastolic CHF (congestive heart failure) ([place])     a. 09/2013 Echo: EF 45-50%, no rwma, Gr2 DD, mild AI, mildly dil LA.  Marland Kitchen HLD (hyperlipidemia)   . Essential hypertension   . COPD (chronic obstructive pulmonary disease) ([place])   . CKD (chronic kidney disease), stage III   . MI (myocardial infarction)  [facility])     Patient Active Problem List   Diagnosis Date Noted  . CKD (chronic kidney disease), stage III   . Essential hypertension   . Type II diabetes mellitus ([place])   . Acute on chronic systolic and diastolic heart failure, NYHA class 3 ([place]) 09/30/2013  . HTN (hypertension), malignant   . HLD (hyperlipidemia)   . Chronic combined systolic and diastolic CHF (congestive heart failure) ([place])   . CAD S/P percutaneous coronary angioplasty   . ACS (acute coronary syndrome) ([place]) 09/28/2013  . Malignant hypertension 09/28/2013  . HTN (hypertension) 10/25/2010  . CAD (coronary artery disease) 10/25/2010  . Hyperlipemia 10/25/2010  . Diabetes mellitus ([place]) 10/25/2010    Past Surgical History  Procedure Laterality Date  . Lymph node resection    . Coronary artery bypass graft    . Partial hysterectomy    . Carpal tunnel release      BILATERAL  . Left heart catheterization with coronary angiogram N/A 09/28/2013    Procedure: LEFT HEART CATHETERIZATION WITH CORONARY ANGIOGRAM;  Surgeon: Leonie Man, MD;  Location: [facility] CATH LAB;  Service: Cardiovascular;  Laterality: N/A;    Current Outpatient Rx  Name  Route  Sig  Dispense  Refill  . aspirin EC 81 MG tablet   Oral   Take 81 mg by mouth daily.          . carvedilol (COREG) 25 MG tablet   Oral   Take  25 mg by mouth 2 (two) times daily with a meal.           . clopidogrel (PLAVIX) 75 MG tablet   Oral   Take 75 mg by mouth daily.           . furosemide (LASIX) 40 MG tablet   Oral   Take 40 mg by mouth daily.         Marland Kitchen glipiZIDE (GLUCOTROL XL) 5 MG 24 hr tablet   Oral   Take 5 mg by mouth daily.         . isosorbide mononitrate (IMDUR) 30 MG 24 hr tablet   Oral   Take 30 mg by mouth daily.         Marland Kitchen lisinopril (PRINIVIL,ZESTRIL) 20 MG tablet   Oral   Take 1 tablet (20 mg total) by mouth daily.   30 tablet   11   . metFORMIN (GLUCOPHAGE) 1000 MG tablet   Oral   Take 1 tablet (1,000 mg total) by mouth  2 (two) times daily with a meal.   60 tablet   11   . nitroGLYCERIN (NITROSTAT) 0.4 MG SL tablet   Sublingual   Place 0.4 mg under the tongue every 5 (five) minutes as needed for chest pain.          . pravastatin (PRAVACHOL) 80 MG tablet   Oral   Take 1 tablet (80 mg total) by mouth at bedtime.   30 tablet   11   . pregabalin (LYRICA) 150 MG capsule   Oral   Take 150 mg by mouth 2 (two) times daily.           Allergies Review of patient's allergies indicates no known allergies.  Family History  Problem Relation Age of Onset  . Coronary artery disease Mother   . Hypertension Mother   . Diabetes Father   . Stroke Mother   . Stroke      sibling  . Coronary artery disease      sibling  . Diabetes      sibling  . Hypertension      sibling    Social History Social History  Substance Use Topics  . Smoking status: Current Every Day Smoker -- 1.00 packs/day for 40 years    Types: Cigarettes  . Smokeless tobacco: Never Used  . Alcohol Use: Yes     Comment: occasionally    Review of Systems Constitutional: Subjective fever/chills, general malaise, fatigue Eyes: No visual changes. ENT: No sore throat. Cardiovascular: Sternal chest pain, dull and aching, mild Respiratory: No shortness of breath with exertion Gastrointestinal: Generalized mild abdominal pain with nausea and some(.  No diarrhea.  No constipation. Genitourinary: Negative for dysuria. Musculoskeletal: Negative for back pain. Skin: Negative for rash. Neurological: Negative for headaches, focal weakness or numbness.  10-point ROS otherwise negative.  ____________________________________________   PHYSICAL EXAM:  VITAL SIGNS: ED Triage Vitals  Enc Vitals Group     BP 06/10/15 2338 175/74 mmHg     Pulse Rate 06/10/15 2338 91     Resp 06/10/15 2338 20     Temp 06/10/15 2338 98.7 F (37.1 C)     Temp Source 06/10/15 2338 Oral     SpO2 06/10/15 2338 95 %     Weight 06/10/15 2333 169 lb  (76.658 kg)     Height 06/10/15 2333 5\' 6"  (1.676 m)     Head Cir --      Peak Flow --  Pain Score 06/10/15 2333 9     Pain Loc --      Pain Edu? --      Excl. in [place]? --     Constitutional: Alert and oriented. Well appearing and in no acute distress. Eyes: Conjunctivae are normal. PERRL. EOMI. Head: Atraumatic. Nose: No congestion/rhinnorhea. Mouth/Throat: Mucous membranes are moist.  Oropharynx non-erythematous. Neck: No stridor.   Cardiovascular: Normal rate, regular rhythm. Grossly normal heart sounds.  Good peripheral circulation.  No reproducible chest wall tenderness Respiratory: Normal respiratory effort.  No retractions. Lungs CTAB. Gastrointestinal: Soft and nontender. No distention. No abdominal bruits. No CVA tenderness. Musculoskeletal: No lower extremity tenderness nor edema.  No joint effusions. Neurologic:  Normal speech and language. No gross focal neurologic deficits are appreciated.  Skin:  Skin is warm, dry and intact. No rash noted. Psychiatric: Mood and affect are normal. Speech and behavior are normal.  The patient is alert and oriented and has the capacity to make her own decisions.  ____________________________________________   LABS (all labs ordered are listed, but only abnormal results are displayed)  Labs Reviewed  BASIC METABOLIC PANEL - Abnormal; Notable for the following:    Chloride 99 (*)    Glucose, Bld 154 (*)    Creatinine, Ser 1.23 (*)    GFR calc non Af Amer 48 (*)    GFR calc Af Amer 56 (*)    All other components within normal limits  CBC - Abnormal; Notable for the following:    RDW 15.5 (*)    All other components within normal limits  TROPONIN I - Abnormal; Notable for the following:    Troponin I 0.04 (*)    All other components within normal limits  TROPONIN I   ____________________________________________  EKG  ED ECG REPORT I, Devyn Griffing, the attending physician, personally viewed and interpreted this ECG.    Date: 06/10/2015  EKG Time: 23:35  Rate: 91  Rhythm: normal sinus rhythm  Axis: Left axis deviation  Intervals:left bundle branch block  ST&T Change: Non-specific ST segment / T-wave changes, but no evidence of acute ischemia.  T-wave inversions in leads V5 and V6 and aVL.  Overall the morphology of the EKG is quite similar to that obtained about 3 months ago.  ____________________________________________  RADIOLOGY   Dg Chest 2 View  06/11/2015  CLINICAL DATA:  Mid chest pain radiating to the right breast since yesterday morning. Shortness of breath, vomiting and nausea. EXAM: CHEST  2 VIEW COMPARISON:  01/11/2014 radiograph, CT 02/04/2015 FINDINGS: Fine reticular opacity is corresponding to emphysema on prior CT. No definite superimposed edema. Central bronchial thickening. No confluent airspace disease. Cardiomediastinal contours are unchanged. No pleural effusion or pneumothorax. Osseous structures are stable. IMPRESSION: Emphysema and central bronchial thickening. No evidence of superimposed acute process go Electronically Signed   By: Jeb Levering M.D.   On: 06/11/2015 01:35    ____________________________________________   PROCEDURES  Procedure(s) performed: None  Critical Care performed: No ____________________________________________   INITIAL IMPRESSION / ASSESSMENT AND PLAN / ED COURSE  Pertinent labs & imaging results that were available during my care of the patient were reviewed by me and considered in my medical decision making (see chart for details).  I first saw the patient and explained to her that with Friday of symptoms she has an history that we may need to keep her in the hospital.  As described above in the history of present illness, she immediately told me that she is ready  to go home and that she is not staying.  Explained to the patient that her troponin is 0.04 and that we need to repeat her troponin at a minimum to make sure she is not having a  potentially life-threatening or permanently disabling conditions such as a heart attack, and she started taking off all of her leads in saying that she is ready to go home.  I offered her morphine and Zofran to help with her discomfort as well as other treatment such as a GI cocktail for her burning epigastric pain, and she is refusing everything.  She is calm, alert, and in my opinion has the capacity to make her decision.  She states that she has been here for an hour and a half and that we have not done anything so she is ready to go.  I reminded her everything that we have done and that I am willing to do but she is refusing.  She is not angry or inappropriate, she is just ready to go.  I asked the nurse to have her sign the AMA forms.  She knows that she can return at any time.    ____________________________________________  FINAL CLINICAL IMPRESSION(S) / ED DIAGNOSES  Final diagnoses:  Atypical chest pain      NEW MEDICATIONS STARTED DURING THIS VISIT:  New Prescriptions   No medications on file      Please note that this document was prepared using voice recognition software and may include unintentional dictation errors.   Hinda Kehr, MD 06/11/15 [phone]

## 2015-06-11 NOTE — ED Notes (Signed)
MD at bedside. 

## 2015-06-11 NOTE — ED Notes (Addendum)
MD at bedside. Pt continues to state that she wants to leave. MD made pt aware of risks of leaving. Pt alert and oriented X4, active, cooperative, pt in NAD. RR even and unlabored, color WNL.

## 2015-06-11 NOTE — ED Notes (Signed)
Pt alert and oriented X4, active, cooperative, pt in NAD. RR even and unlabored, color WNL.  Pt informed to return if any life threatening symptoms occur.  Ambulatory.

## 2015-06-11 NOTE — ED Notes (Addendum)
Pt states that she does not want IV and does not want to stay for repeat troponin to be drawn. Education done regarding importance of repeat troponin and why serial troponins are collected and tested. Pt continue to refuse.

## 2015-06-11 NOTE — ED Notes (Signed)
Troponin 0.04 verbal to Dr Karma Greaser.

## 2015-11-23 ENCOUNTER — Other Ambulatory Visit: Payer: Self-pay | Admitting: Family Medicine

## 2015-11-23 DIAGNOSIS — Z1231 Encounter for screening mammogram for malignant neoplasm of breast: Secondary | ICD-10-CM

## 2015-11-28 ENCOUNTER — Inpatient Hospital Stay
Admission: RE | Admit: 2015-11-28 | Discharge: 2015-11-28 | Disposition: A | Discharge status: Home / Self Care | Payer: Self-pay | Source: Ambulatory Visit | Attending: *Deleted | Admitting: *Deleted

## 2015-11-28 ENCOUNTER — Other Ambulatory Visit: Payer: Self-pay | Admitting: *Deleted

## 2015-11-28 DIAGNOSIS — Z9289 Personal history of other medical treatment: Secondary | ICD-10-CM

## 2015-12-14 ENCOUNTER — Other Ambulatory Visit: Payer: Self-pay | Admitting: Family Medicine

## 2015-12-14 ENCOUNTER — Ambulatory Visit
Admission: RE | Admit: 2015-12-14 | Discharge: 2015-12-14 | Disposition: A | Discharge status: Home / Self Care | Payer: Medicare PPO | Source: Ambulatory Visit | Attending: Family Medicine | Admitting: Family Medicine

## 2015-12-14 DIAGNOSIS — Z1231 Encounter for screening mammogram for malignant neoplasm of breast: Secondary | ICD-10-CM

## 2016-09-20 ENCOUNTER — Emergency Department (HOSPITAL_COMMUNITY)
Admission: EM | Admit: 2016-09-20 | Discharge: 2016-09-20 | Disposition: A | Discharge status: Home / Self Care | Payer: Medicare PPO | Attending: Emergency Medicine | Admitting: Emergency Medicine

## 2016-09-20 ENCOUNTER — Encounter (HOSPITAL_COMMUNITY): Payer: Self-pay | Admitting: Emergency Medicine

## 2016-09-20 DIAGNOSIS — N3091 Cystitis, unspecified with hematuria: Secondary | ICD-10-CM | POA: Insufficient documentation

## 2016-09-20 DIAGNOSIS — I5042 Chronic combined systolic (congestive) and diastolic (congestive) heart failure: Secondary | ICD-10-CM | POA: Diagnosis not present

## 2016-09-20 DIAGNOSIS — Z7902 Long term (current) use of antithrombotics/antiplatelets: Secondary | ICD-10-CM | POA: Diagnosis not present

## 2016-09-20 DIAGNOSIS — E119 Type 2 diabetes mellitus without complications: Secondary | ICD-10-CM | POA: Insufficient documentation

## 2016-09-20 DIAGNOSIS — I252 Old myocardial infarction: Secondary | ICD-10-CM | POA: Diagnosis not present

## 2016-09-20 DIAGNOSIS — N183 Chronic kidney disease, stage 3 (moderate): Secondary | ICD-10-CM | POA: Insufficient documentation

## 2016-09-20 DIAGNOSIS — Z79899 Other long term (current) drug therapy: Secondary | ICD-10-CM | POA: Insufficient documentation

## 2016-09-20 DIAGNOSIS — R319 Hematuria, unspecified: Secondary | ICD-10-CM | POA: Diagnosis present

## 2016-09-20 DIAGNOSIS — I130 Hypertensive heart and chronic kidney disease with heart failure and stage 1 through stage 4 chronic kidney disease, or unspecified chronic kidney disease: Secondary | ICD-10-CM | POA: Insufficient documentation

## 2016-09-20 DIAGNOSIS — Z7984 Long term (current) use of oral hypoglycemic drugs: Secondary | ICD-10-CM | POA: Diagnosis not present

## 2016-09-20 DIAGNOSIS — J449 Chronic obstructive pulmonary disease, unspecified: Secondary | ICD-10-CM | POA: Diagnosis not present

## 2016-09-20 DIAGNOSIS — Z7982 Long term (current) use of aspirin: Secondary | ICD-10-CM | POA: Insufficient documentation

## 2016-09-20 DIAGNOSIS — I2510 Atherosclerotic heart disease of native coronary artery without angina pectoris: Secondary | ICD-10-CM | POA: Diagnosis not present

## 2016-09-20 DIAGNOSIS — F17210 Nicotine dependence, cigarettes, uncomplicated: Secondary | ICD-10-CM | POA: Diagnosis not present

## 2016-09-20 LAB — URINALYSIS, ROUTINE W REFLEX MICROSCOPIC
BACTERIA UA: NONE SEEN
BILIRUBIN URINE: NEGATIVE
Glucose, UA: NEGATIVE mg/dL
Ketones, ur: NEGATIVE mg/dL
NITRITE: NEGATIVE
PH: 5 (ref 5.0–8.0)
Protein, ur: 100 mg/dL — AB
SPECIFIC GRAVITY, URINE: 1.014 (ref 1.005–1.030)

## 2016-09-20 MED ORDER — CEPHALEXIN 500 MG PO CAPS: 500.0000 mg | ORAL_CAPSULE | Freq: Two times a day (BID) | ORAL | 0 refills | Status: DC

## 2016-09-20 MED ORDER — PHENAZOPYRIDINE HCL 200 MG PO TABS: 200.0000 mg | ORAL_TABLET | Freq: Three times a day (TID) | ORAL | 0 refills | Status: DC | PRN

## 2016-09-20 MED ORDER — CEPHALEXIN 250 MG PO CAPS
500.0000 mg | ORAL_CAPSULE | Freq: Once | ORAL | Status: AC
  Administered 2016-09-20: 500 mg via ORAL
  Filled 2016-09-20: qty 2

## 2016-09-20 NOTE — ED Notes (Signed)
Pt up to desk stating she is going out to the car, provided answers to questions and apologized for wait.

## 2016-09-20 NOTE — ED Notes (Signed)
Pt returned to lobby.   

## 2016-09-20 NOTE — ED Triage Notes (Signed)
Pt to ED from home c/o sudden onset hematuria and urgency this evening. Pt states she went to the bathroom and after wiping, saw some pink-tinge on tissue. Pt denies burning or pain with urination, but does endorse lower abdominal pressure and feeling like she has to go more frequently. Pt denies fevers/chills.

## 2016-09-20 NOTE — ED Provider Notes (Signed)
[facility] DEPT Provider Note   CSN: [id number] Arrival date & time: 09/20/16  0143     History   Chief Complaint Chief Complaint  Patient presents with  . Hematuria    HPI Tasha Myers is a 56 y.o. female.  Patient presents to the emergency department for evaluation of hematuria. Patient reports that she urinated earlier today and noticed some blood on the tissue after she wiped. Later, she then urinated and noticed gross blood in the urine. She has been experiencing some pain in the area of the bladder, has not noticed any urinary frequency. She has not had fever, nausea or vomiting.      Past Medical History:  Diagnosis Date  . CAD S/P percutaneous coronary angioplasty    a. s/p BMS stent to RCA ~2000; b. PTCA of ISR in 2001; c. 09/2013 Cath: LM nl, LAD 81m, LCX min irregs, RI min irregs, RCA 20p ISR.  Marland Kitchen Chronic combined systolic and diastolic CHF (congestive heart failure) ([place])    a. 09/2013 Echo: EF 45-50%, no rwma, Gr2 DD, mild AI, mildly dil LA.  . CKD (chronic kidney disease), stage III   . COPD (chronic obstructive pulmonary disease) ([place])   . Essential hypertension   . HLD (hyperlipidemia)   . Insomnia   . MI (myocardial infarction) ([place])   . Thoracic or lumbosacral neuritis or radiculitis, unspecified   . Type II diabetes mellitus [facility])     Patient Active Problem List   Diagnosis Date Noted  . CKD (chronic kidney disease), stage III   . Essential hypertension   . Type II diabetes mellitus ([place])   . Acute on chronic systolic and diastolic heart failure, NYHA class 3 ([place]) 09/30/2013  . HTN (hypertension), malignant   . HLD (hyperlipidemia)   . Chronic combined systolic and diastolic CHF (congestive heart failure) ([place])   . CAD S/P percutaneous coronary angioplasty   . ACS (acute coronary syndrome) ([place]) 09/28/2013  . Malignant hypertension 09/28/2013  . HTN (hypertension) 10/25/2010  . CAD (coronary artery disease) 10/25/2010  . Hyperlipemia  10/25/2010  . Diabetes mellitus ([place]) 10/25/2010    Past Surgical History:  Procedure Laterality Date  . BREAST BIOPSY Right 2014   neg x 3 areas  . BREAST EXCISIONAL BIOPSY Left YRS AGO   NEG  . CARPAL TUNNEL RELEASE     BILATERAL  . CORONARY ARTERY BYPASS GRAFT    . LEFT HEART CATHETERIZATION WITH CORONARY ANGIOGRAM N/A 09/28/2013   Procedure: LEFT HEART CATHETERIZATION WITH CORONARY ANGIOGRAM;  Surgeon: Leonie Man, MD;  Location: [facility] CATH LAB;  Service: Cardiovascular;  Laterality: N/A;  . LYMPH NODE RESECTION    . PARTIAL HYSTERECTOMY      OB History    No data available       Home Medications    Prior to Admission medications   Medication Sig Start Date End Date Taking? Authorizing Provider  aspirin EC 81 MG tablet Take 81 mg by mouth daily.     [provider]  carvedilol (COREG) 25 MG tablet Take 25 mg by mouth 2 (two) times daily with a meal.      [provider]  cephALEXin (KEFLEX) 500 MG capsule Take 1 capsule (500 mg total) by mouth 2 (two) times daily. 09/20/16   Orpah Greek, MD  clopidogrel (PLAVIX) 75 MG tablet Take 75 mg by mouth daily.      [provider]  furosemide (LASIX) 40 MG tablet Take 40 mg by  mouth daily.    [provider]  glipiZIDE (GLUCOTROL XL) 5 MG 24 hr tablet Take 5 mg by mouth daily.    [provider]  isosorbide mononitrate (IMDUR) 30 MG 24 hr tablet Take 30 mg by mouth daily.    [provider]  lisinopril (PRINIVIL,ZESTRIL) 20 MG tablet Take 1 tablet (20 mg total) by mouth daily. 09/30/13   Eileen Stanford, PA-C  metFORMIN (GLUCOPHAGE) 1000 MG tablet Take 1 tablet (1,000 mg total) by mouth 2 (two) times daily with a meal. 10/01/13   Eileen Stanford, PA-C  nitroGLYCERIN (NITROSTAT) 0.4 MG SL tablet Place 0.4 mg under the tongue every 5 (five) minutes as needed for chest pain.     [provider]  phenazopyridine (PYRIDIUM) 200 MG tablet Take 1 tablet (200 mg  total) by mouth 3 (three) times daily as needed for pain. 09/20/16   Orpah Greek, MD  pravastatin (PRAVACHOL) 80 MG tablet Take 1 tablet (80 mg total) by mouth at bedtime. 09/30/13   Eileen Stanford, PA-C  pregabalin (LYRICA) 150 MG capsule Take 150 mg by mouth 2 (two) times daily.    [provider]    Family History Family History  Problem Relation Age of Onset  . Coronary artery disease Mother   . Hypertension Mother   . Stroke Mother   . Diabetes Father   . Stroke Unknown        sibling  . Coronary artery disease Unknown        sibling  . Diabetes Unknown        sibling  . Hypertension Unknown        sibling  . Breast cancer Neg Hx     Social History Social History  Substance Use Topics  . Smoking status: Current Every Day Smoker    Packs/day: 1.00    Years: 40.00    Types: Cigarettes  . Smokeless tobacco: Never Used  . Alcohol use Yes     Comment: occasionally     Allergies   Patient has no known allergies.   Review of Systems Review of Systems  Genitourinary: Positive for hematuria.  All other systems reviewed and are negative.    Physical Exam Updated Vital Signs BP 140/60 (BP Location: Left Arm)   Pulse 88   Temp 97.9 F (36.6 C) (Axillary)   Resp 16   Ht 5\' 6"  (1.676 m)   Wt 79.6 kg (175 lb 8 oz)   SpO2 100%   BMI 28.33 kg/m   Physical Exam  Constitutional: She is oriented to person, place, and time. She appears well-developed and well-nourished. No distress.  HENT:  Head: Normocephalic and atraumatic.  Right Ear: Hearing normal.  Left Ear: Hearing normal.  Nose: Nose normal.  Mouth/Throat: Oropharynx is clear and moist and mucous membranes are normal.  Eyes: Conjunctivae and EOM are normal. Pupils are equal, round, and reactive to light.  Neck: Normal range of motion. Neck supple.  Cardiovascular: Regular rhythm, S1 normal and S2 normal.  Exam reveals no gallop and no friction rub.   No murmur  heard. Pulmonary/Chest: Effort normal and breath sounds normal. No respiratory distress. She exhibits no tenderness.  Abdominal: Soft. Normal appearance and bowel sounds are normal. There is no hepatosplenomegaly. There is tenderness in the suprapubic area. There is no rebound, no guarding, no tenderness at McBurney's point and negative Murphy's sign. No hernia.  Musculoskeletal: Normal range of motion.  Neurological: She is alert and oriented  to person, place, and time. She has normal strength. No cranial nerve deficit or sensory deficit. Coordination normal. GCS eye subscore is 4. GCS verbal subscore is 5. GCS motor subscore is 6.  Skin: Skin is warm, dry and intact. No rash noted. No cyanosis.  Psychiatric: She has a normal mood and affect. Her speech is normal and behavior is normal. Thought content normal.  Nursing note and vitals reviewed.    ED Treatments / Results  Labs (all labs ordered are listed, but only abnormal results are displayed) Labs Reviewed  URINALYSIS, ROUTINE W REFLEX MICROSCOPIC - Abnormal; Notable for the following:       Result Value   Color, Urine AMBER (*)    APPearance TURBID (*)    Hgb urine dipstick LARGE (*)    Protein, ur 100 (*)    Leukocytes, UA LARGE (*)    Squamous Epithelial / LPF 0-5 (*)    All other components within normal limits    EKG  EKG Interpretation None       Radiology No results found.  Procedures Procedures (including critical care time)  Medications Ordered in ED Medications - No data to display   Initial Impression / Assessment and Plan / ED Course  I have reviewed the triage vital signs and the nursing notes.  Pertinent labs & imaging results that were available during my care of the patient were reviewed by me and considered in my medical decision making (see chart for details).    Patient presents to the ER for evaluation of suprapubic discomfort and hematuria. Urinalysis shows obvious infection. Patient is  afebrile, appears well. No sign of sepsis or systemic illness. Will treat with Keflex.  Final Clinical Impressions(s) / ED Diagnoses   Final diagnoses:  Hemorrhagic cystitis    New Prescriptions New Prescriptions   CEPHALEXIN (KEFLEX) 500 MG CAPSULE    Take 1 capsule (500 mg total) by mouth 2 (two) times daily.   PHENAZOPYRIDINE (PYRIDIUM) 200 MG TABLET    Take 1 tablet (200 mg total) by mouth 3 (three) times daily as needed for pain.     Orpah Greek, MD 09/20/16 [phone]

## 2016-10-15 ENCOUNTER — Other Ambulatory Visit: Payer: Self-pay | Admitting: Family Medicine

## 2016-10-15 DIAGNOSIS — Z1231 Encounter for screening mammogram for malignant neoplasm of breast: Secondary | ICD-10-CM

## 2017-07-03 ENCOUNTER — Telehealth: Payer: Self-pay | Admitting: Cardiovascular Disease

## 2017-07-03 NOTE — Telephone Encounter (Signed)
°*  STAT* If patient is at the pharmacy, call can be transferred to refill team.   1. Which medications need to be refilled? (please list name of each medication and dose if known) clopidogrel (PLAVIX) 75 MG  2. Which pharmacy/location (including street and city if local pharmacy) is medication to be sent to? [facility] in [place]  3. Do they need a 30 day or 90 day supply? 90 day
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
## 2017-07-04 ENCOUNTER — Other Ambulatory Visit: Payer: Self-pay | Admitting: *Deleted

## 2017-07-04 MED ORDER — CLOPIDOGREL BISULFATE 75 MG PO TABS: 75.0000 mg | ORAL_TABLET | Freq: Every day | ORAL | 0 refills | Status: DC

## 2017-07-04 NOTE — Telephone Encounter (Signed)
Requested Prescriptions   Signed Prescriptions Disp Refills   clopidogrel (PLAVIX) 75 MG tablet 30 tablet 0    Sig: Take 1 tablet (75 mg total) by mouth daily.    Authorizing Provider: GOLLAN, TIMOTHY J    Ordering User: LOPEZ, MARINA C    

## 2017-07-04 NOTE — Telephone Encounter (Signed)
Requested Prescriptions   Signed Prescriptions Disp Refills  . clopidogrel (PLAVIX) 75 MG tablet 30 tablet 0    Sig: Take 1 tablet (75 mg total) by mouth daily.    Authorizing Provider: Minna Merritts    Ordering User: Othelia Pulling C   Pt has not been seen since 2016.  Refill has been sent to local pharmacy until pt is able to make office visit.

## 2017-07-05 ENCOUNTER — Encounter: Payer: Self-pay | Admitting: Cardiovascular Disease

## 2017-07-05 ENCOUNTER — Ambulatory Visit (INDEPENDENT_AMBULATORY_CARE_PROVIDER_SITE_OTHER): Payer: Medicare HMO | Admitting: Cardiovascular Disease

## 2017-07-05 VITALS — BP 128/70 | HR 99 | Ht 66.0 in | Wt 178.2 lb

## 2017-07-05 DIAGNOSIS — N183 Chronic kidney disease, stage 3 unspecified: Secondary | ICD-10-CM

## 2017-07-05 DIAGNOSIS — I10 Essential (primary) hypertension: Secondary | ICD-10-CM | POA: Diagnosis not present

## 2017-07-05 DIAGNOSIS — E1122 Type 2 diabetes mellitus with diabetic chronic kidney disease: Secondary | ICD-10-CM

## 2017-07-05 DIAGNOSIS — I25118 Atherosclerotic heart disease of native coronary artery with other forms of angina pectoris: Secondary | ICD-10-CM | POA: Diagnosis not present

## 2017-07-05 DIAGNOSIS — I5043 Acute on chronic combined systolic (congestive) and diastolic (congestive) heart failure: Secondary | ICD-10-CM | POA: Diagnosis not present

## 2017-07-05 NOTE — Patient Instructions (Signed)

## 2017-07-09 IMAGING — CT CT CHEST W/O CM
1 series · 15 of 31 positions shown, 19 images · non-contrast
Comparison: CT scan of November 30, 2009 and October 28, 2014.

CLINICAL DATA: Pulmonary nodules.

EXAM:
CT CHEST WITHOUT CONTRAST
TECHNIQUE: Multidetector CT imaging of the chest was performed following the
standard protocol without IV contrast.

[Series 2: routine chest wo · axial · 0.63mm/px · z∈[-290,-36]mm · 15 of 57 slices shown, 19 images]
[im 3/57  mediastinal]
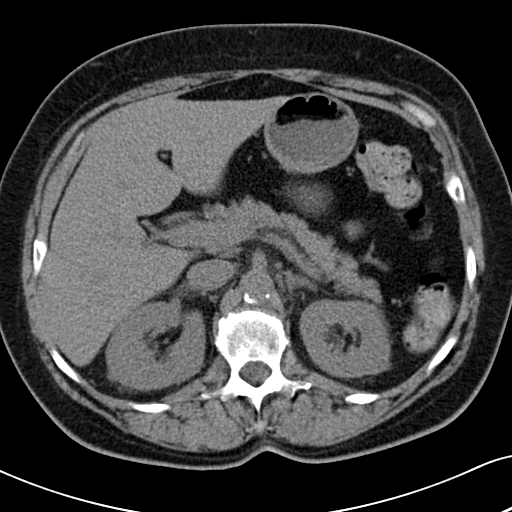
[im 3/57  lung]
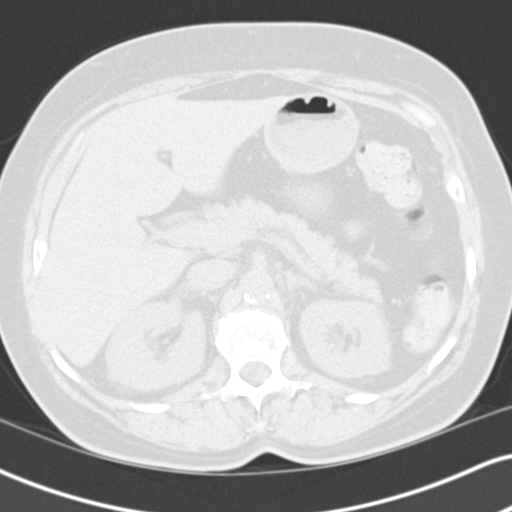
[im 7/57  lung]
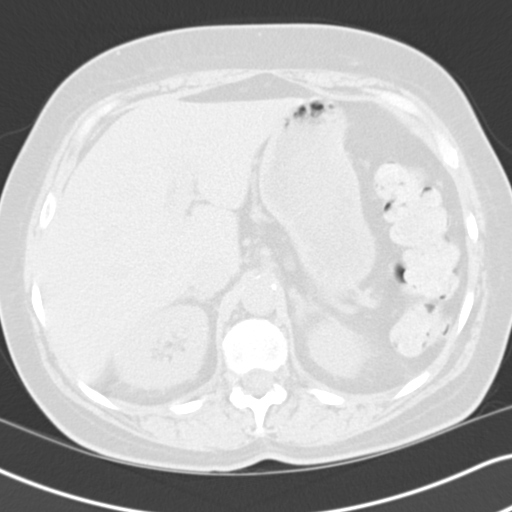
[im 11/57  lung]
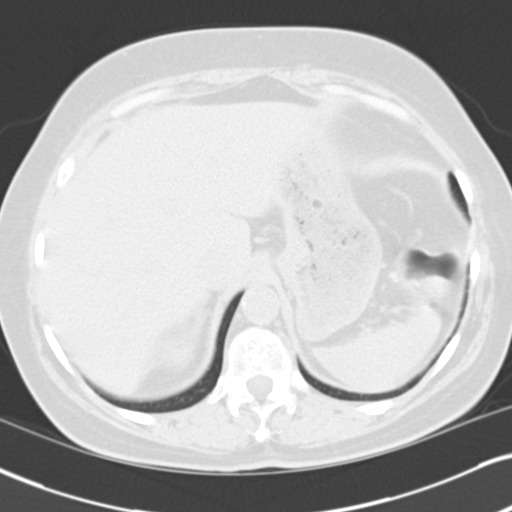
[im 13/57  lung]
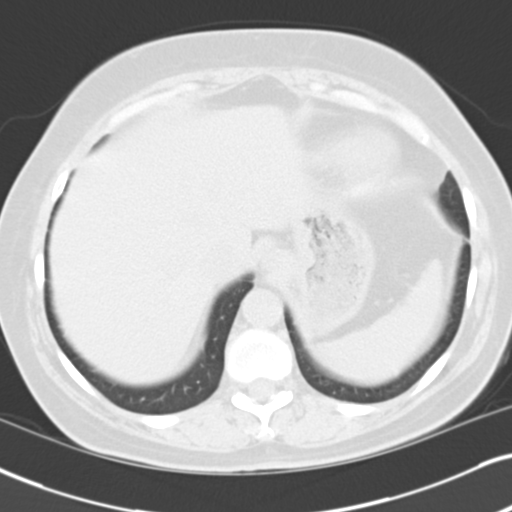
[im 17/57  mediastinal]
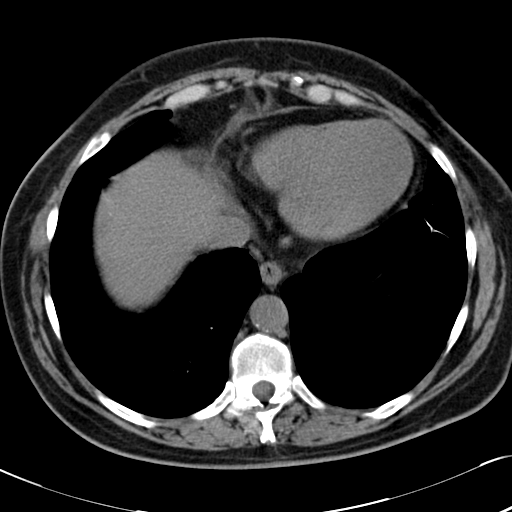
[im 17/57  lung]
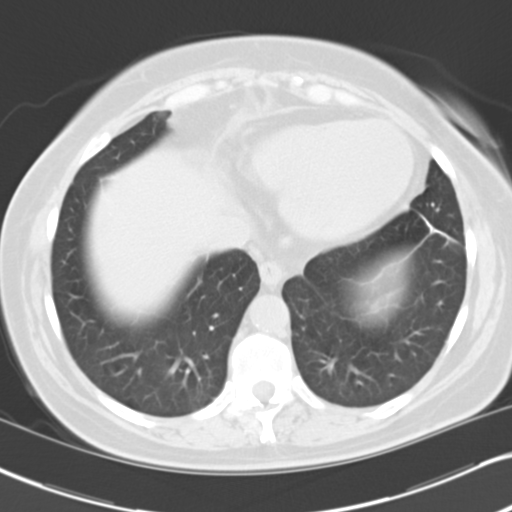
[im 21/57  lung]
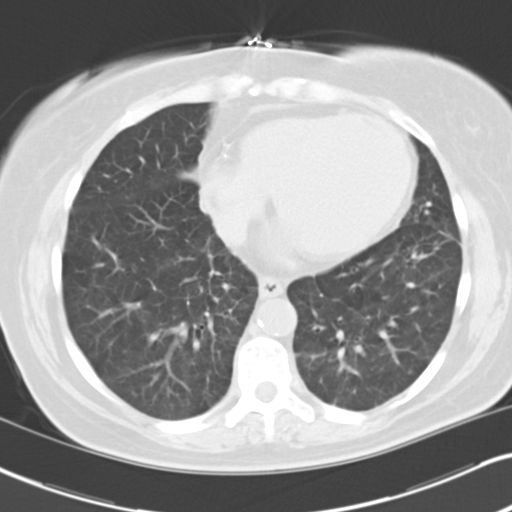
[im 25/57  lung]
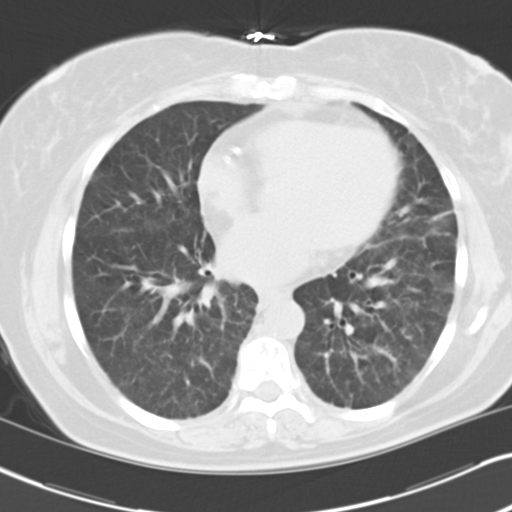
[im 30/57  lung]
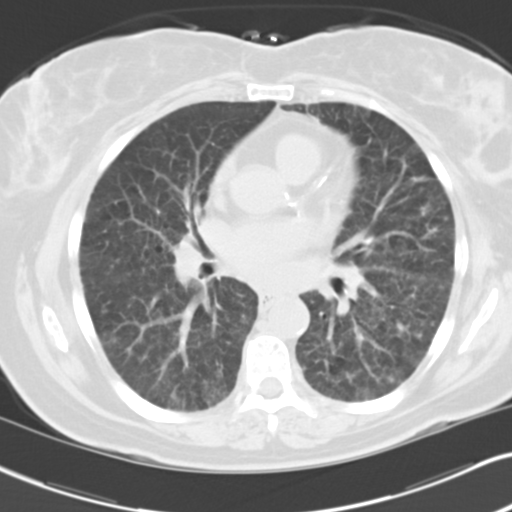
[im 32/57  mediastinal]
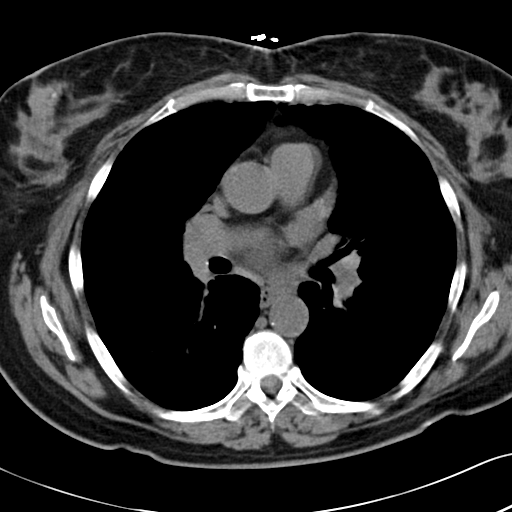
[im 32/57  lung]
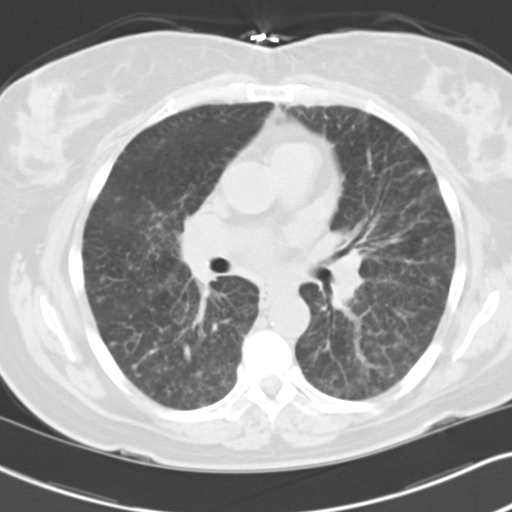
[im 36/57  lung]
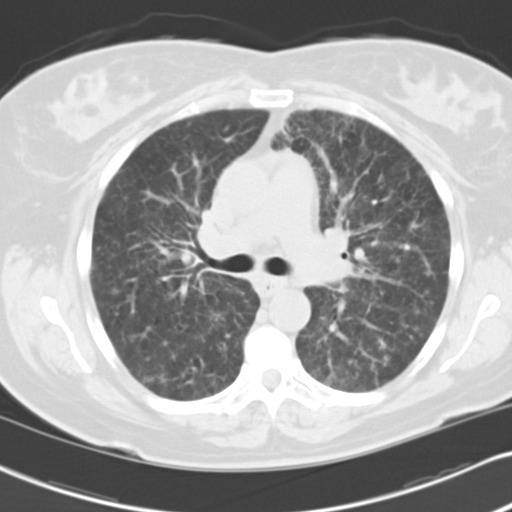
[im 40/57  lung]
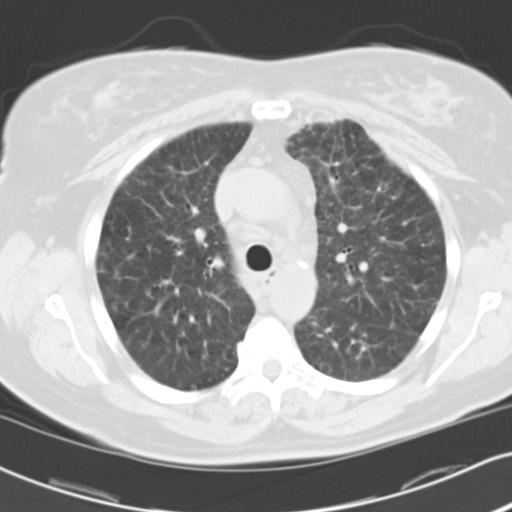
[im 44/57  lung]
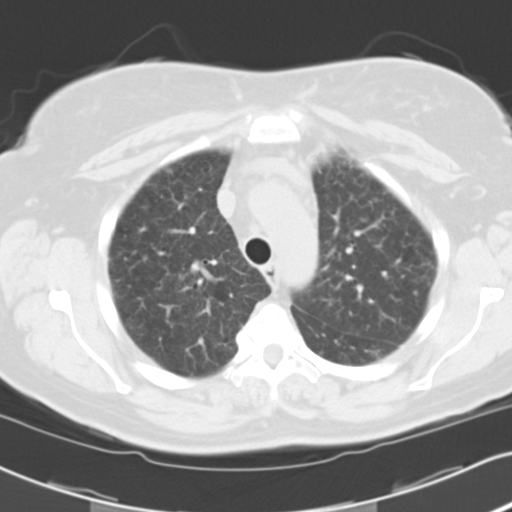
[im 46/57  mediastinal]
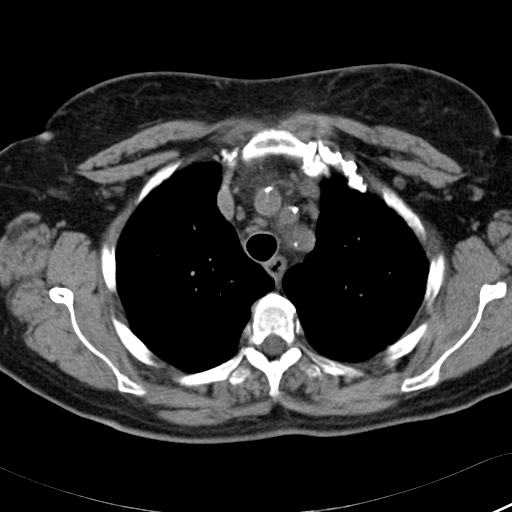
[im 46/57  lung]
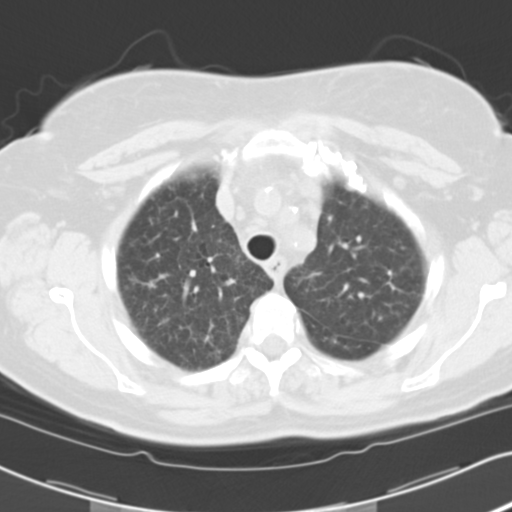
[im 50/57  lung]
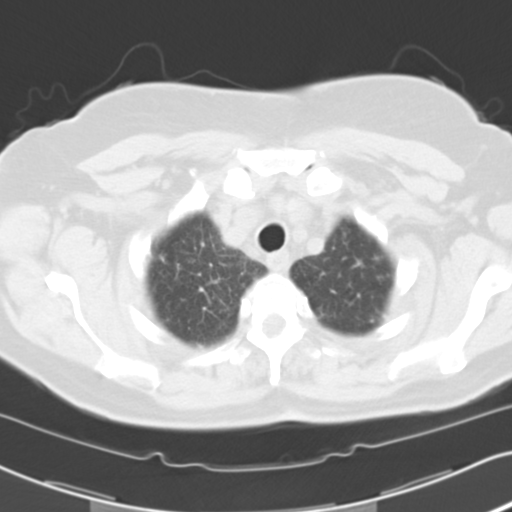
[im 54/57  lung]
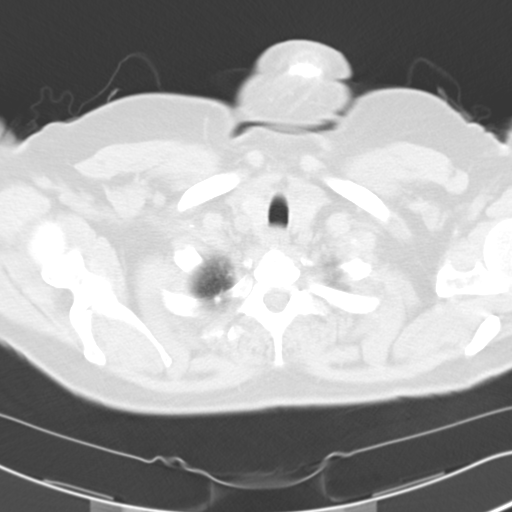

[15 of 31 positions shown; findings below may reference images not displayed]

FINDINGS: No pneumothorax or pleural effusion is noted. 6 mm nodule is noted
in right lower lobe on image number 36 of series 3 which is stable
compared to prior exam of [DATE] mm nodule is noted posteriorly in
right lower lobe best seen on image number 31 of series 3 which is
stable since 6011 as well. Multiple other smaller nodules are noted
posteriorly in the right lower lobe, including 5 mm nodule
posteriorly in right lower lobe best seen on image no twenty-six of
series 3. This also appears to be unchanged. 4.4 mm nodule is noted
laterally in left lower lobe on image number 30 of series 3 which is
not clearly identified on prior exams. Stable interstitial densities
are noted in the perihilar regions most consistent with scarring.
Some degree of bronchiectasis appears to be in both lower lobes.

Mildly enlarged lymph nodes are noted in the mediastinum which
overall appear to be smaller compared to prior exam, and therefore
most likely are inflammatory or reactive in etiology. There is no
evidence of thoracic aortic aneurysm. Coronary artery calcifications
are noted suggesting coronary artery disease. Visualized portion of
upper abdomen is unremarkable. No significant osseous abnormality is
noted.
IMPRESSION: Coronary artery calcifications are noted suggesting coronary artery
disease.

Mildly enlarged mediastinal lymph nodes are noted which are smaller
compared to prior exam and therefore most likely are reactive or
benign in etiology.

Several right-sided pulmonary nodules are noted, all of which are
stable compared to prior exam of 6011, and therefore can be
considered benign at this point.

New 4.4 mm nodule is noted in left lower lobe. If the patient is at
high risk for bronchogenic carcinoma, follow-up chest CT at 1 year
is recommended. If the patient is at low risk, no follow-up is
needed. This recommendation follows the consensus statement:
Guidelines for Management of Small Pulmonary Nodules Detected on CT
Scans: A Statement from the [HOSPITAL] as published in

## 2017-07-10 ENCOUNTER — Other Ambulatory Visit: Payer: Self-pay | Admitting: Cardiovascular Disease

## 2017-07-15 ENCOUNTER — Ambulatory Visit: Payer: Medicare PPO | Admitting: Cardiovascular Disease

## 2018-06-05 DIAGNOSIS — J849 Interstitial pulmonary disease, unspecified: Secondary | ICD-10-CM | POA: Insufficient documentation

## 2018-09-10 ENCOUNTER — Encounter (HOSPITAL_COMMUNITY): Payer: Self-pay | Admitting: Emergency Medicine

## 2018-09-10 ENCOUNTER — Inpatient Hospital Stay (HOSPITAL_COMMUNITY): Payer: Medicare Other

## 2018-09-10 ENCOUNTER — Emergency Department (HOSPITAL_COMMUNITY): Payer: Medicare Other

## 2018-09-10 ENCOUNTER — Inpatient Hospital Stay (HOSPITAL_COMMUNITY): Payer: Medicare Other | Admitting: Certified Registered"

## 2018-09-10 ENCOUNTER — Encounter (HOSPITAL_COMMUNITY)
Admission: EM | Disposition: A | Discharge status: Home / Self Care | Payer: Self-pay | Source: Home / Self Care | Attending: Student in an Organized Health Care Education/Training Program

## 2018-09-10 ENCOUNTER — Inpatient Hospital Stay (HOSPITAL_COMMUNITY)
Admission: EM | Admit: 2018-09-10 | Discharge: 2018-09-11 | DRG: 481 | Disposition: A | Discharge status: Home / Self Care | Payer: Medicare Other | Attending: Student in an Organized Health Care Education/Training Program | Admitting: Student in an Organized Health Care Education/Training Program

## 2018-09-10 ENCOUNTER — Other Ambulatory Visit: Payer: Self-pay

## 2018-09-10 DIAGNOSIS — I447 Left bundle-branch block, unspecified: Secondary | ICD-10-CM | POA: Diagnosis not present

## 2018-09-10 DIAGNOSIS — W19XXXA Unspecified fall, initial encounter: Secondary | ICD-10-CM | POA: Diagnosis not present

## 2018-09-10 DIAGNOSIS — N183 Chronic kidney disease, stage 3 unspecified: Secondary | ICD-10-CM | POA: Diagnosis present

## 2018-09-10 DIAGNOSIS — I2510 Atherosclerotic heart disease of native coronary artery without angina pectoris: Secondary | ICD-10-CM | POA: Diagnosis present

## 2018-09-10 DIAGNOSIS — Z7984 Long term (current) use of oral hypoglycemic drugs: Secondary | ICD-10-CM | POA: Diagnosis not present

## 2018-09-10 DIAGNOSIS — Z8249 Family history of ischemic heart disease and other diseases of the circulatory system: Secondary | ICD-10-CM | POA: Diagnosis not present

## 2018-09-10 DIAGNOSIS — R011 Cardiac murmur, unspecified: Secondary | ICD-10-CM

## 2018-09-10 DIAGNOSIS — J849 Interstitial pulmonary disease, unspecified: Secondary | ICD-10-CM | POA: Diagnosis not present

## 2018-09-10 DIAGNOSIS — Z833 Family history of diabetes mellitus: Secondary | ICD-10-CM | POA: Diagnosis not present

## 2018-09-10 DIAGNOSIS — Z7902 Long term (current) use of antithrombotics/antiplatelets: Secondary | ICD-10-CM | POA: Diagnosis not present

## 2018-09-10 DIAGNOSIS — M80051A Age-related osteoporosis with current pathological fracture, right femur, initial encounter for fracture: Principal | ICD-10-CM | POA: Diagnosis present

## 2018-09-10 DIAGNOSIS — E119 Type 2 diabetes mellitus without complications: Secondary | ICD-10-CM

## 2018-09-10 DIAGNOSIS — G8929 Other chronic pain: Secondary | ICD-10-CM

## 2018-09-10 DIAGNOSIS — E1140 Type 2 diabetes mellitus with diabetic neuropathy, unspecified: Secondary | ICD-10-CM

## 2018-09-10 DIAGNOSIS — F17210 Nicotine dependence, cigarettes, uncomplicated: Secondary | ICD-10-CM

## 2018-09-10 DIAGNOSIS — K219 Gastro-esophageal reflux disease without esophagitis: Secondary | ICD-10-CM

## 2018-09-10 DIAGNOSIS — Z20828 Contact with and (suspected) exposure to other viral communicable diseases: Secondary | ICD-10-CM | POA: Diagnosis present

## 2018-09-10 DIAGNOSIS — Z955 Presence of coronary angioplasty implant and graft: Secondary | ICD-10-CM | POA: Diagnosis not present

## 2018-09-10 DIAGNOSIS — M549 Dorsalgia, unspecified: Secondary | ICD-10-CM | POA: Diagnosis not present

## 2018-09-10 DIAGNOSIS — R590 Localized enlarged lymph nodes: Secondary | ICD-10-CM | POA: Insufficient documentation

## 2018-09-10 DIAGNOSIS — Z823 Family history of stroke: Secondary | ICD-10-CM | POA: Diagnosis not present

## 2018-09-10 DIAGNOSIS — E785 Hyperlipidemia, unspecified: Secondary | ICD-10-CM | POA: Diagnosis present

## 2018-09-10 DIAGNOSIS — Z419 Encounter for procedure for purposes other than remedying health state, unspecified: Secondary | ICD-10-CM

## 2018-09-10 DIAGNOSIS — I252 Old myocardial infarction: Secondary | ICD-10-CM | POA: Diagnosis not present

## 2018-09-10 DIAGNOSIS — S72101A Unspecified trochanteric fracture of right femur, initial encounter for closed fracture: Secondary | ICD-10-CM

## 2018-09-10 DIAGNOSIS — S72009A Fracture of unspecified part of neck of unspecified femur, initial encounter for closed fracture: Secondary | ICD-10-CM

## 2018-09-10 DIAGNOSIS — Z951 Presence of aortocoronary bypass graft: Secondary | ICD-10-CM

## 2018-09-10 DIAGNOSIS — S72001A Fracture of unspecified part of neck of right femur, initial encounter for closed fracture: Secondary | ICD-10-CM | POA: Diagnosis present

## 2018-09-10 DIAGNOSIS — E1122 Type 2 diabetes mellitus with diabetic chronic kidney disease: Secondary | ICD-10-CM | POA: Diagnosis not present

## 2018-09-10 DIAGNOSIS — G4700 Insomnia, unspecified: Secondary | ICD-10-CM | POA: Diagnosis present

## 2018-09-10 DIAGNOSIS — Z8719 Personal history of other diseases of the digestive system: Secondary | ICD-10-CM

## 2018-09-10 DIAGNOSIS — J45909 Unspecified asthma, uncomplicated: Secondary | ICD-10-CM

## 2018-09-10 DIAGNOSIS — I5042 Chronic combined systolic (congestive) and diastolic (congestive) heart failure: Secondary | ICD-10-CM | POA: Diagnosis present

## 2018-09-10 DIAGNOSIS — I130 Hypertensive heart and chronic kidney disease with heart failure and stage 1 through stage 4 chronic kidney disease, or unspecified chronic kidney disease: Secondary | ICD-10-CM

## 2018-09-10 DIAGNOSIS — J449 Chronic obstructive pulmonary disease, unspecified: Secondary | ICD-10-CM | POA: Diagnosis present

## 2018-09-10 DIAGNOSIS — Z9861 Coronary angioplasty status: Secondary | ICD-10-CM

## 2018-09-10 DIAGNOSIS — Z888 Allergy status to other drugs, medicaments and biological substances status: Secondary | ICD-10-CM

## 2018-09-10 DIAGNOSIS — Z7982 Long term (current) use of aspirin: Secondary | ICD-10-CM

## 2018-09-10 DIAGNOSIS — Z79899 Other long term (current) drug therapy: Secondary | ICD-10-CM

## 2018-09-10 HISTORY — PX: INTRAMEDULLARY (IM) NAIL INTERTROCHANTERIC: SHX5875

## 2018-09-10 LAB — BASIC METABOLIC PANEL
Anion gap: 14 (ref 5–15)
BUN: 11 mg/dL (ref 6–20)
CO2: 22 mmol/L (ref 22–32)
Calcium: 9 mg/dL (ref 8.9–10.3)
Chloride: 98 mmol/L (ref 98–111)
Creatinine, Ser: 1.13 mg/dL — ABNORMAL HIGH (ref 0.44–1.00)
GFR calc Af Amer: >60 mL/min (ref >60)
GFR calc non Af Amer: 53 mL/min — ABNORMAL LOW (ref >60)
Glucose, Bld: 225 mg/dL — ABNORMAL HIGH (ref 70–99)
Potassium: 3.7 mmol/L (ref 3.5–5.1)
Sodium: 134 mmol/L — ABNORMAL LOW (ref 135–145)

## 2018-09-10 LAB — CBC WITH DIFFERENTIAL/PLATELET
Abs Immature Granulocytes: 0.16 10*3/uL — ABNORMAL HIGH (ref 0.00–0.07)
Basophils Absolute: 0.1 10*3/uL (ref 0.0–0.1)
Basophils Relative: 0 %
Eosinophils Absolute: 0 10*3/uL (ref 0.0–0.5)
Eosinophils Relative: 0 %
HCT: 37.1 % (ref 36.0–46.0)
Hemoglobin: 12.4 g/dL (ref 12.0–15.0)
Immature Granulocytes: 1 %
Lymphocytes Relative: 7 %
Lymphs Abs: 1 10*3/uL (ref 0.7–4.0)
MCH: 29.9 pg (ref 26.0–34.0)
MCHC: 33.4 g/dL (ref 30.0–36.0)
MCV: 89.4 fL (ref 80.0–100.0)
Monocytes Absolute: 0.8 10*3/uL (ref 0.1–1.0)
Monocytes Relative: 6 %
Neutro Abs: 11 10*3/uL — ABNORMAL HIGH (ref 1.7–7.7)
Neutrophils Relative %: 86 %
Platelets: 278 10*3/uL (ref 150–400)
RBC: 4.15 MIL/uL (ref 3.87–5.11)
RDW: 17.6 % — ABNORMAL HIGH (ref 11.5–15.5)
WBC: 12.9 10*3/uL — ABNORMAL HIGH (ref 4.0–10.5)
nRBC: 0 % (ref 0.0–0.2)

## 2018-09-10 LAB — GLUCOSE, CAPILLARY
Glucose-Capillary: 102 mg/dL — ABNORMAL HIGH (ref 70–99)
Glucose-Capillary: 108 mg/dL — ABNORMAL HIGH (ref 70–99)

## 2018-09-10 LAB — TROPONIN I: Troponin I: <0.03 ng/mL (ref <0.03)

## 2018-09-10 LAB — PROTIME-INR
INR: 1 (ref 0.8–1.2)
Prothrombin Time: 13.1 seconds (ref 11.4–15.2)

## 2018-09-10 LAB — SURGICAL PCR SCREEN
MRSA, PCR: NEGATIVE
Staphylococcus aureus: NEGATIVE

## 2018-09-10 LAB — SARS CORONAVIRUS 2 BY RT PCR (HOSPITAL ORDER, PERFORMED IN ~~LOC~~ HOSPITAL LAB): SARS Coronavirus 2: NEGATIVE

## 2018-09-10 SURGERY — FIXATION, FRACTURE, INTERTROCHANTERIC, WITH INTRAMEDULLARY ROD
Anesthesia: General | Laterality: Right

## 2018-09-10 MED ORDER — ONDANSETRON HCL 4 MG/2ML IJ SOLN
INTRAMUSCULAR | Status: DC | PRN
  Administered 2018-09-10: 4 mg via INTRAVENOUS

## 2018-09-10 MED ORDER — ONDANSETRON HCL 4 MG/2ML IJ SOLN
4.0000 mg | Freq: Once | INTRAMUSCULAR | Status: AC
  Administered 2018-09-10: 4 mg via INTRAVENOUS
  Filled 2018-09-10: qty 2

## 2018-09-10 MED ORDER — LIDOCAINE 2% (20 MG/ML) 5 ML SYRINGE
INTRAMUSCULAR | Status: DC | PRN
  Administered 2018-09-10: 80 mg via INTRAVENOUS

## 2018-09-10 MED ORDER — ONDANSETRON HCL 4 MG PO TABS: 4.0000 mg | ORAL_TABLET | Freq: Four times a day (QID) | ORAL | Status: DC | PRN

## 2018-09-10 MED ORDER — POTASSIUM CHLORIDE IN NACL 20-0.9 MEQ/L-% IV SOLN
INTRAVENOUS | Status: DC
  Administered 2018-09-10: 22:00:00 via INTRAVENOUS
  Filled 2018-09-10: qty 1000

## 2018-09-10 MED ORDER — METHOCARBAMOL 1000 MG/10ML IJ SOLN
500.0000 mg | Freq: Four times a day (QID) | INTRAVENOUS | Status: DC | PRN
  Filled 2018-09-10: qty 5

## 2018-09-10 MED ORDER — GABAPENTIN 300 MG PO CAPS
300.0000 mg | ORAL_CAPSULE | Freq: Three times a day (TID) | ORAL | Status: DC
  Administered 2018-09-10 – 2018-09-11 (×2): 300 mg via ORAL
  Filled 2018-09-10 (×2): qty 1

## 2018-09-10 MED ORDER — BUPIVACAINE HCL (PF) 0.25 % IJ SOLN
INTRAMUSCULAR | Status: DC | PRN
  Administered 2018-09-10 (×2): 10 mL

## 2018-09-10 MED ORDER — CEFAZOLIN SODIUM-DEXTROSE 2-4 GM/100ML-% IV SOLN
2.0000 g | Freq: Four times a day (QID) | INTRAVENOUS | Status: AC
  Administered 2018-09-10 – 2018-09-11 (×2): 2 g via INTRAVENOUS
  Filled 2018-09-10 (×3): qty 100

## 2018-09-10 MED ORDER — FENTANYL CITRATE (PF) 250 MCG/5ML IJ SOLN
INTRAMUSCULAR | Status: AC
  Filled 2018-09-10: qty 5

## 2018-09-10 MED ORDER — INSULIN ASPART 100 UNIT/ML ~~LOC~~ SOLN: 0.0000 [IU] | Freq: Three times a day (TID) | SUBCUTANEOUS | Status: DC

## 2018-09-10 MED ORDER — PROPOFOL 10 MG/ML IV BOLUS
INTRAVENOUS | Status: DC | PRN
  Administered 2018-09-10: 100 mg via INTRAVENOUS

## 2018-09-10 MED ORDER — FENTANYL CITRATE (PF) 100 MCG/2ML IJ SOLN
INTRAMUSCULAR | Status: DC | PRN
  Administered 2018-09-10: 50 ug via INTRAVENOUS
  Administered 2018-09-10: 100 ug via INTRAVENOUS

## 2018-09-10 MED ORDER — PHENOL 1.4 % MT LIQD: 1.0000 | OROMUCOSAL | Status: DC | PRN

## 2018-09-10 MED ORDER — ACETAMINOPHEN 650 MG RE SUPP: 650.0000 mg | Freq: Four times a day (QID) | RECTAL | Status: DC | PRN

## 2018-09-10 MED ORDER — DEXAMETHASONE SODIUM PHOSPHATE 10 MG/ML IJ SOLN
INTRAMUSCULAR | Status: DC | PRN
  Administered 2018-09-10: 5 mg via INTRAVENOUS

## 2018-09-10 MED ORDER — CHLORHEXIDINE GLUCONATE 4 % EX LIQD: 60.0000 mL | Freq: Once | CUTANEOUS | Status: DC

## 2018-09-10 MED ORDER — VITAMIN B-12 100 MCG PO TABS
100.0000 ug | ORAL_TABLET | Freq: Every day | ORAL | Status: DC
  Administered 2018-09-11: 09:00:00 100 ug via ORAL
  Filled 2018-09-10: qty 1

## 2018-09-10 MED ORDER — ACETAMINOPHEN 325 MG PO TABS: 325.0000 mg | ORAL_TABLET | Freq: Four times a day (QID) | ORAL | Status: DC | PRN

## 2018-09-10 MED ORDER — MORPHINE SULFATE (PF) 4 MG/ML IV SOLN
6.0000 mg | Freq: Once | INTRAVENOUS | Status: AC
  Administered 2018-09-10: 6 mg via INTRAVENOUS
  Filled 2018-09-10: qty 2

## 2018-09-10 MED ORDER — HYDROMORPHONE HCL 1 MG/ML IJ SOLN
0.2500 mg | INTRAMUSCULAR | Status: DC | PRN
  Administered 2018-09-10 (×2): 0.5 mg via INTRAVENOUS

## 2018-09-10 MED ORDER — SODIUM CHLORIDE (PF) 0.9 % IJ SOLN
INTRAMUSCULAR | Status: AC
  Filled 2018-09-10: qty 10

## 2018-09-10 MED ORDER — ROCURONIUM BROMIDE 10 MG/ML (PF) SYRINGE
PREFILLED_SYRINGE | INTRAVENOUS | Status: DC | PRN
  Administered 2018-09-10: 50 mg via INTRAVENOUS

## 2018-09-10 MED ORDER — ROCURONIUM BROMIDE 10 MG/ML (PF) SYRINGE
PREFILLED_SYRINGE | INTRAVENOUS | Status: AC
  Filled 2018-09-10: qty 10

## 2018-09-10 MED ORDER — 0.9 % SODIUM CHLORIDE (POUR BTL) OPTIME
TOPICAL | Status: DC | PRN
  Administered 2018-09-10: 19:00:00 1000 mL

## 2018-09-10 MED ORDER — METOCLOPRAMIDE HCL 5 MG PO TABS: 5.0000 mg | ORAL_TABLET | Freq: Three times a day (TID) | ORAL | Status: DC | PRN

## 2018-09-10 MED ORDER — DEXAMETHASONE SODIUM PHOSPHATE 10 MG/ML IJ SOLN
INTRAMUSCULAR | Status: AC
  Filled 2018-09-10: qty 1

## 2018-09-10 MED ORDER — POLYETHYLENE GLYCOL 3350 17 G PO PACK
17.0000 g | PACK | Freq: Every day | ORAL | Status: DC
  Filled 2018-09-10: qty 1

## 2018-09-10 MED ORDER — METOCLOPRAMIDE HCL 5 MG/ML IJ SOLN: 5.0000 mg | Freq: Three times a day (TID) | INTRAMUSCULAR | Status: DC | PRN

## 2018-09-10 MED ORDER — CEFAZOLIN SODIUM-DEXTROSE 2-4 GM/100ML-% IV SOLN
2.0000 g | INTRAVENOUS | Status: AC
  Administered 2018-09-10: 2 g via INTRAVENOUS
  Filled 2018-09-10: qty 100

## 2018-09-10 MED ORDER — ONDANSETRON HCL 4 MG/2ML IJ SOLN: 4.0000 mg | Freq: Four times a day (QID) | INTRAMUSCULAR | Status: DC | PRN

## 2018-09-10 MED ORDER — HYDROMORPHONE HCL 1 MG/ML IJ SOLN: 0.5000 mg | INTRAMUSCULAR | Status: DC | PRN

## 2018-09-10 MED ORDER — MIDAZOLAM HCL 2 MG/2ML IJ SOLN
INTRAMUSCULAR | Status: AC
  Filled 2018-09-10: qty 2

## 2018-09-10 MED ORDER — HYDROMORPHONE HCL 1 MG/ML IJ SOLN: 1.0000 mg | INTRAMUSCULAR | Status: DC | PRN

## 2018-09-10 MED ORDER — GLIPIZIDE ER 10 MG PO TB24
10.0000 mg | ORAL_TABLET | Freq: Every day | ORAL | Status: DC
  Administered 2018-09-11: 10 mg via ORAL
  Filled 2018-09-10: qty 1

## 2018-09-10 MED ORDER — SUGAMMADEX SODIUM 200 MG/2ML IV SOLN
INTRAVENOUS | Status: DC | PRN
  Administered 2018-09-10: 200 mg via INTRAVENOUS

## 2018-09-10 MED ORDER — SODIUM CHLORIDE 0.9 % IV SOLN
INTRAVENOUS | Status: DC | PRN
  Administered 2018-09-10: 25 ug/min via INTRAVENOUS

## 2018-09-10 MED ORDER — INSULIN ASPART 100 UNIT/ML ~~LOC~~ SOLN: 0.0000 [IU] | Freq: Every day | SUBCUTANEOUS | Status: DC

## 2018-09-10 MED ORDER — LIDOCAINE 2% (20 MG/ML) 5 ML SYRINGE
INTRAMUSCULAR | Status: AC
  Filled 2018-09-10: qty 5

## 2018-09-10 MED ORDER — DOCUSATE SODIUM 100 MG PO CAPS
100.0000 mg | ORAL_CAPSULE | Freq: Two times a day (BID) | ORAL | Status: DC
  Administered 2018-09-10 – 2018-09-11 (×2): 100 mg via ORAL
  Filled 2018-09-10 (×2): qty 1

## 2018-09-10 MED ORDER — SODIUM CHLORIDE 0.9 % IV SOLN: Freq: Once | INTRAVENOUS | Status: DC

## 2018-09-10 MED ORDER — ASPIRIN EC 81 MG PO TBEC
81.0000 mg | DELAYED_RELEASE_TABLET | Freq: Every day | ORAL | Status: DC
  Administered 2018-09-11: 09:00:00 81 mg via ORAL
  Filled 2018-09-10 (×2): qty 1

## 2018-09-10 MED ORDER — VITAMIN B-1 100 MG PO TABS
100.0000 mg | ORAL_TABLET | Freq: Every day | ORAL | Status: DC
  Administered 2018-09-11: 100 mg via ORAL
  Filled 2018-09-10: qty 1

## 2018-09-10 MED ORDER — OXYCODONE HCL 5 MG PO TABS
5.0000 mg | ORAL_TABLET | ORAL | Status: DC | PRN
  Administered 2018-09-10: 10 mg via ORAL
  Administered 2018-09-11: 10:00:00 5 mg via ORAL
  Filled 2018-09-10: qty 2
  Filled 2018-09-10: qty 1

## 2018-09-10 MED ORDER — DIPHENHYDRAMINE HCL 50 MG/ML IJ SOLN
12.5000 mg | Freq: Once | INTRAMUSCULAR | Status: AC
  Administered 2018-09-10: 12.5 mg via INTRAVENOUS
  Filled 2018-09-10: qty 1

## 2018-09-10 MED ORDER — PROPOFOL 10 MG/ML IV BOLUS
INTRAVENOUS | Status: AC
  Filled 2018-09-10: qty 20

## 2018-09-10 MED ORDER — ROSUVASTATIN CALCIUM 20 MG PO TABS: 20.0000 mg | ORAL_TABLET | Freq: Every day | ORAL | Status: DC

## 2018-09-10 MED ORDER — LACTATED RINGERS IV SOLN
INTRAVENOUS | Status: DC
  Administered 2018-09-10 (×2): via INTRAVENOUS

## 2018-09-10 MED ORDER — PROMETHAZINE HCL 25 MG/ML IJ SOLN: 6.2500 mg | INTRAMUSCULAR | Status: DC | PRN

## 2018-09-10 MED ORDER — NITROGLYCERIN 0.4 MG SL SUBL: 0.4000 mg | SUBLINGUAL_TABLET | SUBLINGUAL | Status: DC | PRN

## 2018-09-10 MED ORDER — METHOCARBAMOL 500 MG PO TABS: 500.0000 mg | ORAL_TABLET | Freq: Four times a day (QID) | ORAL | Status: DC | PRN

## 2018-09-10 MED ORDER — MENTHOL 3 MG MT LOZG: 1.0000 | LOZENGE | OROMUCOSAL | Status: DC | PRN

## 2018-09-10 MED ORDER — PANTOPRAZOLE SODIUM 20 MG PO TBEC
20.0000 mg | DELAYED_RELEASE_TABLET | Freq: Every day | ORAL | Status: DC
  Administered 2018-09-11: 20 mg via ORAL
  Filled 2018-09-10: qty 1

## 2018-09-10 MED ORDER — CEFAZOLIN SODIUM-DEXTROSE 2-4 GM/100ML-% IV SOLN
INTRAVENOUS | Status: AC
  Filled 2018-09-10: qty 100

## 2018-09-10 MED ORDER — MORPHINE SULFATE (PF) 4 MG/ML IV SOLN
4.0000 mg | Freq: Once | INTRAVENOUS | Status: AC
  Administered 2018-09-10: 4 mg via INTRAVENOUS
  Filled 2018-09-10: qty 1

## 2018-09-10 MED ORDER — ACETAMINOPHEN 325 MG PO TABS: 650.0000 mg | ORAL_TABLET | Freq: Four times a day (QID) | ORAL | Status: DC | PRN

## 2018-09-10 MED ORDER — LINAGLIPTIN 5 MG PO TABS
5.0000 mg | ORAL_TABLET | Freq: Every day | ORAL | Status: DC
  Administered 2018-09-11: 09:00:00 5 mg via ORAL
  Filled 2018-09-10: qty 1

## 2018-09-10 MED ORDER — HYDROMORPHONE HCL 1 MG/ML IJ SOLN
INTRAMUSCULAR | Status: AC
  Filled 2018-09-10: qty 1

## 2018-09-10 MED ORDER — BUPIVACAINE HCL (PF) 0.25 % IJ SOLN
INTRAMUSCULAR | Status: AC
  Filled 2018-09-10: qty 20

## 2018-09-10 MED ORDER — HYDROMORPHONE HCL 1 MG/ML IJ SOLN
1.0000 mg | INTRAMUSCULAR | Status: DC | PRN
  Administered 2018-09-10: 1 mg via INTRAVENOUS
  Filled 2018-09-10: qty 1

## 2018-09-10 MED ORDER — ENOXAPARIN SODIUM 40 MG/0.4ML ~~LOC~~ SOLN: 40.0000 mg | SUBCUTANEOUS | Status: DC

## 2018-09-10 MED ORDER — NICOTINE 21 MG/24HR TD PT24
21.0000 mg | MEDICATED_PATCH | Freq: Once | TRANSDERMAL | Status: AC
  Administered 2018-09-10: 21 mg via TRANSDERMAL
  Filled 2018-09-10: qty 1

## 2018-09-10 MED ORDER — CARVEDILOL 12.5 MG PO TABS
12.5000 mg | ORAL_TABLET | Freq: Two times a day (BID) | ORAL | Status: DC
  Administered 2018-09-10 – 2018-09-11 (×2): 12.5 mg via ORAL
  Filled 2018-09-10 (×2): qty 1

## 2018-09-10 MED ORDER — MIDAZOLAM HCL 2 MG/2ML IJ SOLN
INTRAMUSCULAR | Status: DC | PRN
  Administered 2018-09-10: 2 mg via INTRAVENOUS

## 2018-09-10 MED ORDER — POVIDONE-IODINE 10 % EX SWAB
2.0000 "application " | Freq: Once | CUTANEOUS | Status: AC
  Administered 2018-09-10: 2 via TOPICAL

## 2018-09-10 MED ORDER — ISOSORBIDE MONONITRATE ER 30 MG PO TB24
30.0000 mg | ORAL_TABLET | Freq: Every day | ORAL | Status: DC
  Administered 2018-09-11: 09:00:00 30 mg via ORAL
  Filled 2018-09-10: qty 1

## 2018-09-10 MED ORDER — ONDANSETRON HCL 4 MG/2ML IJ SOLN
INTRAMUSCULAR | Status: AC
  Filled 2018-09-10: qty 4

## 2018-09-10 MED ORDER — BUPIVACAINE HCL (PF) 0.25 % IJ SOLN
INTRAMUSCULAR | Status: AC
  Filled 2018-09-10: qty 10

## 2018-09-10 SURGICAL SUPPLY — 54 items
BLADE CLIPPER SURG (BLADE) IMPLANT
BLADE SURG 15 STRL LF DISP TIS (BLADE) ×1 IMPLANT
BLADE SURG 15 STRL SS (BLADE) ×3
COVER PERINEAL POST (MISCELLANEOUS) ×3 IMPLANT
COVER SURGICAL LIGHT HANDLE (MISCELLANEOUS) ×3 IMPLANT
COVER WAND RF STERILE (DRAPES) ×3 IMPLANT
DRAPE HALF SHEET 40X57 (DRAPES) IMPLANT
DRAPE ORTHO SPLIT 77X108 STRL (DRAPES)
DRAPE STERI IOBAN 125X83 (DRAPES) ×3 IMPLANT
DRAPE SURG ORHT 6 SPLT 77X108 (DRAPES) IMPLANT
DRSG AQUACEL AG ADV 3.5X 6 (GAUZE/BANDAGES/DRESSINGS) ×2 IMPLANT
DRSG MEPILEX BORDER 4X4 (GAUZE/BANDAGES/DRESSINGS) IMPLANT
DRSG MEPILEX BORDER 4X8 (GAUZE/BANDAGES/DRESSINGS) ×3 IMPLANT
DURAPREP 26ML APPLICATOR (WOUND CARE) ×3 IMPLANT
ELECT REM PT RETURN 9FT ADLT (ELECTROSURGICAL) ×3
ELECTRODE REM PT RTRN 9FT ADLT (ELECTROSURGICAL) ×1 IMPLANT
FACESHIELD WRAPAROUND (MASK) ×3 IMPLANT
FACESHIELD WRAPAROUND OR TEAM (MASK) ×1 IMPLANT
GAUZE XEROFORM 5X9 LF (GAUZE/BANDAGES/DRESSINGS) ×3 IMPLANT
GLOVE BIOGEL PI IND STRL 8 (GLOVE) ×1 IMPLANT
GLOVE BIOGEL PI INDICATOR 8 (GLOVE) ×2
GLOVE SURG ORTHO 8.0 STRL STRW (GLOVE) ×3 IMPLANT
GOWN STRL REUS W/ TWL LRG LVL3 (GOWN DISPOSABLE) ×1 IMPLANT
GOWN STRL REUS W/ TWL XL LVL3 (GOWN DISPOSABLE) IMPLANT
GOWN STRL REUS W/TWL LRG LVL3 (GOWN DISPOSABLE) ×3
GOWN STRL REUS W/TWL XL LVL3 (GOWN DISPOSABLE)
GUIDE PIN 3.2X343 (PIN) ×1
GUIDE PIN 3.2X343MM (PIN) ×3
GUIDE ROD 3.0 (MISCELLANEOUS) ×3
KIT BASIN OR (CUSTOM PROCEDURE TRAY) ×3 IMPLANT
KIT TURNOVER KIT B (KITS) ×3 IMPLANT
LINER BOOT UNIVERSAL DISP (MISCELLANEOUS) ×3 IMPLANT
MANIFOLD NEPTUNE II (INSTRUMENTS) ×3 IMPLANT
NAIL TRIGEN INTERTAN RT 10X40 (Nail) ×2 IMPLANT
NS IRRIG 1000ML POUR BTL (IV SOLUTION) ×3 IMPLANT
PACK GENERAL/GYN (CUSTOM PROCEDURE TRAY) ×3 IMPLANT
PAD ARMBOARD 7.5X6 YLW CONV (MISCELLANEOUS) ×6 IMPLANT
PIN GUIDE 3.2X343MM (PIN) IMPLANT
ROD GUIDE 3.0 (MISCELLANEOUS) IMPLANT
SCREW LAG COMPR KIT 95/90 (Screw) ×2 IMPLANT
SPONGE LAP 4X18 RFD (DISPOSABLE) IMPLANT
STAPLER VISISTAT 35W (STAPLE) ×3 IMPLANT
SUT ETHILON 2 0 FS 18 (SUTURE) IMPLANT
SUT ETHILON 3 0 PS 1 (SUTURE) ×8 IMPLANT
SUT VIC AB 0 CT1 27 (SUTURE) ×3
SUT VIC AB 0 CT1 27XBRD ANBCTR (SUTURE) IMPLANT
SUT VIC AB 0 CTX 36 (SUTURE) ×3
SUT VIC AB 0 CTX36XBRD ANTBCTR (SUTURE) IMPLANT
SUT VIC AB 2-0 CTB1 (SUTURE) ×3 IMPLANT
SUT VICRYL 0 UR6 27IN ABS (SUTURE) ×4 IMPLANT
TAPE STRIPS DRAPE STRL (GAUZE/BANDAGES/DRESSINGS) IMPLANT
TOWEL OR 17X24 6PK STRL BLUE (TOWEL DISPOSABLE) ×3 IMPLANT
TOWEL OR 17X26 10 PK STRL BLUE (TOWEL DISPOSABLE) ×3 IMPLANT
WATER STERILE IRR 1000ML POUR (IV SOLUTION) ×3 IMPLANT

## 2018-09-10 NOTE — ED Notes (Signed)
Consulting provider at bedside

## 2018-09-10 NOTE — Anesthesia Preprocedure Evaluation (Signed)
Anesthesia Evaluation  Patient identified by MRN, date of birth, ID band Patient awake    Reviewed: Allergy & Precautions, NPO status , Patient's Chart, lab work & pertinent test results  Airway Mallampati: II  TM Distance: >3 FB Neck ROM: Full    Dental no notable dental hx.    Pulmonary neg pulmonary ROS, Current Smoker,    Pulmonary exam normal breath sounds clear to auscultation       Cardiovascular hypertension, + CAD, + Past MI, + CABG and +CHF  Normal cardiovascular exam Rhythm:Regular Rate:Normal     Neuro/Psych negative neurological ROS  negative psych ROS   GI/Hepatic negative GI ROS, Neg liver ROS,   Endo/Other  diabetes  Renal/GU Renal InsufficiencyRenal disease  negative genitourinary   Musculoskeletal negative musculoskeletal ROS (+)   Abdominal   Peds negative pediatric ROS (+)  Hematology negative hematology ROS (+)   Anesthesia Other Findings   Reproductive/Obstetrics negative OB ROS                             Anesthesia Physical Anesthesia Plan  ASA: III  Anesthesia Plan: General   Post-op Pain Management:    Induction: Intravenous  PONV Risk Score and Plan: 2 and Ondansetron, Dexamethasone and Treatment may vary due to age or medical condition  Airway Management Planned: Oral ETT  Additional Equipment:   Intra-op Plan:   Post-operative Plan: Extubation in OR  Informed Consent: I have reviewed the patients History and Physical, chart, labs and discussed the procedure including the risks, benefits and alternatives for the proposed anesthesia with the patient or authorized representative who has indicated his/her understanding and acceptance.     Dental advisory given  Plan Discussed with: CRNA and Surgeon  Anesthesia Plan Comments:         Anesthesia Quick Evaluation

## 2018-09-10 NOTE — Anesthesia Procedure Notes (Signed)
Procedure Name: Intubation Date/Time: 09/10/2018 5:53 PM Performed by: Barrington Ellison, CRNA Pre-anesthesia Checklist: Patient identified, Emergency Drugs available, Suction available and Patient being monitored Patient Re-evaluated:Patient Re-evaluated prior to induction Oxygen Delivery Method: Circle System Utilized Preoxygenation: Pre-oxygenation with 100% oxygen Induction Type: IV induction Ventilation: Mask ventilation without difficulty Laryngoscope Size: Mac and 3 Grade View: Grade II Tube type: Oral Tube size: 7.0 mm Number of attempts: 1 Airway Equipment and Method: Stylet and Oral airway Placement Confirmation: ETT inserted through vocal cords under direct vision,  positive ETCO2 and breath sounds checked- equal and bilateral Secured at: 21 cm Tube secured with: Tape Dental Injury: Teeth and Oropharynx as per pre-operative assessment

## 2018-09-10 NOTE — ED Provider Notes (Signed)
[facility] EMERGENCY DEPARTMENT Provider Note   CSN: [id number] Arrival date & time: 09/10/18  7824    History   Chief Complaint Chief Complaint  Patient presents with  . Hip Pain    HPI Tasha Myers is a 58 y.o. female.     HPI  58 year old female here with right hip pain.  Patient states she tried to get up on a bed today.  She tried to step on her right leg and noticed that it was asleep.  She fell, striking her her right hip.  She reports immediate onset of severe, 10/10, aching, throbbing, right hip pain.  She had exquisite pain with any kind movement.  No alleviating factors.  She denies any associated distal numbness or weakness.  The tingling has resolved and she states it was because it was asleep because while she was sleeping.  She is adamant she not hit her head.  She is on blood thinners.  She has never broken a bone before.  No other medical complaints.  Past Medical History:  Diagnosis Date  . CAD S/P percutaneous coronary angioplasty    a. s/p BMS stent to RCA ~2000; b. PTCA of ISR in 2001; c. 09/2013 Cath: LM nl, LAD 57m, LCX min irregs, RI min irregs, RCA 20p ISR.  Marland Kitchen Chronic combined systolic and diastolic CHF (congestive heart failure) ([place])    a. 09/2013 Echo: EF 45-50%, no rwma, Gr2 DD, mild AI, mildly dil LA.  . CKD (chronic kidney disease), stage III ([place])   . COPD (chronic obstructive pulmonary disease) ([place])   . Essential hypertension   . HLD (hyperlipidemia)   . Insomnia   . MI (myocardial infarction) ([place])   . Thoracic or lumbosacral neuritis or radiculitis, unspecified   . Type II diabetes mellitus [facility])     Patient Active Problem List   Diagnosis Date Noted  . CKD (chronic kidney disease), stage III ([place])   . Essential hypertension   . Type II diabetes mellitus ([place])   . Acute on chronic systolic and diastolic heart failure, NYHA class 3 ([place]) 09/30/2013  . HTN (hypertension), malignant   . HLD (hyperlipidemia)   .  Chronic combined systolic and diastolic CHF (congestive heart failure) ([place])   . CAD S/P percutaneous coronary angioplasty   . ACS (acute coronary syndrome) ([place]) 09/28/2013  . Malignant hypertension 09/28/2013  . HTN (hypertension) 10/25/2010  . CAD (coronary artery disease) 10/25/2010  . Hyperlipemia 10/25/2010  . Diabetes mellitus ([place]) 10/25/2010    Past Surgical History:  Procedure Laterality Date  . BREAST BIOPSY Right 2014   neg x 3 areas  . BREAST EXCISIONAL BIOPSY Left YRS AGO   NEG  . CARPAL TUNNEL RELEASE     BILATERAL  . CORONARY ARTERY BYPASS GRAFT    . LEFT HEART CATHETERIZATION WITH CORONARY ANGIOGRAM N/A 09/28/2013   Procedure: LEFT HEART CATHETERIZATION WITH CORONARY ANGIOGRAM;  Surgeon: Leonie Man, MD;  Location: [facility] CATH LAB;  Service: Cardiovascular;  Laterality: N/A;  . LYMPH NODE RESECTION    . PARTIAL HYSTERECTOMY       OB History   No obstetric history on file.      Home Medications    Prior to Admission medications   Medication Sig Start Date End Date Taking? Authorizing Provider  aspirin EC 81 MG tablet Take 81 mg by mouth daily.     [provider]  carvedilol (COREG) 25 MG tablet Take 25 mg by mouth 2 (two)  times daily with a meal.      [provider]  clopidogrel (PLAVIX) 75 MG tablet TAKE 1 TABLET BY MOUTH ONCE DAILY. 07/10/17   Minna Merritts, MD  furosemide (LASIX) 40 MG tablet Take 40 mg by mouth daily.    [provider]  glipiZIDE (GLUCOTROL XL) 5 MG 24 hr tablet Take 5 mg by mouth daily.    [provider]  isosorbide mononitrate (IMDUR) 30 MG 24 hr tablet Take 30 mg by mouth daily.    [provider]  lisinopril (PRINIVIL,ZESTRIL) 20 MG tablet Take 1 tablet (20 mg total) by mouth daily. 09/30/13   Eileen Stanford, PA-C  metFORMIN (GLUCOPHAGE) 1000 MG tablet Take 1 tablet (1,000 mg total) by mouth 2 (two) times daily with a meal. 10/01/13   Eileen Stanford, PA-C  nitroGLYCERIN  (NITROSTAT) 0.4 MG SL tablet Place 0.4 mg under the tongue every 5 (five) minutes as needed for chest pain.     [provider]  pantoprazole (PROTONIX) 20 MG tablet Take by mouth. 06/17/17 06/17/18  [provider]  pravastatin (PRAVACHOL) 80 MG tablet Take 1 tablet (80 mg total) by mouth at bedtime. 09/30/13   Eileen Stanford, PA-C  pregabalin (LYRICA) 150 MG capsule Take 150 mg by mouth 2 (two) times daily.    [provider]  sitaGLIPtin (JANUVIA) 100 MG tablet Take by mouth. 10/15/16 10/15/17  [provider]    Family History Family History  Problem Relation Age of Onset  . Coronary artery disease Mother   . Hypertension Mother   . Stroke Mother   . Diabetes Father   . Stroke Unknown        sibling  . Coronary artery disease Unknown        sibling  . Diabetes Unknown        sibling  . Hypertension Unknown        sibling  . Breast cancer Neg Hx     Social History Social History   Tobacco Use  . Smoking status: Current Every Day Smoker    Packs/day: 1.00    Years: 40.00    Pack years: 40.00    Types: Cigarettes  . Smokeless tobacco: Never Used  Substance Use Topics  . Alcohol use: Yes    Comment: occasionally  . Drug use: No     Allergies   Empagliflozin   Review of Systems Review of Systems  Constitutional: Negative for chills and fever.  HENT: Negative for congestion, rhinorrhea and sore throat.   Eyes: Negative for visual disturbance.  Respiratory: Negative for cough, shortness of breath and wheezing.   Cardiovascular: Negative for chest pain and leg swelling.  Gastrointestinal: Negative for abdominal pain, diarrhea, nausea and vomiting.  Genitourinary: Negative for dysuria, flank pain, vaginal bleeding and vaginal discharge.  Musculoskeletal: Positive for arthralgias and gait problem. Negative for neck pain.  Skin: Negative for rash.  Allergic/Immunologic: Negative for immunocompromised state.  Neurological: Negative for  syncope and headaches.  Hematological: Does not bruise/bleed easily.  All other systems reviewed and are negative.    Physical Exam Updated Vital Signs BP (!) 156/75 (BP Location: Right Arm)   Pulse (!) 102   Temp 99.7 F (37.6 C) (Oral)   Resp 17   SpO2 96%   Physical Exam Vitals signs and nursing note reviewed.  Constitutional:      General: She is not in acute distress.    Appearance: She is well-developed.  HENT:  Head: Normocephalic and atraumatic.  Eyes:     Conjunctiva/sclera: Conjunctivae normal.  Neck:     Musculoskeletal: Neck supple.  Cardiovascular:     Rate and Rhythm: Normal rate and regular rhythm.     Heart sounds: Normal heart sounds. No murmur. No friction rub.  Pulmonary:     Effort: Pulmonary effort is normal. No respiratory distress.     Breath sounds: Normal breath sounds. No wheezing or rales.  Abdominal:     General: There is no distension.     Palpations: Abdomen is soft.     Tenderness: There is no abdominal tenderness.  Skin:    General: Skin is warm.     Capillary Refill: Capillary refill takes less than 2 seconds.  Neurological:     Mental Status: She is alert and oriented to person, place, and time.     Motor: No abnormal muscle tone.     LOWER EXTREMITY EXAM: RLE  INSPECTION & PALPATION: RLE shortened, internally rotated w/ exquisite TTP with any pROM or TTP over greater troch.  SENSORY: sensation is intact to light touch in:  Superficial peroneal nerve distribution (over dorsum of foot) Deep peroneal nerve distribution (over first dorsal web space) Sural nerve distribution (over lateral aspect 5th metatarsal) Saphenous nerve distribution (over medial instep)  MOTOR:  + Motor EHL (great toe dorsiflexion) + FHL (great toe plantar flexion)  + TA (ankle dorsiflexion)  + GSC (ankle plantar flexion)  VASCULAR: 2+ dorsalis pedis and posterior tibialis pulses Capillary refill < 2 sec, toes warm and  well-perfused  COMPARTMENTS: Soft, warm, well-perfused No pain with passive extension No parethesias   ED Treatments / Results  Labs (all labs ordered are listed, but only abnormal results are displayed) Labs Reviewed  CBC WITH DIFFERENTIAL/PLATELET - Abnormal; Notable for the following components:      Result Value   WBC 12.9 (*)    RDW 17.6 (*)    Neutro Abs 11.0 (*)    Abs Immature Granulocytes 0.16 (*)    All other components within normal limits  BASIC METABOLIC PANEL - Abnormal; Notable for the following components:   Sodium 134 (*)    Glucose, Bld 225 (*)    Creatinine, Ser 1.13 (*)    GFR calc non Af Amer 53 (*)    All other components within normal limits  SARS CORONAVIRUS 2 (HOSPITAL ORDER, [facility])  PROTIME-INR  TROPONIN I    EKG EKG Interpretation  Date/Time:  Wednesday Sep 10 2018 09:55:31 EDT Ventricular Rate:  101 PR Interval:    QRS Duration: 162 QT Interval:  421 QTC Calculation: 546 R Axis:   66 Text Interpretation:  Sinus tachycardia Left bundle branch block No significant change since last tracing Confirmed by Duffy Bruce [phone]) on 09/10/2018 11:45:34 AM   Radiology Dg Chest 1 View  Result Date: 09/10/2018 CLINICAL DATA:  59 year old female with fall and sharp pain EXAM: CHEST  1 VIEW COMPARISON:  06/11/2015 FINDINGS: Cardiomediastinal silhouette unchanged in size and contour. Coarsened interstitial markings with no pneumothorax pleural effusion or confluent airspace disease. No displaced fracture. IMPRESSION: Negative for acute cardiopulmonary disease Electronically Signed   By: Corrie Mckusick D.O.   On: 09/10/2018 10:44   Dg Hip Unilat W Or Wo Pelvis 2-3 Views Right  Result Date: 09/10/2018 CLINICAL DATA:  Fall this morning with right hip pain EXAM: DG HIP (WITH OR WITHOUT PELVIS) 2-3V RIGHT COMPARISON:  None. FINDINGS: Acute intertrochanteric right femur fracture with  mild lateral displacement. The lesser  trochanter is retracted as an isolated fragment. No evident pelvic ring fracture or diastasis. Atherosclerosis. IMPRESSION: Acute intertrochanteric right femur fracture. Electronically Signed   By: Monte Fantasia M.D.   On: 09/10/2018 10:43   Dg Femur Min 2 Views Right  Result Date: 09/10/2018 CLINICAL DATA:  Fall this morning with right hip pain EXAM: RIGHT FEMUR 2 VIEWS COMPARISON:  None. FINDINGS: Acute intertrochanteric right femur fracture with retraction of the lesser trochanter. The mid and lower femur are intact. Located knee. IMPRESSION: Acute intertrochanteric right femur fracture. Electronically Signed   By: Monte Fantasia M.D.   On: 09/10/2018 10:43    Procedures Procedures (including critical care time)  Medications Ordered in ED Medications  morphine 4 MG/ML injection 6 mg (6 mg Intravenous Given 09/10/18 1006)  ondansetron (ZOFRAN) injection 4 mg (4 mg Intravenous Given 09/10/18 1006)     Initial Impression / Assessment and Plan / ED Course  I have reviewed the triage vital signs and the nursing notes.  Pertinent labs & imaging results that were available during my care of the patient were reviewed by me and considered in my medical decision making (see chart for details).        58 yo F here with R hip pain. Imaging shows right IT fx. Orthopedics consulted. No head injury. She was in usual state of health until fall and labs reassuring. Mild leukocytosis is likely reactive, but will check UA, COVID-19, and CXR. No signs of sepsis. No other areas of pain or injury. Will admit to medicine.  Final Clinical Impressions(s) / ED Diagnoses   Final diagnoses:  Closed fracture of right hip, initial encounter [facility])    ED Discharge Orders    None       Duffy Bruce, MD 09/10/18 1145

## 2018-09-10 NOTE — Transfer of Care (Addendum)
Immediate Anesthesia Transfer of Care Note  Patient: Tasha Myers  Procedure(s) Performed: INTRAMEDULLARY (IM) NAIL INTERTROCHANTRIC (Right )  Patient Location: PACU  Anesthesia Type:General  Level of Consciousness: awake and alert   Airway & Oxygen Therapy: Patient Spontanous Breathing and Patient connected to face mask oxygen  Post-op Assessment: Report given to RN and Post -op Vital signs reviewed and stable  Post vital signs: Reviewed and stable  Last Vitals:  Vitals Value Taken Time  BP 124/62 09/10/2018  7:35 PM  Temp    Pulse 81 09/10/2018  7:37 PM  Resp 14 09/10/2018  7:37 PM  SpO2 86 % 09/10/2018  7:37 PM  Vitals shown include unvalidated device data.  Last Pain:  Vitals:   09/10/18 1600  TempSrc:   PainSc: 5          Complications: No apparent anesthesia complications

## 2018-09-10 NOTE — ED Notes (Signed)
Patient transported to x-ray. ?

## 2018-09-10 NOTE — ED Triage Notes (Signed)
Patient to ED from home via [facility] for R hip and leg pain after mechanical slip/fall off her bed this morning around 2am. Patient states she has a very tall bed and upon standing up, didn't realize her R foot was asleep and leg gave out. Patient fell on R side against her hardwood floor. Denies hitting head/no head or neck pain, no LOC. Denies other injuries.   Swelling to R hip and some shortening of leg noted upon arrival.   EMS VS: 176/84, HR 102 ST, RR 21, 98.66F temporal, 94% 2L O2 [place], CBG 284. 20g. PIV RAC, given 111mcg fentanyl and 4mg  zofran PTA.

## 2018-09-10 NOTE — Brief Op Note (Signed)
   09/10/2018  7:20 PM  PATIENT:  Tasha Myers  58 y.o. female  PRE-OPERATIVE DIAGNOSIS:  Right hip fx  POST-OPERATIVE DIAGNOSIS:  Right hip fx  PROCEDURE:  Procedure(s): INTRAMEDULLARY (IM) NAIL INTERTROCHANTRIC  SURGEON:  Surgeon(s): Marlou Sa, Tonna Corner, MD  ASSISTANT: NONE  ANESTHESIA:   general  EBL: 50 ml    Total I/O In: 1000 [I.V.:1000] Out: -   BLOOD ADMINISTERED: none  DRAINS: none   LOCAL MEDICATIONS USED:  none  SPECIMEN:  No Specimen  COUNTS:  YES  TOURNIQUET:  * No tourniquets in log *  DICTATION: .Other Dictation: Dictation Number [id number]  PLAN OF CARE: Admit to inpatient   PATIENT DISPOSITION:  PACU - hemodynamically stable

## 2018-09-10 NOTE — ED Notes (Addendum)
ED TO INPATIENT HANDOFF REPORT  ED Nurse Name and Phone #: Nigel Mormon [phone]  S Name/Age/Gender Tasha Myers 58 y.o. female Room/Bed: 016C/016C  Code Status   Code Status: Full Code  Home/SNF/Other Home Patient oriented to: self, place, time and situation Is this baseline? Yes   Triage Complete: Triage complete  Chief Complaint sick  Triage Note Patient to ED from home via [facility] for R hip and leg pain after mechanical slip/fall off her bed this morning around 2am. Patient states she has a very tall bed and upon standing up, didn't realize her R foot was asleep and leg gave out. Patient fell on R side against her hardwood floor. Denies hitting head/no head or neck pain, no LOC. Denies other injuries.   Swelling to R hip and some shortening of leg noted upon arrival.   EMS VS: 176/84, HR 102 ST, RR 21, 98.57F temporal, 94% 2L O2 [place], CBG 284. 20g. PIV RAC, given 18mcg fentanyl and 4mg  zofran PTA.    Allergies Allergies  Allergen Reactions  . Empagliflozin Other (See Comments)    UTI/sepsis    Level of Care/Admitting Diagnosis ED Disposition    ED Disposition Condition Comment   Admit  Hospital Area: [facility] [[id number]]  Level of Care: Telemetry Medical [573]  Covid Evaluation: N/A  Diagnosis: Closed right hip fracture [facility]) [[id number]]  Admitting Physician: Axel Filler [phone]  Attending Physician: Axel Filler [[id number]]  Estimated length of stay: past midnight tomorrow  Certification:: I certify this patient will need inpatient services for at least 2 midnights  PT Class (Do Not Modify): Inpatient [101]  PT Acc Code (Do Not Modify): Private [1]       B Medical/Surgery History Past Medical History:  Diagnosis Date  . CAD S/P percutaneous coronary angioplasty    a. s/p BMS stent to RCA ~2000; b. PTCA of ISR in 2001; c. 09/2013 Cath: LM nl, LAD 65m, LCX min irregs, RI min irregs, RCA 20p ISR.  Marland Kitchen Chronic  combined systolic and diastolic CHF (congestive heart failure) ([place])    a. 09/2013 Echo: EF 45-50%, no rwma, Gr2 DD, mild AI, mildly dil LA.  . CKD (chronic kidney disease), stage III ([place])   . COPD (chronic obstructive pulmonary disease) ([place])   . Essential hypertension   . HLD (hyperlipidemia)   . Insomnia   . MI (myocardial infarction) ([place])   . Thoracic or lumbosacral neuritis or radiculitis, unspecified   . Type II diabetes mellitus ([place])    Past Surgical History:  Procedure Laterality Date  . BREAST BIOPSY Right 2014   neg x 3 areas  . BREAST EXCISIONAL BIOPSY Left YRS AGO   NEG  . CARPAL TUNNEL RELEASE     BILATERAL  . CORONARY ARTERY BYPASS GRAFT    . LEFT HEART CATHETERIZATION WITH CORONARY ANGIOGRAM N/A 09/28/2013   Procedure: LEFT HEART CATHETERIZATION WITH CORONARY ANGIOGRAM;  Surgeon: Leonie Man, MD;  Location: [facility] CATH LAB;  Service: Cardiovascular;  Laterality: N/A;  . LYMPH NODE RESECTION    . PARTIAL HYSTERECTOMY       A IV Location/Drains/Wounds Patient Lines/Drains/Airways Status   Active Line/Drains/Airways    Name:   Placement date:   Placement time:   Site:   Days:   Peripheral IV 09/10/18 Right Antecubital   09/10/18    0930    Antecubital   less than 1          Intake/Output Last 24 hours  No intake or output data in the 24 hours ending 09/10/18 1455  Labs/Imaging Results for orders placed or performed during the hospital encounter of 09/10/18 (from the past 48 hour(s))  CBC with Differential     Status: Abnormal   Collection Time: 09/10/18 10:03 AM  Result Value Ref Range   WBC 12.9 (H) 4.0 - 10.5 K/uL   RBC 4.15 3.87 - 5.11 MIL/uL   Hemoglobin 12.4 12.0 - 15.0 g/dL   HCT 37.1 36.0 - 46.0 %   MCV 89.4 80.0 - 100.0 fL   MCH 29.9 26.0 - 34.0 pg   MCHC 33.4 30.0 - 36.0 g/dL   RDW 17.6 (H) 11.5 - 15.5 %   Platelets 278 150 - 400 K/uL   nRBC 0.0 0.0 - 0.2 %   Neutrophils Relative % 86 %   Neutro Abs 11.0 (H) 1.7 - 7.7 K/uL   Lymphocytes  Relative 7 %   Lymphs Abs 1.0 0.7 - 4.0 K/uL   Monocytes Relative 6 %   Monocytes Absolute 0.8 0.1 - 1.0 K/uL   Eosinophils Relative 0 %   Eosinophils Absolute 0.0 0.0 - 0.5 K/uL   Basophils Relative 0 %   Basophils Absolute 0.1 0.0 - 0.1 K/uL   Immature Granulocytes 1 %   Abs Immature Granulocytes 0.16 (H) 0.00 - 0.07 K/uL    Comment: Performed at [facility], [address] [place], [place] [address]  Basic metabolic panel     Status: Abnormal   Collection Time: 09/10/18 10:03 AM  Result Value Ref Range   Sodium 134 (L) 135 - 145 mmol/L   Potassium 3.7 3.5 - 5.1 mmol/L   Chloride 98 98 - 111 mmol/L   CO2 22 22 - 32 mmol/L   Glucose, Bld 225 (H) 70 - 99 mg/dL   BUN 11 6 - 20 mg/dL   Creatinine, Ser 1.13 (H) 0.44 - 1.00 mg/dL   Calcium 9.0 8.9 - 10.3 mg/dL   GFR calc non Af Amer 53 (L) >60 mL/min   GFR calc Af Amer >60 >60 mL/min   Anion gap 14 5 - 15    Comment: Performed at [facility], [place] [address] [place], [place] [address]  Protime-INR     Status: None   Collection Time: 09/10/18 10:03 AM  Result Value Ref Range   Prothrombin Time 13.1 11.4 - 15.2 seconds   INR 1.0 0.8 - 1.2    Comment: (NOTE) INR goal varies based on device and disease states. Performed at [facility], [place] [address], [place], [place] [address]   Troponin I - ONCE - STAT     Status: None   Collection Time: 09/10/18 10:03 AM  Result Value Ref Range   Troponin I <0.03 <0.03 ng/mL    Comment: Performed at [place] [address] [place], [place] [address]  SARS Coronavirus 2 (CEPHEID - Performed in [facility] hospital lab), Hosp Order     Status: None   Collection Time: 09/10/18 10:58 AM  Result Value Ref Range   SARS Coronavirus 2 NEGATIVE NEGATIVE    Comment: (NOTE) If result is NEGATIVE SARS-CoV-2 target nucleic acids are NOT DETECTED. The SARS-CoV-2 RNA is generally detectable in upper and lower  respiratory specimens during the acute phase of infection.  The lowest  concentration of SARS-CoV-2 viral copies this assay can detect is 250  copies / mL. A negative result does not preclude SARS-CoV-2 infection  and should  not be used as the sole basis for treatment or other  patient management decisions.  A negative result may occur with  improper specimen collection / handling, submission of specimen other  than nasopharyngeal swab, presence of viral mutation(s) within the  areas targeted by this assay, and inadequate number of viral copies  (<250 copies / mL). A negative result must be combined with clinical  observations, patient history, and epidemiological information. If result is POSITIVE SARS-CoV-2 target nucleic acids are DETECTED. The SARS-CoV-2 RNA is generally detectable in upper and lower  respiratory specimens dur ing the acute phase of infection.  Positive  results are indicative of active infection with SARS-CoV-2.  Clinical  correlation with patient history and other diagnostic information is  necessary to determine patient infection status.  Positive results do  not rule out bacterial infection or co-infection with other viruses. If result is PRESUMPTIVE POSTIVE SARS-CoV-2 nucleic acids MAY BE PRESENT.   A presumptive positive result was obtained on the submitted specimen  and confirmed on repeat testing.  While 2019 novel coronavirus  (SARS-CoV-2) nucleic acids may be present in the submitted sample  additional confirmatory testing may be necessary for epidemiological  and / or clinical management purposes  to differentiate between  SARS-CoV-2 and other Sarbecovirus currently known to infect humans.  If clinically indicated additional testing with an alternate test  methodology [phone]) is advised. The SARS-CoV-2 RNA is generally  detectable in upper and lower respiratory sp ecimens during the acute  phase of infection. The expected result is Negative. Fact Sheet for Patients:   StrictlyIdeas.no Fact Sheet for Healthcare Providers: BankingDealers.co.za This test is not yet approved or cleared by the [place] FDA and has been authorized for detection and/or diagnosis of SARS-CoV-2 by FDA under an Emergency Use Authorization (EUA).  This EUA will remain in effect (meaning this test can be used) for the duration of the COVID-19 declaration under Section 564(b)(1) of the Act, 21 U.S.C. section 360bbb-3(b)(1), unless the authorization is terminated or revoked sooner. Performed at [facility], [place] [address], [place], [place] [address]    Dg Chest 1 View  Result Date: 09/10/2018 CLINICAL DATA:  58 year old female with fall and sharp pain EXAM: CHEST  1 VIEW COMPARISON:  06/11/2015 FINDINGS: Cardiomediastinal silhouette unchanged in size and contour. Coarsened interstitial markings with no pneumothorax pleural effusion or confluent airspace disease. No displaced fracture. IMPRESSION: Negative for acute cardiopulmonary disease Electronically Signed   By: Corrie Mckusick D.O.   On: 09/10/2018 10:44   Dg Hip Unilat W Or Wo Pelvis 2-3 Views Right  Result Date: 09/10/2018 CLINICAL DATA:  Fall this morning with right hip pain EXAM: DG HIP (WITH OR WITHOUT PELVIS) 2-3V RIGHT COMPARISON:  None. FINDINGS: Acute intertrochanteric right femur fracture with mild lateral displacement. The lesser trochanter is retracted as an isolated fragment. No evident pelvic ring fracture or diastasis. Atherosclerosis. IMPRESSION: Acute intertrochanteric right femur fracture. Electronically Signed   By: Monte Fantasia M.D.   On: 09/10/2018 10:43   Dg Femur Min 2 Views Right  Result Date: 09/10/2018 CLINICAL DATA:  Fall this morning with right hip pain EXAM: RIGHT FEMUR 2 VIEWS COMPARISON:  None. FINDINGS: Acute intertrochanteric right femur fracture with retraction of the lesser trochanter. The mid and lower femur are intact. Located knee.  IMPRESSION: Acute intertrochanteric right femur fracture. Electronically Signed   By: Monte Fantasia M.D.   On: 09/10/2018 10:43    Pending Labs Unresulted Labs (From admission, onward)  Start     Ordered   09/11/18 0500  HIV antibody (Routine Testing)  Tomorrow morning,   R     09/10/18 1220   09/11/18 0500  Comprehensive metabolic panel  Tomorrow morning,   R     09/10/18 1220   09/11/18 0500  CBC  Tomorrow morning,   R     09/10/18 1220          Vitals/Pain Today's Vitals   09/10/18 1315 09/10/18 1330 09/10/18 1331 09/10/18 1454  BP: 123/74 115/66    Pulse: 96 96    Resp: 16 18    Temp:      TempSrc:      SpO2: 95% 98%    PainSc:   7  8     Isolation Precautions No active isolations  Medications Medications  nicotine (NICODERM CQ - dosed in mg/24 hours) patch 21 mg (21 mg Transdermal Patch Applied 09/10/18 1230)  enoxaparin (LOVENOX) injection 40 mg (has no administration in time range)  acetaminophen (TYLENOL) tablet 650 mg (has no administration in time range)    Or  acetaminophen (TYLENOL) suppository 650 mg (has no administration in time range)  polyethylene glycol (MIRALAX / GLYCOLAX) packet 17 g (has no administration in time range)  thiamine (VITAMIN B-1) tablet 100 mg (has no administration in time range)  carvedilol (COREG) tablet 12.5 mg (has no administration in time range)  isosorbide mononitrate (IMDUR) 24 hr tablet 30 mg (has no administration in time range)  vitamin B-12 (CYANOCOBALAMIN) tablet 100 mcg (has no administration in time range)  pantoprazole (PROTONIX) EC tablet 20 mg (has no administration in time range)  rosuvastatin (CRESTOR) tablet 20 mg (has no administration in time range)  insulin aspart (novoLOG) injection 0-15 Units (has no administration in time range)  insulin aspart (novoLOG) injection 0-5 Units (has no administration in time range)  HYDROmorphone (DILAUDID) injection 1 mg (has no administration in time range)  morphine 4  MG/ML injection 6 mg (6 mg Intravenous Given 09/10/18 1006)  ondansetron (ZOFRAN) injection 4 mg (4 mg Intravenous Given 09/10/18 1006)  morphine 4 MG/ML injection 4 mg (4 mg Intravenous Given 09/10/18 1247)  diphenhydrAMINE (BENADRYL) injection 12.5 mg (12.5 mg Intravenous Given 09/10/18 1415)    Mobility walks High fall risk   Focused Assessments Cardiac Assessment Handoff:  Cardiac Rhythm: Normal sinus rhythm Lab Results  Component Value Date   TROPONINI <0.03 09/10/2018   No results found for: DDIMER Does the Patient currently have chest pain? 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

## 2018-09-10 NOTE — Plan of Care (Signed)
  Problem: Education: Goal: Knowledge of General Education information will improve Description: Including pain rating scale, medication(s)/side effects and non-pharmacologic comfort measures Outcome: Progressing   Problem: Pain Managment: Goal: General experience of comfort will improve Outcome: Progressing   Problem: Safety: Goal: Ability to remain free from injury will improve Outcome: Progressing   

## 2018-09-10 NOTE — Anesthesia Postprocedure Evaluation (Signed)
Anesthesia Post Note  Patient: Tasha Myers  Procedure(s) Performed: INTRAMEDULLARY (IM) NAIL INTERTROCHANTRIC (Right )     Patient location during evaluation: PACU Anesthesia Type: General Level of consciousness: awake and alert Pain management: pain level controlled Vital Signs Assessment: post-procedure vital signs reviewed and stable Respiratory status: spontaneous breathing, nonlabored ventilation, respiratory function stable and patient connected to nasal cannula oxygen Cardiovascular status: blood pressure returned to baseline and stable Postop Assessment: no apparent nausea or vomiting Anesthetic complications: no    Last Vitals:  Vitals:   09/10/18 2020 09/10/18 2035  BP: 139/67 (!) 149/65  Pulse: 81 78  Resp: 10 14  Temp:    SpO2: 99% 98%    Last Pain:  Vitals:   09/10/18 2035  TempSrc:   PainSc: [name]

## 2018-09-10 NOTE — Consult Note (Signed)
Reason for Consult:Right hip fx Referring Physician: C Jomarie Myers is an 58 y.o. female.  HPI: Tasha Myers was doing a puzzle sitting on the side of her bed and her foot fell asleep. She went to get up and then fell, landing on her right side. She had immediate hip pain and could not get up. She was brought to the ED where x-rays showed a right hip fx and orthopedic surgery was consulted.  Past Medical History:  Diagnosis Date  . CAD S/P percutaneous coronary angioplasty    a. s/p BMS stent to RCA ~2000; b. PTCA of ISR in 2001; Tasha. 09/2013 Cath: LM nl, LAD 28m, LCX min irregs, RI min irregs, RCA 20p ISR.  Marland Kitchen Chronic combined systolic and diastolic CHF (congestive heart failure) ([place])    a. 09/2013 Echo: EF 45-50%, no rwma, Gr2 DD, mild AI, mildly dil LA.  . CKD (chronic kidney disease), stage III ([place])   . COPD (chronic obstructive pulmonary disease) ([place])   . Essential hypertension   . HLD (hyperlipidemia)   . Insomnia   . MI (myocardial infarction) ([place])   . Thoracic or lumbosacral neuritis or radiculitis, unspecified   . Type II diabetes mellitus ([place])     Past Surgical History:  Procedure Laterality Date  . BREAST BIOPSY Right 2014   neg x 3 areas  . BREAST EXCISIONAL BIOPSY Left YRS AGO   NEG  . CARPAL TUNNEL RELEASE     BILATERAL  . CORONARY ARTERY BYPASS GRAFT    . LEFT HEART CATHETERIZATION WITH CORONARY ANGIOGRAM N/A 09/28/2013   Procedure: LEFT HEART CATHETERIZATION WITH CORONARY ANGIOGRAM;  Surgeon: Leonie Man, MD;  Location: [facility] CATH LAB;  Service: Cardiovascular;  Laterality: N/A;  . LYMPH NODE RESECTION    . PARTIAL HYSTERECTOMY      Family History  Problem Relation Age of Onset  . Coronary artery disease Mother   . Hypertension Mother   . Stroke Mother   . Diabetes Father   . Stroke Unknown        sibling  . Coronary artery disease Unknown        sibling  . Diabetes Unknown        sibling  . Hypertension Unknown        sibling  . Breast cancer  Neg Hx     Social History:  reports that she has been smoking cigarettes. She has a 40.00 pack-year smoking history. She has never used smokeless tobacco. She reports current alcohol use. She reports that she does not use drugs.  Allergies:  Allergies  Allergen Reactions  . Empagliflozin Other (See Comments)    UTI/sepsis    Medications: I have reviewed the patient's current medications.  Results for orders placed or performed during the hospital encounter of 09/10/18 (from the past 48 hour(s))  CBC with Differential     Status: Abnormal   Collection Time: 09/10/18 10:03 AM  Result Value Ref Range   WBC 12.9 (H) 4.0 - 10.5 K/uL   RBC 4.15 3.87 - 5.11 MIL/uL   Hemoglobin 12.4 12.0 - 15.0 g/dL   HCT 37.1 36.0 - 46.0 %   MCV 89.4 80.0 - 100.0 fL   MCH 29.9 26.0 - 34.0 pg   MCHC 33.4 30.0 - 36.0 g/dL   RDW 17.6 (H) 11.5 - 15.5 %   Platelets 278 150 - 400 K/uL   nRBC 0.0 0.0 - 0.2 %   Neutrophils Relative % 86 %  Neutro Abs 11.0 (H) 1.7 - 7.7 K/uL   Lymphocytes Relative 7 %   Lymphs Abs 1.0 0.7 - 4.0 K/uL   Monocytes Relative 6 %   Monocytes Absolute 0.8 0.1 - 1.0 K/uL   Eosinophils Relative 0 %   Eosinophils Absolute 0.0 0.0 - 0.5 K/uL   Basophils Relative 0 %   Basophils Absolute 0.1 0.0 - 0.1 K/uL   Immature Granulocytes 1 %   Abs Immature Granulocytes 0.16 (H) 0.00 - 0.07 K/uL    Comment: Performed at [facility] [place] [address], [place], [place] [address]  Basic metabolic panel     Status: Abnormal   Collection Time: 09/10/18 10:03 AM  Result Value Ref Range   Sodium 134 (L) 135 - 145 mmol/L   Potassium 3.7 3.5 - 5.1 mmol/L   Chloride 98 98 - 111 mmol/L   CO2 22 22 - 32 mmol/L   Glucose, Bld 225 (H) 70 - 99 mg/dL   BUN 11 6 - 20 mg/dL   Creatinine, Ser 1.13 (H) 0.44 - 1.00 mg/dL   Calcium 9.0 8.9 - 10.3 mg/dL   GFR calc non Af Amer 53 (L) >60 mL/min   GFR calc Af Amer >60 >60 mL/min   Anion gap 14 5 - 15    Comment: Performed at [facility],  [place] [address], [place], [place] [address]  Protime-INR     Status: None   Collection Time: 09/10/18 10:03 AM  Result Value Ref Range   Prothrombin Time 13.1 11.4 - 15.2 seconds   INR 1.0 0.8 - 1.2    Comment: (NOTE) INR goal varies based on device and disease states. Performed at [facility], [place] [address], [place], [place] [address]   Troponin I - ONCE - STAT     Status: None   Collection Time: 09/10/18 10:03 AM  Result Value Ref Range   Troponin I <0.03 <0.03 ng/mL    Comment: Performed at [facility] [address] [place], [place] [address]    Dg Chest 1 View  Result Date: 09/10/2018 CLINICAL DATA:  58 year old female with fall and sharp pain EXAM: CHEST  1 VIEW COMPARISON:  06/11/2015 FINDINGS: Cardiomediastinal silhouette unchanged in size and contour. Coarsened interstitial markings with no pneumothorax pleural effusion or confluent airspace disease. No displaced fracture. IMPRESSION: Negative for acute cardiopulmonary disease Electronically Signed   By: Corrie Mckusick D.O.   On: 09/10/2018 10:44   Dg Hip Unilat W Or Wo Pelvis 2-3 Views Right  Result Date: 09/10/2018 CLINICAL DATA:  Fall this morning with right hip pain EXAM: DG HIP (WITH OR WITHOUT PELVIS) 2-3V RIGHT COMPARISON:  None. FINDINGS: Acute intertrochanteric right femur fracture with mild lateral displacement. The lesser trochanter is retracted as an isolated fragment. No evident pelvic ring fracture or diastasis. Atherosclerosis. IMPRESSION: Acute intertrochanteric right femur fracture. Electronically Signed   By: Monte Fantasia M.D.   On: 09/10/2018 10:43   Dg Femur Min 2 Views Right  Result Date: 09/10/2018 CLINICAL DATA:  Fall this morning with right hip pain EXAM: RIGHT FEMUR 2 VIEWS COMPARISON:  None. FINDINGS: Acute intertrochanteric right femur fracture with retraction of the lesser trochanter. The mid and lower femur are intact. Located knee. IMPRESSION: Acute intertrochanteric right femur  fracture. Electronically Signed   By: Monte Fantasia M.D.   On: 09/10/2018 10:43    Review of Systems  Constitutional: Negative for weight loss.  HENT: Negative for ear discharge, ear  pain, hearing loss and tinnitus.   Eyes: Negative for blurred vision, double vision, photophobia and pain.  Respiratory: Negative for cough, sputum production and shortness of breath.   Cardiovascular: Negative for chest pain.  Gastrointestinal: Negative for abdominal pain, nausea and vomiting.  Genitourinary: Negative for dysuria, flank pain, frequency and urgency.  Musculoskeletal: Positive for joint pain (Right hip). Negative for back pain, falls, myalgias and neck pain.  Neurological: Negative for dizziness, tingling, sensory change, focal weakness, loss of consciousness and headaches.  Endo/Heme/Allergies: Does not bruise/bleed easily.  Psychiatric/Behavioral: Negative for depression, memory loss and substance abuse. The patient is not nervous/anxious.    Blood pressure (!) 156/75, pulse (!) 102, temperature 99.7 F (37.6 Tasha), temperature source Oral, resp. rate 17, SpO2 96 %. Physical Exam  Constitutional: She appears well-developed and well-nourished. No distress.  HENT:  Head: Normocephalic and atraumatic.  Eyes: Conjunctivae are normal. Right eye exhibits no discharge. Left eye exhibits no discharge. No scleral icterus.  Neck: Normal range of motion.  Cardiovascular: Normal rate and regular rhythm.  Respiratory: Effort normal. No respiratory distress.  Musculoskeletal:     Comments: RLE No traumatic wounds, ecchymosis, or rash  Mild TTP, mod pain with PROM  No knee or ankle effusion  Knee stable to varus/ valgus and anterior/posterior stress  Sens DPN, SPN, TN paresthetic  Motor EHL, ext, flex, evers 5/5  DP 1+, PT 0, No significant edema  Neurological: She is alert.  Skin: Skin is warm and dry. She is not diaphoretic.  Psychiatric: She has a normal mood and affect. Her behavior is normal.     Assessment/Plan: Right hip fx -- Plan IMN this afternoon around 1700 by Dr. Marlou Sa. Please keep NPO until then. Multiple medical problems including HTN, HLD, DM, CHF, CKD, COPD, and tobacco use -- IM to admit and manage and clear for OR, appreciate their assistance. Counseled on tobacco cessation.    Lisette Abu, PA-[facility] [phone] 09/10/2018, 11:53 AM

## 2018-09-10 NOTE — H&P (Addendum)
Date: 09/10/2018               Patient Name:  Tasha [facility] MRN: [id number]  DOB: 02-12-1961 Age / Sex: 58 y.o., female   PCP: Ballard Russell, MD         Medical Service: Internal Medicine Teaching Service         Attending Physician: Dr. Evette Doffing     First Contact: Dr. Myrtie Hawk Pager: [phone]  Second Contact: Dr. Shan Levans  Pager: [phone]       After Hours (After 5p/  First Contact Pager: [phone]  weekends / holidays): Second Contact Pager: [phone]   Chief Complaint: Mechanical Fall, Right hip pain  History of Present Illness: Tasha Myers is a 58 year old very pleasant woman with type 2 diabetes mellitus, combined systolic and diastolic heart failure (EF 45-50% 2015), essential hypertension, LBBB, coronary artery disease status post angioplasty (2000), asthma, interstitial lung disease, GERD, Hx of duodenal ulcer, CKD stage III, Chronic back pain, tobacco use disorder who presented to [facility] on Sep 10, 2018 with right hip pain following a mechanical fall.  She was doing well prior to this fall. She states that she was trying to get a remote control from the floor, her leg had fallen asleep and unfortunately she landed on her right side and hit her right hip.  She experienced severe pain at lateral side of her right hip that radiated to her right groin. She denies any new numbness or tingling or weakness of legs after that. Denies any trauma to his head. No LOC. Denies any chest pain, SOB, palpitation before the fall. She came to ED for management.  Meds:  Current Meds  Medication Sig  . amLODipine (NORVASC) 2.5 MG tablet Take 2.5 mg by mouth daily.  . carvedilol (COREG) 12.5 MG tablet Take 12.5 mg by mouth 2 (two) times daily with a meal.   . clopidogrel (PLAVIX) 75 MG tablet TAKE 1 TABLET BY MOUTH ONCE DAILY. (Patient taking differently: Take 75 mg by mouth daily. )  . furosemide (LASIX) 40 MG tablet Take 60 mg by mouth daily.   Marland Kitchen gabapentin (NEURONTIN) 300 MG capsule  Take 300 mg by mouth 2 (two) times daily as needed (Burning nerve pain).   Marland Kitchen glipiZIDE (GLUCOTROL XL) 5 MG 24 hr tablet Take 10 mg by mouth daily.   . isosorbide mononitrate (IMDUR) 30 MG 24 hr tablet Take 30 mg by mouth daily.  . nitroGLYCERIN (NITROSTAT) 0.4 MG SL tablet Place 0.4 mg under the tongue every 5 (five) minutes as needed for chest pain.   Marland Kitchen oxyCODONE (ROXICODONE) 15 MG immediate release tablet Take 15 mg by mouth every 12 (twelve) hours. May take up to 4 additional tablets (15 mg each) monthly as needed For Breakthrough pain  . pantoprazole (PROTONIX) 20 MG tablet Take 20 mg by mouth daily.   . rosuvastatin (CRESTOR) 20 MG tablet Take 20 mg by mouth daily.  . sitaGLIPtin (JANUVIA) 50 MG tablet Take 50 mg by mouth daily.   . vitamin B-12 (CYANOCOBALAMIN) 100 MCG tablet Take 100 mcg by mouth daily.     Allergies: Allergies as of 09/10/2018 - Review Complete 09/10/2018  Allergen Reaction Noted  . Empagliflozin Other (See Comments) 03/06/2018   Past Medical History:  Diagnosis Date  . CAD S/P percutaneous coronary angioplasty    a. s/p BMS stent to RCA ~2000; b. PTCA of ISR in 2001; c. 09/2013 Cath: LM nl, LAD 4m, LCX min irregs,  RI min irregs, RCA 20p ISR.  Marland Kitchen Chronic combined systolic and diastolic CHF (congestive heart failure) ([place])    a. 09/2013 Echo: EF 45-50%, no rwma, Gr2 DD, mild AI, mildly dil LA.  . CKD (chronic kidney disease), stage III ([place])   . COPD (chronic obstructive pulmonary disease) ([place])   . Essential hypertension   . HLD (hyperlipidemia)   . Insomnia   . MI (myocardial infarction) ([place])   . Thoracic or lumbosacral neuritis or radiculitis, unspecified   . Type II diabetes mellitus (HCC)     Family History: Sister with cancer (colon?), sister with stroke and heart failure Social History: [facility] endorses smoking 1 pack per day, drinks 2 beers about 2 times per week, no illicit drug use  Review of Systems: A complete ROS was negative except as per  HPI.   Physical Exam: Blood pressure (!) 156/75, pulse (!) 102, temperature 99.7 F (37.6 C), temperature source Oral, resp. rate 17, SpO2 96 %.  Physical Exam  Constitutional: She is oriented to person, place, and time and well-developed, well-nourished, and in no distress. No distress.  HENT:  Head: Normocephalic and atraumatic.  Eyes: EOM are normal.  Neck: JVD present.  Cardiovascular: Normal rate and regular rhythm.  Murmur heard. Pulmonary/Chest: Effort normal and breath sounds normal. No respiratory distress. She has no wheezes. She has no rales.  Abdominal: Soft. She exhibits no distension. There is no abdominal tenderness.  Neurological: She is alert and oriented to person, place, and time. She has normal sensation.  Psychiatric: Mood, affect and judgment normal.  Extremities: Tenderness present at right hip. skin is intact, no bruise, pulses are present, well perfused  EKG: personally reviewed my interpretation is LBBB (old)  CXR: personally reviewed my interpretation is no acute cardiopulmonary abnormality Hip x-ray : Acute intertrochanteric right femur fracture, checks x-ray negative for acute cardiopulmonary disease.   Assessment & Plan by Problem: Active Problems:   Closed right hip fracture ([place])  Tasha Myers is a 58 year old very pleasant woman with T2DM, cCHF (EF 45-50% 2015), ILD, HTN, chronic LBBB, CAD s/p angioplasty (2000), asthma, GERD, Hx of duodenal ulcer, CKD stage III, chronic thoracic back pain, ILD, tobacco use disorder here with acute right intertrochanteric fracture following a mechanical fall.    Closed right intertrochanteric femur Fx 2/2 mechanical fall:  Presents with acute right intertrochanteric fracture following a mechanical fall. No history of osteoporosis noted on chart. Pain is controled after received Ms at ED.  Orthopedic surgery has been consulted and evaluated patient in ED. Planned to perform surgery today. review RCRI score of 2 putting  her at a 10.1% risk of cardiac complication after surgery. Early fixation of the fracture will be associated with better outcome, and will go ahead for doing surgery.  -NPO for surgery today -Appreciate orthopedic surgery recommendations after surgery -Tylenol 650 mg q 6h PRN -Dilaudid 1 mg q 8h PRN  CAD, CHF EF (45-50% 2015): Asymptomatic. Has mild JVD, will limit IV fluid -Continue ASA and Plavix -Continue IMDUR 30 mg QD -Continue Crestor 20 mg QD -Continue Coreg 12.5 mg BID -Cardiac monitoring -Resume Lasix tomorrow  Chronic kidney disease?  Current GFR is normal at >60. Cr 1.13 -BMP daily  Type 2 diabetes mellitus with neuropathy:   - SSI -CBG monitoring -Continue gabapentin  Hypertension: Stable -Continue home Coreg -Restart Amlodipine tomorrow  -Monitor BP  FEN: Limit Fluid, replete electrolyte as needed, Npo for surgery VTE ppx: Lovenox CODE STATUS: Full  Dispo: Admit patient  to Inpatient with expected length of stay greater than 2 midnights.  SignedDewayne Hatch, MD 09/10/2018, 12:56 PM  Pager: [phone] IMTS PGY-1

## 2018-09-11 DIAGNOSIS — Z720 Tobacco use: Secondary | ICD-10-CM

## 2018-09-11 LAB — PROTIME-INR
INR: 1.1 (ref 0.8–1.2)
Prothrombin Time: 14.3 seconds (ref 11.4–15.2)

## 2018-09-11 LAB — COMPREHENSIVE METABOLIC PANEL
ALT: 13 U/L (ref 0–44)
AST: 19 U/L (ref 15–41)
Albumin: 3.4 g/dL — ABNORMAL LOW (ref 3.5–5.0)
Alkaline Phosphatase: 70 U/L (ref 38–126)
Anion gap: 12 (ref 5–15)
BUN: 15 mg/dL (ref 6–20)
CO2: 24 mmol/L (ref 22–32)
Calcium: 8.7 mg/dL — ABNORMAL LOW (ref 8.9–10.3)
Chloride: 98 mmol/L (ref 98–111)
Creatinine, Ser: 1.49 mg/dL — ABNORMAL HIGH (ref 0.44–1.00)
GFR calc Af Amer: 44 mL/min — ABNORMAL LOW (ref >60)
GFR calc non Af Amer: 38 mL/min — ABNORMAL LOW (ref >60)
Glucose, Bld: 283 mg/dL — ABNORMAL HIGH (ref 70–99)
Potassium: 4.2 mmol/L (ref 3.5–5.1)
Sodium: 134 mmol/L — ABNORMAL LOW (ref 135–145)
Total Bilirubin: 1 mg/dL (ref 0.3–1.2)
Total Protein: 6.8 g/dL (ref 6.5–8.1)

## 2018-09-11 LAB — CBC
HCT: 34.5 % — ABNORMAL LOW (ref 36.0–46.0)
Hemoglobin: 11.4 g/dL — ABNORMAL LOW (ref 12.0–15.0)
MCH: 29.5 pg (ref 26.0–34.0)
MCHC: 33 g/dL (ref 30.0–36.0)
MCV: 89.1 fL (ref 80.0–100.0)
Platelets: 258 10*3/uL (ref 150–400)
RBC: 3.87 MIL/uL (ref 3.87–5.11)
RDW: 18.1 % — ABNORMAL HIGH (ref 11.5–15.5)
WBC: 13.7 10*3/uL — ABNORMAL HIGH (ref 4.0–10.5)
nRBC: 0 % (ref 0.0–0.2)

## 2018-09-11 LAB — HIV ANTIBODY (ROUTINE TESTING W REFLEX): HIV Screen 4th Generation wRfx: NONREACTIVE

## 2018-09-11 LAB — GLUCOSE, CAPILLARY
Glucose-Capillary: 196 mg/dL — ABNORMAL HIGH (ref 70–99)
Glucose-Capillary: 144 mg/dL — ABNORMAL HIGH (ref 70–99)

## 2018-09-11 MED ORDER — ASPIRIN 81 MG PO TBEC: 81.0000 mg | DELAYED_RELEASE_TABLET | Freq: Every day | ORAL | 0 refills | Status: AC

## 2018-09-11 MED ORDER — ENOXAPARIN SODIUM 40 MG/0.4ML ~~LOC~~ SOLN: 40.0000 mg | SUBCUTANEOUS | 0 refills | Status: DC

## 2018-09-11 MED ORDER — ASPIRIN EC 81 MG PO TBEC: 81.0000 mg | DELAYED_RELEASE_TABLET | Freq: Two times a day (BID) | ORAL | Status: DC

## 2018-09-11 NOTE — Plan of Care (Signed)
  Problem: Clinical Measurements: Goal: Will remain free from infection Outcome: Progressing   Problem: Nutrition: Goal: Adequate nutrition will be maintained Outcome: Progressing   

## 2018-09-11 NOTE — Op Note (Signed)
NAME: Tasha Myers, [facility] MEDICAL RECORD SP:[id number] ACCOUNT [id number] DATE OF BIRTH:Jan 13, 1961 FACILITY: [facility] LOCATION: [facility] PHYSICIAN:GREGORY Randel Pigg, MD  OPERATIVE REPORT  DATE OF PROCEDURE:  09/10/2018  PREOPERATIVE DIAGNOSIS:  Right hip intertrochanteric femur fracture.  POSTOPERATIVE DIAGNOSIS:  Right hip intertrochanteric femur fracture.  PROCEDURE:  Right hip intertrochanteric femur fracture open reduction internal fixation using Smith and Nephew Meta-Tan nail, 10 x 40 cm with a 95 mm compression screw and a 90 mm derotational screw.  SURGEON:  Meredith Pel, MD  ASSISTANT:  None.  ANESTHESIA:  General.  INDICATIONS:  The patient is a 58 year old female with right hip intertrochanteric fracture.  presents for operative management after explanation of risks and benefits.  PROCEDURE IN DETAIL:  The patient was brought to the operating room where general anesthetic was induced.  Preoperative antibiotics administered.  Timeout was called.  Right leg prescrubbed with alcohol and Betadine, allowed to air dry.  Prepped with  DuraPrep solution and draped in a sterile manner.  The left leg placed in lithotomy position with the perineal nerve well padded.  Under fluoroscopic guidance, good reduction was achieved in both the AP and lateral planes.  Wall drape was utilized.   Incision was made 4 fingerbreadths proximal to the greater trochanter.  Guide pin placed through the tip and advanced down into the femur.  Proximal reaming was performed.  A guide pin was then placed for shaft reaming.  An 11.5 mm reamer was utilized.   A 10 mm nail was then placed in good position and alignment.  It should be noted that after placement of the guide pin that a bone hook was placed in order to reduce the inferior femoral neck into its anatomic position prior to reaming.  The nail was  placed and with the bone hook in position, reducing the femoral neck, the compression and lag screw were placed.   Excellent reduction was achieved.  Thorough irrigation was then performed of all 3 incisions, which were then irrigated and closed using 0  Vicryl suture, 2-0 Vicryl suture and 3-0 nylon.  Aquacel dressing placed.    The patient tolerated the procedure well without immediate complications.  He was transferred to the recovery room in stable condition.  AN/NUANCE  D:09/10/2018 T:09/11/2018 JOB:[id number]

## 2018-09-11 NOTE — Progress Notes (Signed)
3in1 and rolling walker delivered to bedside. Pt made aware that wheelchair is to be delivered to pt's home per CM. AVS given to pt. Pt refused discharge education. Pt stated "I don't have time for this. My granddaughter is out there waiting on me. I can read it at home." Pt escorted off the unit via wheelchair by volunteer services.

## 2018-09-11 NOTE — Social Work (Signed)
CSW acknowledging consult for SNF placement. Will follow for therapy recommendations.   Ski Polich, MSW, LCSWA  Clinical Social Work [phone]   

## 2018-09-11 NOTE — Progress Notes (Addendum)
   Subjective: HS #2, post op day 1. [facility] is doing well. Her pain is tolerable and she mentions that she was able to ambulate to reach the commode. No nausea or vomiting and tolerated PO diet. No SOB. She is willing to be discharged today. We talked about plan of being evaluated by PT and OT today. Also discussed she need to be on blood thinner to prevent clot after surgery. She verbalized understanding and agrees with plan.  Objective:  Vital signs in last 24 hours: Vitals:   09/10/18 2105 09/10/18 2139 09/11/18 0017 09/11/18 0423  BP: (!) 101/56 (!) 135/52 (!) 137/58 (!) 142/65  Pulse: 78 79 78 80  Resp: 11 18 12 16   Temp:  98.2 F (36.8 C) 98.5 F (36.9 C) 98.7 F (37.1 C)  TempSrc:  Oral Oral Oral  SpO2: 94% 99% 97% 99%  Weight:      Height:       Ph/E: General: pleasant lady, no acute distress CV: RRR, No JVD today Abdomen: Soft and non tender to palpation Extremities: Rt hip Surgery site's dressing is intact  Assessment/Plan:  Principal Problem:   Closed right hip fracture (HCC) Active Problems:   CAD (coronary artery disease)   Diabetes mellitus (HCC)   Chronic combined systolic and diastolic CHF (congestive heart failure) (HCC)   CKD (chronic kidney disease), stage III (HCC)   Hip fracture ([place])  Tasha Myers is a 58 year old very pleasant woman with T2DM, cCHF (EF 45-50% 2015), ILD, HTN, chronic LBBB, CAD s/p angioplasty (2000), asthma, GERD, Hx of duodenal ulcer, CKD stage III, chronic thoracic back pain, ILD, tobacco use disorder here with acute right intertrochanteric fracture following a mechanical fall. She underwent ORIF 09/10/2018.    Closed right intertrochanteric femur after a mechanical fall: S/p surgery 09/10/2018 Post op day 1 Receiving PRN OXY IR and Dilaudid for pain Is on Levonox for DVT ppx Has osteoporosis, holding off of bisphosphonate due to kidney disease. Recommending follow up outpatient.   -PT/OT eval and treat--> No follow up  needed -Discontinue PRN Dilaudid 1 mg q 8h PRN  -Continue PRN Oxycodone IR for pain (has enough pills at home) -Lovenox 40 mg SQ QD for total of 14 days after discharge  -Home health for Lovenox injection -F/u with PCP and orthopedics after Dc -DME orderd -Will DC today  -Appreciate orthopedics surgery recommendations before discharge  CAD, CHF EF (45-50% 2015): Stable, with good exercise capacity at home Asymptomatic. Euvolumic today  -Continue ASA  -Resume Plavix. Will need to follow up with PCP to decide about duration -Continue IMDUR 30 mg QD -Continue Crestor 20 mg QD -Continue Coreg 12.5 mg BID -Cardiac monitoring -Resume home Lasix  Chronic kidney disease III? GFR was normal at >60 on arrival with Cr 1.13 Cr increased to 1.49 today. Continue slow rate of IV fluid (N.S75 ml/h) while in hospital -BMP at follow up visit  Type 2 diabetes mellitus with neuropathy:  On SSI and Trajenta and glipizide in hospital -Resume home regimen after discharge  Hypertension: BP range 130s-140s/65 while on Coreg On Coreg and Amlodipine at home.  -Continue home meds after discharge  Dispo: Anticipated discharge today  Dewayne Hatch, MD 09/11/2018, 6:11 AM Pager: [phone]

## 2018-09-11 NOTE — Care Management (Signed)
    Durable Medical Equipment  (From admission, onward)         Start     Ordered   09/11/18 1030  For home use only DME lightweight manual wheelchair with seat cushion  Once    Comments:  Patient suffers from  Right hip intertrochanteric femur fracture open reduction internal fixation  which impairs their ability to perform daily activities like ambulating  in the home.  A cane  will not resolve  issue with performing activities of daily living. A wheelchair will allow patient to safely perform daily activities. Patient is not able to propel themselves in the home using a standard weight wheelchair due to  Right hip intertrochanteric femur fracture open reduction internal fixation . Patient can self propel in the lightweight wheelchair. Length of need six months . Accessories: elevating leg rests (ELRs), wheel locks, extensions and anti-tippers.   Seat and back cushion   09/11/18 1030   09/11/18 1029  For home use only DME 3 n 1  Once     09/11/18 1030   09/11/18 1029  For home use only DME Walker rolling  Once    Question:  Patient needs a walker to treat with the following condition  Answer:  S/P right hip fracture   09/11/18 1030

## 2018-09-11 NOTE — Discharge Summary (Signed)
Name: T[facility] MRN: [id number] DOB: 02-19-1961 58 y.o. PCP: Ballard Russell, MD  Date of Admission: 09/10/2018  9:49 AM Date of Discharge: 09/11/2018 Attending Physician: Dr. Evette Doffing Discharge Diagnosis: 1. Principal Problem:   Closed right hip fracture (HCC) Active Problems:   CAD (coronary artery disease)   Diabetes mellitus (HCC)   Chronic combined systolic and diastolic CHF (congestive heart failure) (HCC)   CKD (chronic kidney disease), stage III ([place])   Hip fracture [facility])    Discharge Medications: Allergies as of 09/11/2018      Reactions   Empagliflozin Other (See Comments)   UTI/sepsis      Medication List    STOP taking these medications   lisinopril 20 MG tablet Commonly known as:  ZESTRIL   metFORMIN 1000 MG tablet Commonly known as:  GLUCOPHAGE   pravastatin 80 MG tablet Commonly known as:  PRAVACHOL     TAKE these medications   amLODipine 2.5 MG tablet Commonly known as:  NORVASC Take 2.5 mg by mouth daily.   aspirin 81 MG EC tablet Take 1 tablet (81 mg total) by mouth daily.   carvedilol 12.5 MG tablet Commonly known as:  COREG Take 12.5 mg by mouth 2 (two) times daily with a meal.   clopidogrel 75 MG tablet Commonly known as:  PLAVIX TAKE 1 TABLET BY MOUTH ONCE DAILY.   furosemide 40 MG tablet Commonly known as:  LASIX Take 60 mg by mouth daily.   gabapentin 300 MG capsule Commonly known as:  NEURONTIN Take 300 mg by mouth 2 (two) times daily as needed (Burning nerve pain).   glipiZIDE 5 MG 24 hr tablet Commonly known as:  GLUCOTROL XL Take 10 mg by mouth daily.   isosorbide mononitrate 30 MG 24 hr tablet Commonly known as:  IMDUR Take 30 mg by mouth daily.   Januvia 50 MG tablet Generic drug:  sitaGLIPtin Take 50 mg by mouth daily.   nitroGLYCERIN 0.4 MG SL tablet Commonly known as:  NITROSTAT Place 0.4 mg under the tongue every 5 (five) minutes as needed for chest pain.   oxyCODONE 15 MG immediate release  tablet Commonly known as:  ROXICODONE Take 15 mg by mouth every 12 (twelve) hours. May take up to 4 additional tablets (15 mg each) monthly as needed For Breakthrough pain   pantoprazole 20 MG tablet Commonly known as:  PROTONIX Take 20 mg by mouth daily.   rosuvastatin 20 MG tablet Commonly known as:  CRESTOR Take 20 mg by mouth daily.   vitamin B-12 100 MCG tablet Commonly known as:  CYANOCOBALAMIN Take 100 mcg by mouth daily.       Disposition and follow-up:   [facility] was discharged from [facility] in stable condition.  At the hospital follow up visit please address:  1.  Please evaluate surgical wound area. Make sure patient gets physical therapy.  2. Orthopedic surgeon did not recommend DVT ppx after discharge since patient will be able to resume activity gently. Make sure she ambulates. If she fails to ambulate well post surgery at home, please consider anticoagulant such as Lovenox and make sure she follows up with ortho as well. 3. Please recheack BMP in few days to resume Metformin and Lisinopril, and bisphosphonate if kidney function improves to base line.  4. Encourage smoke cessation.  2.  Labs / imaging needed at time of follow-up: BMP  3.  Pending labs/ test needing follow-up: None  Follow-up Appointments: Follow-up Information  Ballard Russell, MD. Call in 1 week(s).   Specialty:  Family Medicine Why:  Follow up within a week Contact information: [place] [address] [phone]        Meredith Pel, MD. Schedule an appointment as soon as possible for a visit in 2 week(s).   Specialty:  Orthopedic Surgery Contact information: [place] [address] [place] [facility] Course by problem list: 1. Closed right intertrochanteric femur after a mechanical fall: Tasha Myers is a 58 year oldvery pleasant woman with T2DM,cCHF(EF  45-50%2015),ILD, HTN,chronicLBBB,CAD s/pangioplasty (2000),asthma, GERD,Hxof duodenal ulcer, CKD stage III?, chronic thoracic back pain, ILD, tobacco use disorder who came to the ED due to sever right hip pain with radiation to groin after mechanical fall. She found to have acute right intertrochanteric closed fracture. No head trauma or other injury due to fall. She underwent ORIF 09/10/2018. She tolerated the surgery well without acute complication. Pain well managed with Dilaudid and then with PRN OXY. Received Levonox for DVT ppx post surgery while in hospital. PT/OT evaluated the patient and did not recommend follow up. She will provided equipments for home. Her recovery after surgery was faster than expected, she started gentle ambulation.  Medically recommended to continue Lovenox for DVT prophylaxis after discharge that then canceled by orthopedics recommendation due to good recovery and as patient started to ambulate. Patient was willing to go home and to resume activity gently. She will take PRN Oxycodone for pain if needed.   CAD, CHF EF (45-50% 2015): Stable during this hospitalization without symptoms and euvolmeic on exam, . RCRI score of 2, put her at 10.1 % risk of surgery. Having said that, with controled CAD And CHF and with good exercise capacity at home, (metabolic equivalents >4) and due to importance of repair surgery, she underwent the operation. She tolerated the surgery well. Will discharge home to continue medical management.  Chronic kidney disease III? GFR was normal at >60 on arrival with Cr 1.13 AKI on CKD: Cr increased to 1.49 on second day of admission. Gave IV fluid Lisinopril and Metformin held at discharge and may resume in few days after BMP rechecks and goes back to baseline.  Type 2 diabetes mellituswith neuropathy: BG remained within acceptable range during this hospitalization and by SSI,Trajenta and glipizide. She is discharged to home to continue home  meds: Glipizide and Januvia (holding Metformin due to AKI for few days. It can be resumed in few days after AKI resolved.   Hypertension: Patient was on Coreg 12.5 mg BID, Lisinopril 20 mg QD and Amlodipine 2.5 mg QDat home. Amlodipine and Lisinopril initially held due to normal BP and prior to surgery. . BP range 130s-140s/65 while on Coreg.  She is discharged to continue home meds: Amlodipine and Coreg, and may resume Lisinopril after AKI resolves and Cr goes back to baseline.  Discharge Vitals:   BP 122/60 (BP Location: Right Arm)   Pulse 88   Temp (!) 97.5 F (36.4 C) (Oral)   Resp 16   Ht 5\' 6"  (1.676 m)   Wt 76.2 kg   SpO2 93%   BMI 27.12 kg/m   Pertinent Labs, Studies, and Procedures:  DG Hip unilateral right 09/10/2018 FINDINGS: Acute intertrochanteric right femur fracture with mild lateral displacement. The lesser trochanter is retracted as an isolated fragment. No evident pelvic ring fracture or diastasis. Atherosclerosis.  IMPRESSION: Acute intertrochanteric right femur fracture.  Discharge Instructions: Discharge Instructions    Call MD / Call 911   Complete by:  As directed    If you experience chest pain or shortness of breath, CALL 911 and be transported to the hospital emergency room.  If you develope a fever above 101 F, pus (white drainage) or increased drainage or redness at the wound, or calf pain, call your surgeon's office.   Call MD for:  severe uncontrolled pain   Complete by:  As directed    Call MD for:  temperature >100.4   Complete by:  As directed    Constipation Prevention   Complete by:  As directed    Drink plenty of fluids.  Prune juice may be helpful.  You may use a stool softener, such as Colace (over the counter) 100 mg twice a day.  Use MiraLax (over the counter) for constipation as needed.   Diet - low sodium heart healthy   Complete by:  As directed    Diet - low sodium heart healthy   Complete by:  As directed    Discharge  instructions   Complete by:  As directed    Partial weight bearing on left leg Ok to shower dressing waterproof   Discharge instructions   Complete by:  As directed    Thank you for allowing [place] taking care of you at [facility]. Please resume your aspirin and plavix. Make sure to follow up with your primary care doctor within a week and with orthopedics surgeon.  Take rest of your medications as before. You can take Oxycodone that you have at home 2-4 times per day as needed for pain control. Thank you   Increase activity slowly   Complete by:  As directed    Increase activity slowly as tolerated   Complete by:  As directed       Signed: Dewayne Hatch, MD 09/16/2018, 8:33 AM   Pager: [phone]

## 2018-09-11 NOTE — TOC Initial Note (Signed)
Transition of Care [facility]) - Initial/Assessment Note    Patient Details  Name: Tasha Myers MRN: [id number] Date of Birth: 1960/08/02  Transition of Care [facility]) CM/SW Contact:    Marilu Favre, RN Phone Number: 09/11/2018, 10:36 AM  Clinical Narrative:                 Patient from home alone, has multiple family members for support.Ordered walker, 3 in 1 and wheelchair    Expected Discharge Plan: Home/Self Care Barriers to Discharge: Continued Medical Work up   Patient Goals and CMS Choice Patient states their goals for this hospitalization and ongoing recovery are:: to go home  CMS Medicare.gov Compare Post Acute Care list provided to:: Patient Choice offered to / list presented to : NA  Expected Discharge Plan and Services Expected Discharge Plan: Home/Self Care   Discharge Planning Services: CM Consult Post Acute Care Choice: Durable Medical Equipment Living arrangements for the past 2 months: Single Family Home                 DME Arranged: 3-N-1, Walker rolling, Lightweight manual wheelchair with seat cushion DME Agency: [employer] Date DME Agency Contacted: 09/11/18 Time DME Agency Contacted: [phone] Representative spoke with at DME Agency: [place]: NA Equality Agency: NA        Prior Living Arrangements/Services Living arrangements for the past 2 months: [place] with:: Self Patient language and need for interpreter reviewed:: Yes Do you feel safe going back to the place where you live?: Yes      Need for Family Participation in Patient Care: Yes (Comment) Care giver support system in place?: Yes (comment)   Criminal Activity/Legal Involvement Pertinent to Current Situation/Hospitalization: No - Comment as needed  Activities of Daily Living Home Assistive Devices/Equipment: None ADL Screening (condition at time of admission) Patient's cognitive ability adequate to safely complete daily activities?: Yes Is the patient deaf or have  difficulty hearing?: No Does the patient have difficulty seeing, even when wearing glasses/contacts?: No Does the patient have difficulty concentrating, remembering, or making decisions?: No Patient able to express need for assistance with ADLs?: Yes Does the patient have difficulty dressing or bathing?: No Independently performs ADLs?: Yes (appropriate for developmental age) Does the patient have difficulty walking or climbing stairs?: Yes Weakness of Legs: Right Weakness of Arms/Hands: None  Permission Sought/Granted   Permission granted to share information with : Yes, Verbal Permission Granted     Permission granted to share info w AGENCY: [employer]        Emotional Assessment Appearance:: Appears stated age Attitude/Demeanor/Rapport: Engaged Affect (typically observed): Accepting Orientation: : Oriented to Self, Oriented to Place, Oriented to  Time, Oriented to Situation   Psych Involvement: No (comment)  Admission diagnosis:  Closed fracture of right hip, initial encounter [facility]) [S72.001A] Patient Active Problem List   Diagnosis Date Noted  . Closed right hip fracture ([place]) 09/10/2018  . Mediastinal lymphadenopathy 09/10/2018  . Hip fracture ([place]) 09/10/2018  . ILD (interstitial lung disease) ([place]) 06/05/2018  . CKD (chronic kidney disease), stage III ([place])   . Essential hypertension   . Lung nodule, multiple 11/05/2014  . HLD (hyperlipidemia)   . Chronic combined systolic and diastolic CHF (congestive heart failure) ([place])   . CAD (coronary artery disease) 10/25/2010  . Hyperlipemia 10/25/2010  . Diabetes mellitus ([place]) 10/25/2010   PCP:  Ballard Russell, MD Pharmacy:   [place], [place]  [address] [address] Phone: [phone] Fax: [phone]     Social Determinants of Health (SDOH) Interventions    Readmission Risk Interventions No flowsheet data found.

## 2018-09-11 NOTE — Plan of Care (Signed)
Problem: Education: Goal: Knowledge of General Education information will improve Description: Including pain rating scale, medication(s)/side effects and non-pharmacologic comfort measures Outcome: Progressing   Problem: Health Behavior/Discharge Planning: Goal: Ability to manage health-related needs will improve Outcome: Progressing   Problem: Clinical Measurements: Goal: Ability to maintain clinical measurements within normal limits will improve Outcome: Progressing Goal: Will remain free from infection Outcome: Progressing Goal: Respiratory complications will improve Outcome: Progressing Goal: Cardiovascular complication will be avoided Outcome: Progressing   Problem: Activity: Goal: Risk for activity intolerance will decrease Outcome: Progressing   Problem: Nutrition: Goal: Adequate nutrition will be maintained Outcome: Progressing   Problem: Coping: Goal: Level of anxiety will decrease Outcome: Progressing   Problem: Pain Managment: Goal: General experience of comfort will improve Outcome: Progressing   Problem: Safety: Goal: Ability to remain free from injury will improve Outcome: Progressing   Problem: Skin Integrity: Goal: Risk for impaired skin integrity will decrease Outcome: Progressing   

## 2018-09-11 NOTE — Evaluation (Signed)
Physical Therapy Evaluation Patient Details Name: Tasha Myers MRN: [id number] DOB: 10-04-1960 Today's Date: 09/11/2018   History of Present Illness  Pt is a 58 y.o. female admitted 09/10/18 after fall sustaining R hip intertrochanteric femur fx. Now s/p R femur ORIF 5/27. PMH includes CAD, COPD, CHF, DM2, CKD III.    Clinical Impression  Pt presents with an overall decrease in functional mobility secondary to above. PTA, pt indep and lives alone; multiple supportive family members nearby. Educ on RLE 25% PWB precautions, positioning, therex (HEP handout provided with therex progression), and importance of mobility. Today, pt able to ambulate and initiate stair training with RW at [place]. Pt requests w/c for home use due to difficulty performing household tasks. Pt moving well and motivated to return home today. If to remain admitted, will follow acutely to address established goals.    Follow Up Recommendations No PT follow up;Supervision for mobility/OOB(declined HHPT)    Equipment Recommendations  Rolling walker with 5" wheels;3in1 (PT);Wheelchair (measurements PT);Wheelchair cushion (measurements PT)    Recommendations for Other Services       Precautions / Restrictions Precautions Precautions: Fall Restrictions Weight Bearing Restrictions: Yes RLE Weight Bearing: Partial weight bearing RLE Partial Weight Bearing Percentage or Pounds: 25%      Mobility  Bed Mobility               General bed mobility comments: Received sitting in recliner  Transfers Overall transfer level: Needs assistance Equipment used: Rolling walker (2 wheeled) Transfers: Sit to/from Stand Sit to Stand: Supervision            Ambulation/Gait Ambulation/Gait assistance: Supervision Gait Distance (Feet): 180 Feet Assistive device: Rolling walker (2 wheeled) Gait Pattern/deviations: Step-through pattern;Decreased stride length;Decreased weight shift to right;Antalgic Gait  velocity: Decreased Gait velocity interpretation: <1.8 ft/sec, indicate of risk for recurrent falls General Gait Details: Slow, antalgic gait with RW and supervision for safety; intermittent cues for RLE 25% PWB. Pt seems to automatically be placing close to ~25% weight through RLE due to pain; heaay reliance on UE support  Stairs Stairs: Yes Stairs assistance: Min guard Stair Management: Backwards;Forwards;Step to pattern;With walker Number of Stairs: 1 General stair comments: Ascend/descend threshold step with RW; educ on technique. Min guard for balance  Wheelchair Mobility    Modified Rankin (Stroke Patients Only)       Balance Overall balance assessment: Needs assistance   Sitting balance-Leahy Scale: Good       Standing balance-Leahy Scale: Poor Standing balance comment: Reliant on UE support                             Pertinent Vitals/Pain Pain Assessment: Faces Faces Pain Scale: Hurts a little bit Pain Location: RLE Pain Descriptors / Indicators: Guarding;Sore Pain Intervention(s): Limited activity within patient's tolerance;Premedicated before session    [place] expects to be discharged to:: Private residence Living Arrangements: Alone Available Help at Discharge: Family;Available 24 hours/day Type of Home: House Home Access: Stairs to enter Entrance Stairs-Rails: Psychiatric nurse of Steps: 1 Home Layout: One level Home Equipment: None Additional Comments: Grandchildren and ex-husband visit pt daily    Prior Function Level of Independence: Independent               Hand Dominance        Extremity/Trunk Assessment   Upper Extremity Assessment Upper Extremity Assessment: Overall WFL for tasks assessed    Lower Extremity Assessment Lower Extremity  Assessment: RLE deficits/detail RLE Deficits / Details: s/p R femur ORIF; knee flex/ext 3/5, hip flex <3/5 limited by pain RLE: Unable to fully  assess due to pain RLE Coordination: decreased gross motor       Communication   Communication: No difficulties  Cognition Arousal/Alertness: Awake/alert Behavior During Therapy: WFL for tasks assessed/performed Overall Cognitive Status: Within Functional Limits for tasks assessed                                        General Comments General comments (skin integrity, edema, etc.): Spoke with daughter on phone    Exercises General Exercises - Lower Extremity Long Arc Quad: AROM;Right;Seated Hip Flexion/Marching: AAROM;Right;Seated Other Exercises Other Exercises: [employer] HEP handout [phone]) provided for therex progression: LAQ, seated hip flex, supine SLR, heel slides, standing hip flex/abd/ext (educ on NOT performing standing therex for LLE due to likelihood this would put >25% WB through RLE)   Assessment/Plan    PT Assessment Patient needs continued PT services  PT Problem List Decreased strength;Decreased range of motion;Decreased activity tolerance;Decreased balance;Decreased mobility;Decreased knowledge of use of DME;Decreased knowledge of precautions;Pain       PT Treatment Interventions DME instruction;Gait training;Stair training;Therapeutic activities;Functional mobility training;Therapeutic exercise;Balance training;Patient/family education    PT Goals (Current goals can be found in the Care Plan section)  Acute Rehab PT Goals Patient Stated Goal: Return home today PT Goal Formulation: With patient Time For Goal Achievement: 09/25/18 Potential to Achieve Goals: Good    Frequency Min 5X/week   Barriers to discharge        Co-evaluation               AM-PAC PT "6 Clicks" Mobility  Outcome Measure Help needed turning from your back to your side while in a flat bed without using bedrails?: A Little Help needed moving from lying on your back to sitting on the side of a flat bed without using bedrails?: A Little Help needed moving to  and from a bed to a chair (including a wheelchair)?: A Little Help needed standing up from a chair using your arms (e.g., wheelchair or bedside chair)?: A Little Help needed to walk in hospital room?: A Little Help needed climbing 3-5 steps with a railing? : A Little 6 Click Score: 18    End of Session Equipment Utilized During Treatment: Gait belt Activity Tolerance: Patient tolerated treatment well Patient left: in chair;with call bell/phone within reach Nurse Communication: Mobility status PT Visit Diagnosis: Other abnormalities of gait and mobility (R26.89);Pain Pain - Right/Left: Right Pain - part of body: Hip;Leg    Time: 1027-2536 PT Time Calculation (min) (ACUTE ONLY): 29 min   Charges:   PT Evaluation $PT Eval Low Complexity: 1 Low PT Treatments $Gait Training: 8-22 mins      Mabeline Caras, PT, DPT Acute Rehabilitation Services  Pager [phone] Office [place] 09/11/2018, 10:14 AM

## 2018-09-11 NOTE — Progress Notes (Signed)
  Subjective: Pt doing well - ok with PT   Objective: Vital signs in last 24 hours: Temp:  [98.2 F (36.8 C)-98.8 F (37.1 C)] 98.7 F (37.1 C) (05/28 1025) Pulse Rate:  [77-98] 86 (05/28 1025) Resp:  [10-24] 15 (05/28 1025) BP: (101-158)/(51-97) 135/56 (05/28 1025) SpO2:  [90 %-99 %] 97 % (05/28 1025) Weight:  [76.2 kg] 76.2 kg (05/27 1626)  Intake/Output from previous day: 05/27 0701 - 05/28 0700 In: 1585.4 [I.V.:1485.4; IV Piggyback:100] Out: 75 [Blood:75] Intake/Output this shift: Total I/O In: 150 [P.O.:150] Out: -   Exam:  Sensation intact distally Intact pulses distally Dorsiflexion/Plantar flexion intact  Labs: Recent Labs    09/10/18 1003 09/11/18 0200  HGB 12.4 11.4*   Recent Labs    09/10/18 1003 09/11/18 0200  WBC 12.9* 13.7*  RBC 4.15 3.87  HCT 37.1 34.5*  PLT 278 258   Recent Labs    09/10/18 1003 09/11/18 0200  NA 134* 134*  K 3.7 4.2  CL 98 98  CO2 22 24  BUN 11 15  CREATININE 1.13* 1.49*  GLUCOSE 225* 283*  CALCIUM 9.0 8.7*   Recent Labs    09/10/18 1003 09/11/18 0200  INR 1.0 1.1    Assessment/Plan: Plan dc today - pwbat - has pain meds at home   [employer] 09/11/2018, 12:57 PM

## 2018-09-11 NOTE — TOC Transition Note (Signed)
Transition of Care [facility]) - CM/SW Discharge Note   Patient Details  Name: Tasha Myers MRN: [id number] Date of Birth: 22-Apr-1960  Transition of Care [facility]) CM/SW Contact:  Marilu Favre, RN Phone Number: 09/11/2018, 2:34 PM   Clinical Narrative:     Patient refused home health nurse. Sent message to MD.  Final next level of care: Home/Self Care Barriers to Discharge: No Barriers Identified   Patient Goals and CMS Choice Patient states their goals for this hospitalization and ongoing recovery are:: to go home  CMS Medicare.gov Compare Post Acute Care list provided to:: Patient Choice offered to / list presented to : NA  Discharge Placement                       Discharge Plan and Services   Discharge Planning Services: CM Consult Post Acute Care Choice: Durable Medical Equipment          DME Arranged: 3-N-1, Walker rolling, Lightweight manual wheelchair with seat cushion DME Agency: [employer] Date DME Agency Contacted: 09/11/18 Time DME Agency Contacted: [phone] Representative spoke with at DME Agency: [place]: RN, Patient Refused [place] Agency: NA        Social Determinants of Health ([place]) Interventions     Readmission Risk Interventions No flowsheet data found.

## 2018-09-12 ENCOUNTER — Encounter (HOSPITAL_COMMUNITY): Payer: Self-pay | Admitting: Orthopedic Surgery

## 2018-09-25 ENCOUNTER — Ambulatory Visit (INDEPENDENT_AMBULATORY_CARE_PROVIDER_SITE_OTHER): Payer: Medicare Other

## 2018-09-25 ENCOUNTER — Ambulatory Visit (INDEPENDENT_AMBULATORY_CARE_PROVIDER_SITE_OTHER): Payer: Medicare Other | Admitting: Orthopedic Surgery

## 2018-09-25 ENCOUNTER — Encounter: Payer: Self-pay | Admitting: Orthopedic Surgery

## 2018-09-25 ENCOUNTER — Other Ambulatory Visit: Payer: Self-pay

## 2018-09-25 DIAGNOSIS — S72001A Fracture of unspecified part of neck of right femur, initial encounter for closed fracture: Secondary | ICD-10-CM

## 2018-09-25 NOTE — Progress Notes (Signed)
Post-Op Visit Note   Patient: Tasha Myers           Date of Birth: November 15, 1960           MRN: [id number] Visit Date: 09/25/2018 PCP: Ballard Russell, MD   Assessment & Plan:  Chief Complaint:  Chief Complaint  Patient presents with  . Right Hip - Routine Post Op   Visit Diagnoses:  1. Closed fracture of right hip, initial encounter [facility])     Plan: Leighana is now about 2 weeks out right intramedullary nail for hip screw.  She had a hip fracture.  She is doing well weightbearing as tolerated with right intramedullary hip screw placed for proximal femur fracture.  She is in pain management.  On exam she is walking well with a walker.  Does have a little bit of hip flexion weakness on the right which is not unexpected.  Incisions look good.  Radiographs also look good.  Plan is to continue with weightbearing as tolerated on that right side with a walker.  Come back in 4 weeks repeat radiographs and likely release at that time.  Follow-Up Instructions: No follow-ups on file.   Orders:  Orders Placed This Encounter  Procedures  . XR FEMUR, MIN 2 VIEWS RIGHT   No orders of the defined types were placed in this encounter.   Imaging: Xr Femur, Min 2 Views Right  Result Date: 09/25/2018 AP lateral right femur reviewed.  Intramedullary hip screw in good position alignment.  Lesser trochanter has displaced about 1/2 cm.  No evidence of hardware complication or fracture position change.   PMFS History: Patient Active Problem List   Diagnosis Date Noted  . Closed right hip fracture ([place]) 09/10/2018  . Mediastinal lymphadenopathy 09/10/2018  . Hip fracture ([place]) 09/10/2018  . ILD (interstitial lung disease) ([place]) 06/05/2018  . CKD (chronic kidney disease), stage III ([place])   . Essential hypertension   . Lung nodule, multiple 11/05/2014  . HLD (hyperlipidemia)   . Chronic combined systolic and diastolic CHF (congestive heart failure) ([place])   . CAD (coronary artery disease)  10/25/2010  . Hyperlipemia 10/25/2010  . Diabetes mellitus ([place]) 10/25/2010   Past Medical History:  Diagnosis Date  . CAD S/P percutaneous coronary angioplasty    a. s/p BMS stent to RCA ~2000; b. PTCA of ISR in 2001; c. 09/2013 Cath: LM nl, LAD 84m, LCX min irregs, RI min irregs, RCA 20p ISR.  Marland Kitchen Chronic combined systolic and diastolic CHF (congestive heart failure) ([place])    a. 09/2013 Echo: EF 45-50%, no rwma, Gr2 DD, mild AI, mildly dil LA.  . CKD (chronic kidney disease), stage III ([place])   . COPD (chronic obstructive pulmonary disease) ([place])   . Essential hypertension   . HLD (hyperlipidemia)   . Insomnia   . MI (myocardial infarction) ([place])   . Thoracic or lumbosacral neuritis or radiculitis, unspecified   . Type II diabetes mellitus (H[facility])     Family History  Problem Relation Age of Onset  . Coronary artery disease Mother   . Hypertension Mother   . Stroke Mother   . Diabetes Father   . Stroke Other        sibling  . Coronary artery disease Other        sibling  . Diabetes Other        sibling  . Hypertension Other        sibling  . Breast cancer Neg Hx  Past Surgical History:  Procedure Laterality Date  . BREAST BIOPSY Right 2014   neg x 3 areas  . BREAST EXCISIONAL BIOPSY Left YRS AGO   NEG  . CARPAL TUNNEL RELEASE     BILATERAL  . CORONARY ARTERY BYPASS GRAFT    . INTRAMEDULLARY (IM) NAIL INTERTROCHANTERIC Right 09/10/2018   Procedure: INTRAMEDULLARY (IM) NAIL INTERTROCHANTRIC;  Surgeon: Meredith Pel, MD;  Location: [place];  Service: Orthopedics;  Laterality: Right;  . LEFT HEART CATHETERIZATION WITH CORONARY ANGIOGRAM N/A 09/28/2013   Procedure: LEFT HEART CATHETERIZATION WITH CORONARY ANGIOGRAM;  Surgeon: Leonie Man, MD;  Location[facility] CATH LAB;  Service: Cardiovascular;  Laterality: N/A;  . LYMPH NODE RESECTION    . PARTIAL HYSTERECTOMY     Social History   Occupational History  . Not on file  Tobacco Use  . Smoking status: Current Every Day  Smoker    Packs/day: 1.00    Years: 40.00    Pack years: 40.00    Types: Cigarettes  . Smokeless tobacco: Never Used  Substance and Sexual Activity  . Alcohol use: Yes    Comment: occasionally  . Drug use: No  . Sexual activity: Not on file

## 2018-10-13 ENCOUNTER — Encounter: Payer: Self-pay | Admitting: Orthopedic Surgery

## 2018-10-13 ENCOUNTER — Other Ambulatory Visit: Payer: Self-pay

## 2018-10-13 ENCOUNTER — Ambulatory Visit (INDEPENDENT_AMBULATORY_CARE_PROVIDER_SITE_OTHER): Payer: Medicare Other | Admitting: Orthopedic Surgery

## 2018-10-13 ENCOUNTER — Ambulatory Visit (INDEPENDENT_AMBULATORY_CARE_PROVIDER_SITE_OTHER): Payer: Medicare Other

## 2018-10-13 VITALS — Ht 66.0 in | Wt 166.0 lb

## 2018-10-13 DIAGNOSIS — S72001A Fracture of unspecified part of neck of right femur, initial encounter for closed fracture: Secondary | ICD-10-CM

## 2018-10-15 ENCOUNTER — Encounter: Payer: Self-pay | Admitting: Orthopedic Surgery

## 2018-10-15 NOTE — Progress Notes (Signed)
Post-Op Visit Note   Patient: Tasha Myers           Date of Birth: 1961-04-09           MRN: [id number] Visit Date: 10/13/2018 PCP: Ballard Russell, MD   Assessment & Plan:  Chief Complaint:  Chief Complaint  Patient presents with   Right Hip - Follow-up   Visit Diagnoses:  1. Closed fracture of right hip, initial encounter [facility])     Plan: [name] is now month out right hip intramedullary hip screw for inotrope fracture.  This was a high inotrope fracture.  She has been doing well.  On exam she has antalgic gait to the right with a little bit of hip flexion strength loss.  She has full weightbearing.  Incisions are intact.  Radiographs look good.  Plan at this time is to continue with weightbearing as tolerated and ambulation as tolerated.  In general the hip fracture looks like it is healing well.  I will see her back as needed.  She is back on her baseline amount of chronic pain medicine.  Follow-Up Instructions: No follow-ups on file.   Orders:  Orders Placed This Encounter  Procedures   XR FEMUR, MIN 2 VIEWS RIGHT   No orders of the defined types were placed in this encounter.   Imaging: No results found.  PMFS History: Patient Active Problem List   Diagnosis Date Noted   Closed right hip fracture ([place]) 09/10/2018   Mediastinal lymphadenopathy 09/10/2018   Hip fracture ([place]) 09/10/2018   ILD (interstitial lung disease) ([place]) 06/05/2018   CKD (chronic kidney disease), stage III ([place])    Essential hypertension    Lung nodule, multiple 11/05/2014   HLD (hyperlipidemia)    Chronic combined systolic and diastolic CHF (congestive heart failure) ([place])    CAD (coronary artery disease) 10/25/2010   Hyperlipemia 10/25/2010   Diabetes mellitus ([place]) 10/25/2010   Past Medical History:  Diagnosis Date   CAD S/P percutaneous coronary angioplasty    a. s/p BMS stent to RCA ~2000; b. PTCA of ISR in 2001; c. 09/2013 Cath: LM nl, LAD 70m, LCX min  irregs, RI min irregs, RCA 20p ISR.   Chronic combined systolic and diastolic CHF (congestive heart failure) ([place])    a. 09/2013 Echo: EF 45-50%, no rwma, Gr2 DD, mild AI, mildly dil LA.   CKD (chronic kidney disease), stage III (H[facility])    COPD (chronic obstructive pulmonary disease) (H[facility])    Essential hypertension    HLD (hyperlipidemia)    Insomnia    MI (myocardial infarction) ([place])    Thoracic or lumbosacral neuritis or radiculitis, unspecified    Type II diabetes mellitus ([place])     Family History  Problem Relation Age of Onset   Coronary artery disease Mother    Hypertension Mother    Stroke Mother    Diabetes Father    Stroke Other        sibling   Coronary artery disease Other        sibling   Diabetes Other        sibling   Hypertension Other        sibling   Breast cancer Neg Hx     Past Surgical History:  Procedure Laterality Date   BREAST BIOPSY Right 2014   neg x 3 areas   BREAST EXCISIONAL BIOPSY Left YRS AGO   NEG   CARPAL TUNNEL RELEASE     BILATERAL   CORONARY  ARTERY BYPASS GRAFT     INTRAMEDULLARY (IM) NAIL INTERTROCHANTERIC Right 09/10/2018   Procedure: INTRAMEDULLARY (IM) NAIL INTERTROCHANTRIC;  Surgeon: Meredith Pel, MD;  Location: [place];  Service: Orthopedics;  Laterality: Right;   LEFT HEART CATHETERIZATION WITH CORONARY ANGIOGRAM N/A 09/28/2013   Procedure: LEFT HEART CATHETERIZATION WITH CORONARY ANGIOGRAM;  Surgeon: Leonie Man, MD;  Location[facility] C[facility] LAB;  Service: Cardiovascular;  Laterality: N/A;   LYMPH NODE RESECTION     PARTIAL HYSTERECTOMY     Social History   Occupational History   Not on file  Tobacco Use   Smoking status: Current Every Day Smoker    Packs/day: 1.00    Years: 40.00    Pack years: 40.00    Types: Cigarettes   Smokeless tobacco: Never Used  Substance and Sexual Activity   Alcohol use: Yes    Comment: occasionally   Drug use: No   Sexual activity: Not on file

## 2018-10-21 ENCOUNTER — Other Ambulatory Visit (HOSPITAL_COMMUNITY): Payer: Self-pay | Admitting: Specialist

## 2018-10-21 ENCOUNTER — Other Ambulatory Visit: Payer: Self-pay | Admitting: Specialist

## 2018-10-21 DIAGNOSIS — J849 Interstitial pulmonary disease, unspecified: Secondary | ICD-10-CM

## 2018-10-28 ENCOUNTER — Inpatient Hospital Stay: Admission: RE | Admit: 2018-10-28 | Payer: Medicare Other | Source: Ambulatory Visit

## 2018-10-31 ENCOUNTER — Ambulatory Visit: Admit: 2018-10-31 | Payer: Medicare Other

## 2018-10-31 SURGERY — BRONCHOSCOPY, WITH EBUS
Anesthesia: General

## 2018-11-18 ENCOUNTER — Ambulatory Visit: Admission: RE | Admit: 2018-11-18 | Payer: Medicare Other | Source: Ambulatory Visit

## 2018-12-03 ENCOUNTER — Emergency Department
Admission: EM | Admit: 2018-12-03 | Discharge: 2018-12-03 | Discharge status: Against Medical Advice | Payer: Medicare Other | Attending: Emergency Medicine | Admitting: Emergency Medicine

## 2018-12-03 ENCOUNTER — Emergency Department: Payer: Medicare Other

## 2018-12-03 ENCOUNTER — Encounter: Payer: Self-pay | Admitting: Emergency Medicine

## 2018-12-03 ENCOUNTER — Other Ambulatory Visit: Payer: Self-pay

## 2018-12-03 ENCOUNTER — Encounter: Payer: Self-pay | Admitting: Orthopedic Surgery

## 2018-12-03 ENCOUNTER — Ambulatory Visit (INDEPENDENT_AMBULATORY_CARE_PROVIDER_SITE_OTHER): Payer: Medicare Other | Admitting: Orthopedic Surgery

## 2018-12-03 ENCOUNTER — Ambulatory Visit (INDEPENDENT_AMBULATORY_CARE_PROVIDER_SITE_OTHER): Payer: Medicare Other

## 2018-12-03 DIAGNOSIS — I200 Unstable angina: Secondary | ICD-10-CM | POA: Diagnosis not present

## 2018-12-03 DIAGNOSIS — S72001A Fracture of unspecified part of neck of right femur, initial encounter for closed fracture: Secondary | ICD-10-CM

## 2018-12-03 DIAGNOSIS — F17210 Nicotine dependence, cigarettes, uncomplicated: Secondary | ICD-10-CM | POA: Insufficient documentation

## 2018-12-03 DIAGNOSIS — E1122 Type 2 diabetes mellitus with diabetic chronic kidney disease: Secondary | ICD-10-CM | POA: Insufficient documentation

## 2018-12-03 DIAGNOSIS — I252 Old myocardial infarction: Secondary | ICD-10-CM | POA: Diagnosis not present

## 2018-12-03 DIAGNOSIS — N183 Chronic kidney disease, stage 3 (moderate): Secondary | ICD-10-CM | POA: Insufficient documentation

## 2018-12-03 DIAGNOSIS — I130 Hypertensive heart and chronic kidney disease with heart failure and stage 1 through stage 4 chronic kidney disease, or unspecified chronic kidney disease: Secondary | ICD-10-CM | POA: Diagnosis not present

## 2018-12-03 DIAGNOSIS — R079 Chest pain, unspecified: Secondary | ICD-10-CM | POA: Diagnosis present

## 2018-12-03 DIAGNOSIS — I5042 Chronic combined systolic (congestive) and diastolic (congestive) heart failure: Secondary | ICD-10-CM | POA: Insufficient documentation

## 2018-12-03 LAB — CBC
HCT: 40.5 % (ref 36.0–46.0)
Hemoglobin: 13.4 g/dL (ref 12.0–15.0)
MCH: 30.3 pg (ref 26.0–34.0)
MCHC: 33.1 g/dL (ref 30.0–36.0)
MCV: 91.6 fL (ref 80.0–100.0)
Platelets: 328 10*3/uL (ref 150–400)
RBC: 4.42 MIL/uL (ref 3.87–5.11)
RDW: 15.8 % — ABNORMAL HIGH (ref 11.5–15.5)
WBC: 7.3 10*3/uL (ref 4.0–10.5)
nRBC: 0 % (ref 0.0–0.2)

## 2018-12-03 LAB — BASIC METABOLIC PANEL
Anion gap: 8 (ref 5–15)
BUN: 14 mg/dL (ref 6–20)
CO2: 25 mmol/L (ref 22–32)
Calcium: 9.4 mg/dL (ref 8.9–10.3)
Chloride: 100 mmol/L (ref 98–111)
Creatinine, Ser: 1.19 mg/dL — ABNORMAL HIGH (ref 0.44–1.00)
GFR calc Af Amer: 57 mL/min — ABNORMAL LOW (ref >60)
GFR calc non Af Amer: 50 mL/min — ABNORMAL LOW (ref >60)
Glucose, Bld: 176 mg/dL — ABNORMAL HIGH (ref 70–99)
Potassium: 3.6 mmol/L (ref 3.5–5.1)
Sodium: 133 mmol/L — ABNORMAL LOW (ref 135–145)

## 2018-12-03 LAB — TROPONIN I (HIGH SENSITIVITY): Troponin I (High Sensitivity): 18 ng/L — ABNORMAL HIGH (ref <18)

## 2018-12-03 MED ORDER — SODIUM CHLORIDE 0.9% FLUSH: 3.0000 mL | Freq: Once | INTRAVENOUS | Status: DC

## 2018-12-03 MED ORDER — MORPHINE SULFATE (PF) 4 MG/ML IV SOLN
4.0000 mg | Freq: Once | INTRAVENOUS | Status: DC
  Filled 2018-12-03: qty 1

## 2018-12-03 MED ORDER — NITROGLYCERIN 0.4 MG SL SUBL
0.4000 mg | SUBLINGUAL_TABLET | Freq: Once | SUBLINGUAL | Status: AC
  Administered 2018-12-03: 0.4 mg via SUBLINGUAL
  Filled 2018-12-03: qty 1

## 2018-12-03 MED ORDER — ASPIRIN 81 MG PO CHEW
324.0000 mg | CHEWABLE_TABLET | Freq: Once | ORAL | Status: AC
  Administered 2018-12-03: 324 mg via ORAL
  Filled 2018-12-03: qty 4

## 2018-12-03 NOTE — ED Notes (Signed)
Upon going in to explain discharge papers, room found to be empty with no sign of patient returning.  EDP aware.

## 2018-12-03 NOTE — ED Triage Notes (Addendum)
Patient reports acute onset central chest pain that started approximately 2 hours ago. Reports associated SOB. Denies fevers or cough. Patient pale and diaphoretic on arrival to ED in obvious discomfort. History of MI 10 years ago. States she was taken off of Plavix 3 weeks ago due to "being on it too long"

## 2018-12-03 NOTE — ED Provider Notes (Signed)
[facility] Emergency Department Provider Note       Time seen: ----------------------------------------- 12:08 PM on 12/03/2018 -----------------------------------------   I have reviewed the triage vital signs and the nursing notes.  HISTORY   Chief Complaint Chest Pain and Shortness of Breath    HPI Tasha Myers is a 58 y.o. female with a history of coronary artery disease, CHF, chronic kidney disease, COPD, MI who presents to the ED for cute onset central chest pain that started approximately 2 hours ago.  She has associated shortness of breath, denies fevers or cough.  She arrives diaphoretic on arrival to the ER.  History of MI 10 years ago.  Past Medical History:  Diagnosis Date  . CAD S/P percutaneous coronary angioplasty    a. s/p BMS stent to RCA ~2000; b. PTCA of ISR in 2001; c. 09/2013 Cath: LM nl, LAD 72m, LCX min irregs, RI min irregs, RCA 20p ISR.  Marland Kitchen Chronic combined systolic and diastolic CHF (congestive heart failure) ([place])    a. 09/2013 Echo: EF 45-50%, no rwma, Gr2 DD, mild AI, mildly dil LA.  . CKD (chronic kidney disease), stage III ([place])   . COPD (chronic obstructive pulmonary disease) ([place])   . Essential hypertension   . HLD (hyperlipidemia)   . Insomnia   . MI (myocardial infarction) ([place])   . Thoracic or lumbosacral neuritis or radiculitis, unspecified   . Type II diabetes mellitus [facility])     Patient Active Problem List   Diagnosis Date Noted  . Closed right hip fracture ([place]) 09/10/2018  . Mediastinal lymphadenopathy 09/10/2018  . Hip fracture ([place]) 09/10/2018  . ILD (interstitial lung disease) ([place]) 06/05/2018  . CKD (chronic kidney disease), stage III ([place])   . Essential hypertension   . Lung nodule, multiple 11/05/2014  . HLD (hyperlipidemia)   . Chronic combined systolic and diastolic CHF (congestive heart failure) ([place])   . CAD (coronary artery disease) 10/25/2010  . Hyperlipemia 10/25/2010  . Diabetes mellitus  ([place]) 10/25/2010    Past Surgical History:  Procedure Laterality Date  . BREAST BIOPSY Right 2014   neg x 3 areas  . BREAST EXCISIONAL BIOPSY Left YRS AGO   NEG  . CARPAL TUNNEL RELEASE     BILATERAL  . CORONARY ARTERY BYPASS GRAFT    . INTRAMEDULLARY (IM) NAIL INTERTROCHANTERIC Right 09/10/2018   Procedure: INTRAMEDULLARY (IM) NAIL INTERTROCHANTRIC;  Surgeon: Meredith Pel, MD;  Location: [place];  Service: Orthopedics;  Laterality: Right;  . LEFT HEART CATHETERIZATION WITH CORONARY ANGIOGRAM N/A 09/28/2013   Procedure: LEFT HEART CATHETERIZATION WITH CORONARY ANGIOGRAM;  Surgeon: Leonie Man, MD;  Location[facility] C[facility];  Service: Cardiovascular;  Laterality: N/A;  . LYMPH NODE RESECTION    . PARTIAL HYSTERECTOMY      Allergies Empagliflozin  Social History Social History   Tobacco Use  . Smoking status: Current Every Day Smoker    Packs/day: 1.00    Years: 40.00    Pack years: 40.00    Types: Cigarettes  . Smokeless tobacco: Never Used  Substance Use Topics  . Alcohol use: Yes    Comment: occasionally  . Drug use: No    Review of Systems Constitutional: Negative for fever. Cardiovascular: Positive for chest pain Respiratory: Positive for shortness of breath Gastrointestinal: Negative for abdominal pain, vomiting and diarrhea. Musculoskeletal: Negative for back pain. Skin: Positive for diaphoresis Neurological: Negative for headaches, focal weakness or numbness.  All systems negative/normal/unremarkable except as stated in the HPI  ____________________________________________   PHYSICAL EXAM:  VITAL SIGNS: ED Triage Vitals  Enc Vitals Group     BP 12/03/18 1156 (!) 172/67     Pulse Rate 12/03/18 1156 86     Resp 12/03/18 1156 (!) 24     Temp 12/03/18 1156 98.4 F (36.9 C)     Temp Source 12/03/18 1156 Oral     SpO2 12/03/18 1156 99 %     Weight 12/03/18 1157 165 lb (74.8 kg)     Height 12/03/18 1157 5\' 6"  (1.676 m)     Head Circumference --       Peak Flow --      Pain Score 12/03/18 1157 10     Pain Loc --      Pain Edu? --      Excl. in [place]? --     Constitutional: Alert and oriented.  Mild distress from pain Eyes: Conjunctivae are normal. Normal extraocular movements. ENT      Head: Normocephalic and atraumatic.      Nose: No congestion/rhinnorhea.      Mouth/Throat: Mucous membranes are moist.      Neck: No stridor. Cardiovascular: Normal rate, regular rhythm. No murmurs, rubs, or gallops. Respiratory: Normal respiratory effort without tachypnea nor retractions. Breath sounds are clear and equal bilaterally. No wheezes/rales/rhonchi. Gastrointestinal: Soft and nontender. Normal bowel sounds Musculoskeletal: Nontender with normal range of motion in extremities. No lower extremity tenderness nor edema. Neurologic:  Normal speech and language. No gross focal neurologic deficits are appreciated.  Skin:  Skin is warm, dry and intact. No rash noted. Psychiatric: Mood and affect are normal. Speech and behavior are normal.  ____________________________________________  EKG: Interpreted by me.  Sinus rhythm with rate of 98 bpm, left bundle branch block, abnormal EKG, long QT Repeat EKG interpreted by me, sinus rhythm with a rate of 87 bpm, left bundle branch block, normal axis, long QT ____________________________________________  ED COURSE:  As part of my medical decision making, I reviewed the following data within the [place] History obtained from family if available, nursing notes, old chart and ekg, as well as notes from prior ED visits. Patient presented for chest pain, we will assess with labs and imaging as indicated at this time.   Procedures  Tasha Myers was evaluated in Emergency Department on 12/03/2018 for the symptoms described in the history of present illness. She was evaluated in the context of the global COVID-19 pandemic, which necessitated consideration that the patient might be at  risk for infection with the SARS-CoV-2 virus that causes COVID-19. Institutional protocols and algorithms that pertain to the evaluation of patients at risk for COVID-19 are in a state of rapid change based on information released by regulatory bodies including the CDC and federal and state organizations. These policies and algorithms were followed during the patient's care in the ED.  ____________________________________________   LABS (pertinent positives/negatives)  Labs Reviewed  BASIC METABOLIC PANEL - Abnormal; Notable for the following components:      Result Value   Sodium 133 (*)    Glucose, Bld 176 (*)    Creatinine, Ser 1.19 (*)    GFR calc non Af Amer 50 (*)    GFR calc Af Amer 57 (*)    All other components within normal limits  CBC - Abnormal; Notable for the following components:   RDW 15.8 (*)    All other components within normal limits  TROPONIN I (HIGH SENSITIVITY) - Abnormal; Notable for  the following components:   Troponin I (High Sensitivity) 18 (*)    All other components within normal limits  TROPONIN I (HIGH SENSITIVITY)    RADIOLOGY Images were viewed by me  Chest x-ray IMPRESSION: Stable areas of fibrosis and interstitial thickening. No frank edema or consolidation. Mild cardiac enlargement. No adenopathy.  ____________________________________________   DIFFERENTIAL DIAGNOSIS   Unstable angina, MI, PE, dissection, pneumothorax  FINAL ASSESSMENT AND PLAN  Chest pain, unstable angina, elevated troponin   Plan: The patient had presented for severe chest pain. Patient's labs did reveal an elevated troponin with a level of 18. Patient's imaging was negative for any acute process.  I am concerned that her symptoms do resemble unstable angina and I cannot ensure her safety.  I have advised her that she needs to be admitted and formally ruled out with stress testing or heart catheterization.  I cannot convince her to stay in the hospital.  She is leaving  [place].   Laurence Aly, MD    Note: This note was generated in part or whole with voice recognition software. Voice recognition is usually quite accurate but there are transcription errors that can and very often do occur. I apologize for any typographical errors that were not detected and corrected.     Earleen Newport, MD 12/03/18 1330

## 2018-12-05 ENCOUNTER — Encounter: Payer: Self-pay | Admitting: Orthopedic Surgery

## 2018-12-05 NOTE — Progress Notes (Signed)
Post-Op Visit Note   Patient: Tasha Myers           Date of Birth: Jul 17, 1960           MRN: [id number] Visit Date: 12/03/2018 PCP: Ballard Russell, MD   Assessment & Plan:  Chief Complaint:  Chief Complaint  Patient presents with  . Right Hip - Pain   Visit Diagnoses:  1. Closed fracture of right hip, initial encounter [facility])     Plan: Tasha Myers is a patient is now about 3 months out right intertrochanteric fracture fixation.  She was doing exceedingly well after her surgery now she is having some increasing pain.  She has a little bit of an area where a suture trying to work itself out from the proximal incision mild groin pain with internal X rotation of the leg and antalgic gait more pronounced now than that the last visit.  Radiographs may or may not show a nonunion.  No evidence of hardware loosening.  She needs a CT scan of the proximal right femur to evaluate for possible nonunion of the fracture.  We will see her back after that study  Follow-Up Instructions: Return for After CT scan.   Orders:  Orders Placed This Encounter  Procedures  . XR FEMUR, MIN 2 VIEWS RIGHT  . CT FEMUR RIGHT WO CONTRAST   No orders of the defined types were placed in this encounter.   Imaging: No results found.  PMFS History: Patient Active Problem List   Diagnosis Date Noted  . Closed right hip fracture ([place]) 09/10/2018  . Mediastinal lymphadenopathy 09/10/2018  . Hip fracture ([place]) 09/10/2018  . ILD (interstitial lung disease) ([place]) 06/05/2018  . CKD (chronic kidney disease), stage III ([place])   . Essential hypertension   . Lung nodule, multiple 11/05/2014  . HLD (hyperlipidemia)   . Chronic combined systolic and diastolic CHF (congestive heart failure) ([place])   . CAD (coronary artery disease) 10/25/2010  . Hyperlipemia 10/25/2010  . Diabetes mellitus ([place]) 10/25/2010   Past Medical History:  Diagnosis Date  . CAD S/P percutaneous coronary angioplasty    a. s/p BMS stent  to RCA ~2000; b. PTCA of ISR in 2001; c. 09/2013 Cath: LM nl, LAD 20m, LCX min irregs, RI min irregs, RCA 20p ISR.  Marland Kitchen Chronic combined systolic and diastolic CHF (congestive heart failure) ([place])    a. 09/2013 Echo: EF 45-50%, no rwma, Gr2 DD, mild AI, mildly dil LA.  . CKD (chronic kidney disease), stage III ([place])   . COPD (chronic obstructive pulmonary disease) ([place])   . Essential hypertension   . HLD (hyperlipidemia)   . Insomnia   . MI (myocardial infarction) ([place])   . Thoracic or lumbosacral neuritis or radiculitis, unspecified   . Type II diabetes mellitus ([facility])     Family History  Problem Relation Age of Onset  . Coronary artery disease Mother   . Hypertension Mother   . Stroke Mother   . Diabetes Father   . Stroke Other        sibling  . Coronary artery disease Other        sibling  . Diabetes Other        sibling  . Hypertension Other        sibling  . Breast cancer Neg Hx     Past Surgical History:  Procedure Laterality Date  . BREAST BIOPSY Right 2014   neg x 3 areas  . BREAST EXCISIONAL BIOPSY Left YRS  AGO   NEG  . CARPAL TUNNEL RELEASE     BILATERAL  . CORONARY ARTERY BYPASS GRAFT    . INTRAMEDULLARY (IM) NAIL INTERTROCHANTERIC Right 09/10/2018   Procedure: INTRAMEDULLARY (IM) NAIL INTERTROCHANTRIC;  Surgeon: Meredith Pel, MD;  Location: [place];  Service: Orthopedics;  Laterality: Right;  . LEFT HEART CATHETERIZATION WITH CORONARY ANGIOGRAM N/A 09/28/2013   Procedure: LEFT HEART CATHETERIZATION WITH CORONARY ANGIOGRAM;  Surgeon: Leonie Man, MD;  Location: [facility] CATH LAB;  Service: Cardiovascular;  Laterality: N/A;  . LYMPH NODE RESECTION    . PARTIAL HYSTERECTOMY     Social History   Occupational History  . Not on file  Tobacco Use  . Smoking status: Current Every Day Smoker    Packs/day: 1.00    Years: 40.00    Pack years: 40.00    Types: Cigarettes  . Smokeless tobacco: Never Used  Substance and Sexual Activity  . Alcohol use: Yes    Comment:  occasionally  . Drug use: No  . Sexual activity: Not on file

## 2018-12-12 ENCOUNTER — Encounter: Payer: Self-pay | Admitting: *Deleted

## 2019-03-07 ENCOUNTER — Other Ambulatory Visit: Payer: Self-pay

## 2019-03-07 ENCOUNTER — Emergency Department: Payer: Medicare Other

## 2019-03-07 ENCOUNTER — Emergency Department
Admission: EM | Admit: 2019-03-07 | Discharge: 2019-03-07 | Discharge status: Against Medical Advice | Payer: Medicare Other | Attending: Emergency Medicine | Admitting: Emergency Medicine

## 2019-03-07 DIAGNOSIS — N183 Chronic kidney disease, stage 3 unspecified: Secondary | ICD-10-CM | POA: Insufficient documentation

## 2019-03-07 DIAGNOSIS — I130 Hypertensive heart and chronic kidney disease with heart failure and stage 1 through stage 4 chronic kidney disease, or unspecified chronic kidney disease: Secondary | ICD-10-CM | POA: Diagnosis not present

## 2019-03-07 DIAGNOSIS — F17210 Nicotine dependence, cigarettes, uncomplicated: Secondary | ICD-10-CM | POA: Diagnosis not present

## 2019-03-07 DIAGNOSIS — Z5329 Procedure and treatment not carried out because of patient's decision for other reasons: Secondary | ICD-10-CM | POA: Insufficient documentation

## 2019-03-07 DIAGNOSIS — R079 Chest pain, unspecified: Secondary | ICD-10-CM | POA: Diagnosis not present

## 2019-03-07 DIAGNOSIS — Z79899 Other long term (current) drug therapy: Secondary | ICD-10-CM | POA: Insufficient documentation

## 2019-03-07 DIAGNOSIS — I5042 Chronic combined systolic (congestive) and diastolic (congestive) heart failure: Secondary | ICD-10-CM | POA: Insufficient documentation

## 2019-03-07 DIAGNOSIS — K219 Gastro-esophageal reflux disease without esophagitis: Secondary | ICD-10-CM | POA: Diagnosis not present

## 2019-03-07 DIAGNOSIS — Z7984 Long term (current) use of oral hypoglycemic drugs: Secondary | ICD-10-CM | POA: Diagnosis not present

## 2019-03-07 DIAGNOSIS — E119 Type 2 diabetes mellitus without complications: Secondary | ICD-10-CM | POA: Diagnosis not present

## 2019-03-07 DIAGNOSIS — Z20828 Contact with and (suspected) exposure to other viral communicable diseases: Secondary | ICD-10-CM | POA: Diagnosis not present

## 2019-03-07 DIAGNOSIS — Z7982 Long term (current) use of aspirin: Secondary | ICD-10-CM | POA: Insufficient documentation

## 2019-03-07 LAB — CBC WITH DIFFERENTIAL/PLATELET
Abs Immature Granulocytes: 0.05 10*3/uL (ref 0.00–0.07)
Basophils Absolute: 0.1 10*3/uL (ref 0.0–0.1)
Basophils Relative: 1 %
Eosinophils Absolute: 0.1 10*3/uL (ref 0.0–0.5)
Eosinophils Relative: 1 %
HCT: 39.1 % (ref 36.0–46.0)
Hemoglobin: 13.4 g/dL (ref 12.0–15.0)
Immature Granulocytes: 1 %
Lymphocytes Relative: 20 %
Lymphs Abs: 2 10*3/uL (ref 0.7–4.0)
MCH: 30.1 pg (ref 26.0–34.0)
MCHC: 34.3 g/dL (ref 30.0–36.0)
MCV: 87.9 fL (ref 80.0–100.0)
Monocytes Absolute: 0.7 10*3/uL (ref 0.1–1.0)
Monocytes Relative: 7 %
Neutro Abs: 7.4 10*3/uL (ref 1.7–7.7)
Neutrophils Relative %: 70 %
Platelets: 281 10*3/uL (ref 150–400)
RBC: 4.45 MIL/uL (ref 3.87–5.11)
RDW: 17.9 % — ABNORMAL HIGH (ref 11.5–15.5)
WBC: 10.3 10*3/uL (ref 4.0–10.5)
nRBC: 0 % (ref 0.0–0.2)

## 2019-03-07 LAB — COMPREHENSIVE METABOLIC PANEL
ALT: 10 U/L (ref 0–44)
AST: 15 U/L (ref 15–41)
Albumin: 3.7 g/dL (ref 3.5–5.0)
Alkaline Phosphatase: 91 U/L (ref 38–126)
Anion gap: 11 (ref 5–15)
BUN: 11 mg/dL (ref 6–20)
CO2: 26 mmol/L (ref 22–32)
Calcium: 9.1 mg/dL (ref 8.9–10.3)
Chloride: 102 mmol/L (ref 98–111)
Creatinine, Ser: 1.09 mg/dL — ABNORMAL HIGH (ref 0.44–1.00)
GFR calc Af Amer: >60 mL/min (ref >60)
GFR calc non Af Amer: 55 mL/min — ABNORMAL LOW (ref >60)
Glucose, Bld: 126 mg/dL — ABNORMAL HIGH (ref 70–99)
Potassium: 3.2 mmol/L — ABNORMAL LOW (ref 3.5–5.1)
Sodium: 139 mmol/L (ref 135–145)
Total Bilirubin: 0.8 mg/dL (ref 0.3–1.2)
Total Protein: 7.6 g/dL (ref 6.5–8.1)

## 2019-03-07 LAB — LIPASE, BLOOD: Lipase: 29 U/L (ref 11–51)

## 2019-03-07 LAB — SARS CORONAVIRUS 2 (TAT 6-24 HRS): SARS Coronavirus 2: NEGATIVE

## 2019-03-07 LAB — TROPONIN I (HIGH SENSITIVITY)
Troponin I (High Sensitivity): 16 ng/L (ref <18)
Troponin I (High Sensitivity): 16 ng/L (ref <18)

## 2019-03-07 MED ORDER — POTASSIUM CHLORIDE CRYS ER 20 MEQ PO TBCR
40.0000 meq | EXTENDED_RELEASE_TABLET | Freq: Once | ORAL | Status: AC
  Administered 2019-03-07: 40 meq via ORAL
  Filled 2019-03-07: qty 2

## 2019-03-07 MED ORDER — FAMOTIDINE IN NACL 20-0.9 MG/50ML-% IV SOLN
20.0000 mg | Freq: Once | INTRAVENOUS | Status: AC
  Administered 2019-03-07: 06:00:00 20 mg via INTRAVENOUS
  Filled 2019-03-07: qty 50

## 2019-03-07 MED ORDER — NITROGLYCERIN 0.4 MG SL SUBL
0.4000 mg | SUBLINGUAL_TABLET | SUBLINGUAL | Status: DC | PRN
  Administered 2019-03-07: 0.4 mg via SUBLINGUAL
  Filled 2019-03-07: qty 1

## 2019-03-07 NOTE — Discharge Instructions (Signed)
1.  Continue your daily medications as directed by your doctor. 2.  Return to the ER for return or worsening symptoms, persistent vomiting, difficulty breathing or other concerns.

## 2019-03-07 NOTE — ED Notes (Signed)
Pt requesting to leave, states ride will be here to transport home in 10 mins. Beather Arbour MD made aware. IV removed per pt request. 2nd Troponin sent to lab per order. Will have patient sign out AMA per Beather Arbour MD.

## 2019-03-07 NOTE — ED Triage Notes (Signed)
Pt arrive via [facility]. Pt states CP started last night at 9pm. Pt took 2x 81mg  ASA at 3:30am. Pt has hx of MI with stent placement. EMS gave 2x 81mg  ASA and 1 spray nitro. CBG 117.  Upon arrival pt breathing even and unlabored. Pt in NAD.

## 2019-03-07 NOTE — ED Provider Notes (Signed)
[facility] Emergency Department Provider Note   ____________________________________________   First MD Initiated Contact with Patient 03/07/19 0530     (approximate)  I have reviewed the triage vital signs and the nursing notes.   HISTORY  Chief Complaint Chest Pain    HPI Tasha Myers is a 58 y.o. female brought to the ED via EMS from home with a chief complaint of chest pain.  Patient has a history of CAD status post stents, CHF, CKD, COPD who experienced left-sided chest pressure approximately 9 PM, waxing/waning.  Symptoms associated with mild shortness of breath and nausea.  Patient states it radiated to her entire chest and a burning fashion.  Denies associated diaphoresis, nausea/vomiting, palpitations or dizziness.  Denies recent fever, cough, diarrhea.    Took 2 baby aspirin at approximately 3:30 AM.    Past Medical History:  Diagnosis Date  . CAD S/P percutaneous coronary angioplasty    a. s/p BMS stent to RCA ~2000; b. PTCA of ISR in 2001; c. 09/2013 Cath: LM nl, LAD 32m, LCX min irregs, RI min irregs, RCA 20p ISR.  Marland Kitchen Chronic combined systolic and diastolic CHF (congestive heart failure) ([place])    a. 09/2013 Echo: EF 45-50%, no rwma, Gr2 DD, mild AI, mildly dil LA.  . CKD (chronic kidney disease), stage III   . COPD (chronic obstructive pulmonary disease) ([place])   . Essential hypertension   . HLD (hyperlipidemia)   . Insomnia   . MI (myocardial infarction) ([place])   . Thoracic or lumbosacral neuritis or radiculitis, unspecified   . Type II diabetes mellitus [facility])     Patient Active Problem List   Diagnosis Date Noted  . Closed right hip fracture ([place]) 09/10/2018  . Mediastinal lymphadenopathy 09/10/2018  . Hip fracture ([place]) 09/10/2018  . ILD (interstitial lung disease) ([place]) 06/05/2018  . CKD (chronic kidney disease), stage III ([place])   . Essential hypertension   . Lung nodule, multiple 11/05/2014  . HLD (hyperlipidemia)   . Chronic  combined systolic and diastolic CHF (congestive heart failure) ([place])   . CAD (coronary artery disease) 10/25/2010  . Hyperlipemia 10/25/2010  . Diabetes mellitus ([place]) 10/25/2010    Past Surgical History:  Procedure Laterality Date  . BREAST BIOPSY Right 2014   neg x 3 areas  . BREAST EXCISIONAL BIOPSY Left YRS AGO   NEG  . CARPAL TUNNEL RELEASE     BILATERAL  . CORONARY ARTERY BYPASS GRAFT    . INTRAMEDULLARY (IM) NAIL INTERTROCHANTERIC Right 09/10/2018   Procedure: INTRAMEDULLARY (IM) NAIL INTERTROCHANTRIC;  Surgeon: Meredith Pel, MD;  Location: [place];  Service: Orthopedics;  Laterality: Right;  . LEFT HEART CATHETERIZATION WITH CORONARY ANGIOGRAM N/A 09/28/2013   Procedure: LEFT HEART CATHETERIZATION WITH CORONARY ANGIOGRAM;  Surgeon: Leonie Man, MD;  Location[facility] CATH LAB;  Service: Cardiovascular;  Laterality: N/A;  . LYMPH NODE RESECTION    . PARTIAL HYSTERECTOMY      Prior to Admission medications   Medication Sig Start Date End Date Taking? Authorizing Provider  amLODipine (NORVASC) 2.5 MG tablet Take 2.5 mg by mouth daily. 09/04/18   [provider]  aspirin EC 81 MG EC tablet Take 1 tablet (81 mg total) by mouth daily. Patient not taking: Reported on 10/21/2018 09/11/18   Velna Ochs, MD  carvedilol (COREG) 12.5 MG tablet Take 12.5 mg by mouth 2 (two) times daily with a meal.     [provider]  cholecalciferol (VITAMIN D3) 25 MCG (  1000 UT) tablet Take 1,000 Units by mouth 2 (two) times a day.    [provider]  clopidogrel (PLAVIX) 75 MG tablet TAKE 1 TABLET BY MOUTH ONCE DAILY. Patient taking differently: Take 75 mg by mouth daily.  07/10/17   Minna Merritts, MD  furosemide (LASIX) 40 MG tablet Take 60 mg by mouth daily.     [provider]  gabapentin (NEURONTIN) 300 MG capsule Take 300 mg by mouth 2 (two) times daily.  09/05/18 09/05/19  [provider]  glipiZIDE (GLUCOTROL XL) 5 MG 24 hr tablet Take 10 mg by  mouth daily.     [provider]  isosorbide mononitrate (IMDUR) 30 MG 24 hr tablet Take 30 mg by mouth daily.    [provider]  nitroGLYCERIN (NITROSTAT) 0.4 MG SL tablet Place 0.4 mg under the tongue every 5 (five) minutes as needed for chest pain.     [provider]  oxyCODONE (ROXICODONE) 15 MG immediate release tablet Take 15 mg by mouth 3 (three) times daily as needed for pain.  08/15/18   [provider]  pantoprazole (PROTONIX) 20 MG tablet Take 20 mg by mouth daily.  06/17/17 10/21/18  [provider]  rosuvastatin (CRESTOR) 20 MG tablet Take 40 mg by mouth daily.  09/04/17   [provider]  sitaGLIPtin (JANUVIA) 50 MG tablet Take 50 mg by mouth daily.  10/15/16 10/21/18  [provider]    Allergies Empagliflozin  Family History  Problem Relation Age of Onset  . Coronary artery disease Mother   . Hypertension Mother   . Stroke Mother   . Diabetes Father   . Stroke Other        sibling  . Coronary artery disease Other        sibling  . Diabetes Other        sibling  . Hypertension Other        sibling  . Breast cancer Neg Hx     Social History Social History   Tobacco Use  . Smoking status: Current Every Day Smoker    Packs/day: 1.00    Years: 40.00    Pack years: 40.00    Types: Cigarettes  . Smokeless tobacco: Never Used  Substance Use Topics  . Alcohol use: Yes    Comment: occasionally  . Drug use: No    Review of Systems  Constitutional: No fever/chills Eyes: No visual changes. ENT: No sore throat. Cardiovascular: Positive for chest pain. Respiratory: Positive for shortness of breath. Gastrointestinal: No abdominal pain.  No nausea, no vomiting.  No diarrhea.  No constipation. Genitourinary: Negative for dysuria. Musculoskeletal: Negative for back pain. Skin: Negative for rash. Neurological: Negative for headaches, focal weakness or  numbness.   ____________________________________________   PHYSICAL EXAM:  VITAL SIGNS: ED Triage Vitals  Enc Vitals Group     BP      Pulse      Resp      Temp      Temp src      SpO2      Weight      Height      Head Circumference      Peak Flow      Pain Score      Pain Loc      Pain Edu?      Excl. in [place]?     Constitutional: Alert and oriented. Well appearing and in mild acute distress. Eyes: Conjunctivae are normal. PERRL.  EOMI. Head: Atraumatic. Nose: No congestion/rhinnorhea. Mouth/Throat: Mucous membranes are moist.  Oropharynx non-erythematous. Neck: No stridor.   Cardiovascular: Normal rate, regular rhythm. Grossly normal heart sounds.  Good peripheral circulation. Respiratory: Normal respiratory effort.  No retractions. Lungs CTAB. Gastrointestinal: Soft and nontender. No distention. No abdominal bruits. No CVA tenderness. Musculoskeletal: No lower extremity tenderness nor edema.  No joint effusions. Neurologic:  Normal speech and language. No gross focal neurologic deficits are appreciated.  Skin:  Skin is warm, dry and intact. No rash noted. Psychiatric: Mood and affect are normal. Speech and behavior are normal.  ____________________________________________   LABS (all labs ordered are listed, but only abnormal results are displayed)  Labs Reviewed  COMPREHENSIVE METABOLIC PANEL - Abnormal; Notable for the following components:      Result Value   Potassium 3.2 (*)    Glucose, Bld 126 (*)    Creatinine, Ser 1.09 (*)    GFR calc non Af Amer 55 (*)    All other components within normal limits  CBC WITH DIFFERENTIAL/PLATELET - Abnormal; Notable for the following components:   RDW 17.9 (*)    All other components within normal limits  SARS CORONAVIRUS 2 (TAT 6-24 HRS)  LIPASE, BLOOD  TROPONIN I (HIGH SENSITIVITY)  TROPONIN I (HIGH SENSITIVITY)   ____________________________________________  EKG  ED ECG REPORT I, SUNG,JADE J, the attending  physician, personally viewed and interpreted this ECG.   Date: 03/07/2019  EKG Time: 0534  Rate: 73  Rhythm: normal EKG, normal sinus rhythm  Axis: LBBB  Intervals:none  ST&T Change: Nonspecific No change from August 2020  ____________________________________________  RADIOLOGY  ED MD interpretation: No acute cardiopulmonary process  Official radiology report(s): Dg Chest Portable 1 View  Result Date: 03/07/2019 CLINICAL DATA:  Chest pain EXAM: PORTABLE CHEST 1 VIEW COMPARISON:  12/03/2018 FINDINGS: Coarse interstitial opacities on both sides, stable and interstitial lung disease by a 2016 chest CT. Borderline heart size, stable. There is no [employer], consolidation, effusion, or pneumothorax. IMPRESSION: Stable exam. Chronic lung disease without acute superimposed finding. Electronically Signed   By: Monte Fantasia M.D.   On: 03/07/2019 06:07    ____________________________________________   PROCEDURES  Procedure(s) performed (including Critical Care):  Procedures   ____________________________________________   INITIAL IMPRESSION / ASSESSMENT AND PLAN / ED COURSE  As part of my medical decision making, I reviewed the following data within the [place] notes reviewed and incorporated, Labs reviewed, EKG interpreted, Old chart reviewed, Radiograph reviewed and Notes from prior ED visits     Tasha Myers was evaluated in Emergency Department on 03/07/2019 for the symptoms described in the history of present illness. She was evaluated in the context of the global COVID-19 pandemic, which necessitated consideration that the patient might be at risk for infection with the SARS-CoV-2 virus that causes COVID-19. Institutional protocols and algorithms that pertain to the evaluation of patients at risk for COVID-19 are in a state of rapid change based on information released by regulatory bodies including the CDC and federal and state  organizations. These policies and algorithms were followed during the patient's care in the ED.    58 year old female with CAD status post stent who presents for chest pain. Differential diagnosis includes, but is not limited to, ACS, aortic dissection, pulmonary embolism, cardiac tamponade, pneumothorax, pneumonia, pericarditis, myocarditis, GI-related causes including esophagitis/gastritis, and musculoskeletal chest wall pain.    Patient received 2 baby aspirin and 1 nitroglycerin spray per EMS prior to arrival.  No significant change in pain.  EKG demonstrates left bundle branch block unchanged since August 2020.  Will administer nitroglycerin, check cardiac panel, chest x-ray.  Anticipate hospitalization for unstable angina.   Clinical Course as of Mar 06 713  Sat Mar 07, 2019  0655 Patient is feeling much better, is pain-free and desires to be discharged home.  I have asked her to stay for her second troponin which she agrees to.  If that is unchanged, anticipate she may safely be discharged home with follow-up with her PCP.  Care will be transferred to the oncoming provider pending repeat troponin.   [JS]    Clinical Course User Index [JS] Paulette Blanch, MD     ____________________________________________   FINAL CLINICAL IMPRESSION(S) / ED DIAGNOSES  Final diagnoses:  Nonspecific chest pain  Gastroesophageal reflux disease, unspecified whether esophagitis present     ED Discharge Orders    None       Note:  This document was prepared using Dragon voice recognition software and may include unintentional dictation errors.   Paulette Blanch, MD 03/07/19 [phone]

## 2019-03-07 NOTE — ED Notes (Signed)
Pt ambulated out of department to lobby independently. Denies chest pain at this time.

## 2019-10-08 ENCOUNTER — Ambulatory Visit: Payer: Medicare Other | Admitting: Orthopedic Surgery

## 2019-10-12 ENCOUNTER — Emergency Department (HOSPITAL_COMMUNITY): Payer: Medicare Other

## 2019-10-12 ENCOUNTER — Ambulatory Visit: Payer: Medicare Other | Admitting: Orthopedic Surgery

## 2019-10-12 ENCOUNTER — Emergency Department (HOSPITAL_COMMUNITY)
Admission: EM | Admit: 2019-10-12 | Discharge: 2019-10-12 | Discharge status: Against Medical Advice | Payer: Medicare Other | Attending: Emergency Medicine | Admitting: Emergency Medicine

## 2019-10-12 ENCOUNTER — Other Ambulatory Visit: Payer: Self-pay

## 2019-10-12 DIAGNOSIS — Z7984 Long term (current) use of oral hypoglycemic drugs: Secondary | ICD-10-CM | POA: Insufficient documentation

## 2019-10-12 DIAGNOSIS — Z79899 Other long term (current) drug therapy: Secondary | ICD-10-CM | POA: Insufficient documentation

## 2019-10-12 DIAGNOSIS — I130 Hypertensive heart and chronic kidney disease with heart failure and stage 1 through stage 4 chronic kidney disease, or unspecified chronic kidney disease: Secondary | ICD-10-CM | POA: Diagnosis not present

## 2019-10-12 DIAGNOSIS — I5042 Chronic combined systolic (congestive) and diastolic (congestive) heart failure: Secondary | ICD-10-CM | POA: Diagnosis not present

## 2019-10-12 DIAGNOSIS — R0789 Other chest pain: Secondary | ICD-10-CM | POA: Diagnosis present

## 2019-10-12 DIAGNOSIS — F17210 Nicotine dependence, cigarettes, uncomplicated: Secondary | ICD-10-CM | POA: Insufficient documentation

## 2019-10-12 DIAGNOSIS — J449 Chronic obstructive pulmonary disease, unspecified: Secondary | ICD-10-CM | POA: Insufficient documentation

## 2019-10-12 DIAGNOSIS — Z5321 Procedure and treatment not carried out due to patient leaving prior to being seen by health care provider: Secondary | ICD-10-CM | POA: Insufficient documentation

## 2019-10-12 DIAGNOSIS — N183 Chronic kidney disease, stage 3 unspecified: Secondary | ICD-10-CM | POA: Diagnosis not present

## 2019-10-12 DIAGNOSIS — Z7982 Long term (current) use of aspirin: Secondary | ICD-10-CM | POA: Insufficient documentation

## 2019-10-12 DIAGNOSIS — E1122 Type 2 diabetes mellitus with diabetic chronic kidney disease: Secondary | ICD-10-CM | POA: Insufficient documentation

## 2019-10-12 DIAGNOSIS — R0781 Pleurodynia: Secondary | ICD-10-CM

## 2019-10-12 DIAGNOSIS — R0602 Shortness of breath: Secondary | ICD-10-CM | POA: Insufficient documentation

## 2019-10-12 DIAGNOSIS — I2510 Atherosclerotic heart disease of native coronary artery without angina pectoris: Secondary | ICD-10-CM | POA: Insufficient documentation

## 2019-10-12 LAB — CBC
HCT: 40.7 % (ref 36.0–46.0)
Hemoglobin: 12.9 g/dL (ref 12.0–15.0)
MCH: 30.9 pg (ref 26.0–34.0)
MCHC: 31.7 g/dL (ref 30.0–36.0)
MCV: 97.4 fL (ref 80.0–100.0)
Platelets: 256 10*3/uL (ref 150–400)
RBC: 4.18 MIL/uL (ref 3.87–5.11)
RDW: 17.6 % — ABNORMAL HIGH (ref 11.5–15.5)
WBC: 10.4 10*3/uL (ref 4.0–10.5)
nRBC: 0 % (ref 0.0–0.2)

## 2019-10-12 LAB — BASIC METABOLIC PANEL
Anion gap: 9 (ref 5–15)
BUN: 9 mg/dL (ref 8–23)
CO2: 22 mmol/L (ref 22–32)
Calcium: 9.3 mg/dL (ref 8.9–10.3)
Chloride: 107 mmol/L (ref 98–111)
Creatinine, Ser: 1.12 mg/dL — ABNORMAL HIGH (ref 0.44–1.00)
GFR calc Af Amer: >60 mL/min (ref >60)
GFR calc non Af Amer: 53 mL/min — ABNORMAL LOW (ref >60)
Glucose, Bld: 170 mg/dL — ABNORMAL HIGH (ref 70–99)
Potassium: 5.1 mmol/L (ref 3.5–5.1)
Sodium: 138 mmol/L (ref 135–145)

## 2019-10-12 LAB — TROPONIN I (HIGH SENSITIVITY)
Troponin I (High Sensitivity): 16 ng/L (ref <18)
Troponin I (High Sensitivity): 19 ng/L — ABNORMAL HIGH (ref <18)

## 2019-10-12 LAB — D-DIMER, QUANTITATIVE: D-Dimer, Quant: 1.08 ug/mL-FEU — ABNORMAL HIGH (ref 0.00–0.50)

## 2019-10-12 MED ORDER — ASPIRIN 81 MG PO CHEW
324.0000 mg | CHEWABLE_TABLET | Freq: Once | ORAL | Status: AC
  Administered 2019-10-12: 324 mg via ORAL
  Filled 2019-10-12: qty 4

## 2019-10-12 MED ORDER — SODIUM CHLORIDE 0.9% FLUSH: 3.0000 mL | Freq: Once | INTRAVENOUS | Status: DC

## 2019-10-12 MED ORDER — NITROGLYCERIN 0.4 MG SL SUBL: 0.4000 mg | SUBLINGUAL_TABLET | SUBLINGUAL | Status: DC | PRN

## 2019-10-12 NOTE — ED Provider Notes (Signed)
[facility] EMERGENCY DEPARTMENT Provider Note   CSN: [id number] Arrival date & time: 10/12/19  1937     History Chief Complaint  Patient presents with  . Chest Pain    Tasha Myers is a 59 y.o. female.  The history is provided by the patient and medical records. No language interpreter was used.  Chest Pain Associated symptoms: shortness of breath      59 year old female significant history of CAD, prior MI, CHF, CKD, COPD, hypertension, diabetes, presenting for evaluation of chest pain.  Patient report yesterday she felt pain to the right side of her chest.  She described pain as a sharp pleuritic sensation with associated shortness of breath.  Pain improved overnight, but returned again this morning.  States she endorsed what would have pain with exertion even with walking a short distance which concerns her.  Since then, her pain has since improved and now rates as 5 out of 10.  Pain does radiates towards her back but no associated fever chills no nausea, diaphoresis, lightheadedness or dizziness.  She does endorse mild congestion.  She also endorses a nonproductive cough.  She report her brother recently passed away from a massive heart attack a week ago and states her other brother passed away from cardiac disease several years back.  She does have a significant history of cardiac disease.  She denies any prior history of PE or DVT.  She denies any strenuous activities.  She does endorse increasing stress due to the passing of her brother.  She denies any specific treatment tried.  She is up-to-date with her COVID-19 vaccination.  Past Medical History:  Diagnosis Date  . CAD S/P percutaneous coronary angioplasty    a. s/p BMS stent to RCA ~2000; b. PTCA of ISR in 2001; c. 09/2013 Cath: LM nl, LAD 59m, LCX min irregs, RI min irregs, RCA 20p ISR.  Marland Kitchen Chronic combined systolic and diastolic CHF (congestive heart failure) ([place])    a. 09/2013 Echo: EF 45-50%, no rwma,  Gr2 DD, mild AI, mildly dil LA.  . CKD (chronic kidney disease), stage III   . COPD (chronic obstructive pulmonary disease) ([place])   . Essential hypertension   . HLD (hyperlipidemia)   . Insomnia   . MI (myocardial infarction) ([place])   . Thoracic or lumbosacral neuritis or radiculitis, unspecified   . Type II diabetes mellitus [facility])     Patient Active Problem List   Diagnosis Date Noted  . Closed right hip fracture ([place]) 09/10/2018  . Mediastinal lymphadenopathy 09/10/2018  . Hip fracture ([place]) 09/10/2018  . ILD (interstitial lung disease) ([place]) 06/05/2018  . CKD (chronic kidney disease), stage III ([place])   . Essential hypertension   . Lung nodule, multiple 11/05/2014  . HLD (hyperlipidemia)   . Chronic combined systolic and diastolic CHF (congestive heart failure) ([place])   . CAD (coronary artery disease) 10/25/2010  . Hyperlipemia 10/25/2010  . Diabetes mellitus ([place]) 10/25/2010    Past Surgical History:  Procedure Laterality Date  . BREAST BIOPSY Right 2014   neg x 3 areas  . BREAST EXCISIONAL BIOPSY Left YRS AGO   NEG  . CARPAL TUNNEL RELEASE     BILATERAL  . CORONARY ARTERY BYPASS GRAFT    . INTRAMEDULLARY (IM) NAIL INTERTROCHANTERIC Right 09/10/2018   Procedure: INTRAMEDULLARY (IM) NAIL INTERTROCHANTRIC;  Surgeon: Meredith Pel, MD;  Location: [facility];  Service: Orthopedics;  Laterality: Right;  . LEFT HEART CATHETERIZATION WITH CORONARY ANGIOGRAM N/A 09/28/2013  Procedure: LEFT HEART CATHETERIZATION WITH CORONARY ANGIOGRAM;  Surgeon: Leonie Man, MD;  Location: [facility] CATH LAB;  Service: Cardiovascular;  Laterality: N/A;  . LYMPH NODE RESECTION    . PARTIAL HYSTERECTOMY       OB History   No obstetric history on file.     Family History  Problem Relation Age of Onset  . Coronary artery disease Mother   . Hypertension Mother   . Stroke Mother   . Diabetes Father   . Stroke Other        sibling  . Coronary artery disease Other        sibling  . Diabetes  Other        sibling  . Hypertension Other        sibling  . Breast cancer Neg Hx     Social History   Tobacco Use  . Smoking status: Current Every Day Smoker    Packs/day: 1.00    Years: 40.00    Pack years: 40.00    Types: Cigarettes  . Smokeless tobacco: Never Used  Substance Use Topics  . Alcohol use: Yes    Comment: occasionally  . Drug use: No    Home Medications Prior to Admission medications   Medication Sig Start Date End Date Taking? Authorizing Provider  amLODipine (NORVASC) 2.5 MG tablet Take 2.5 mg by mouth daily. 09/04/18   [provider]  aspirin EC 81 MG EC tablet Take 1 tablet (81 mg total) by mouth daily. Patient not taking: Reported on 10/21/2018 09/11/18   Velna Ochs, MD  carvedilol (COREG) 12.5 MG tablet Take 12.5 mg by mouth 2 (two) times daily with a meal.     [provider]  cholecalciferol (VITAMIN D3) 25 MCG (1000 UT) tablet Take 1,000 Units by mouth 2 (two) times a day.    [provider]  clopidogrel (PLAVIX) 75 MG tablet TAKE 1 TABLET BY MOUTH ONCE DAILY. Patient taking differently: Take 75 mg by mouth daily.  07/10/17   Minna Merritts, MD  furosemide (LASIX) 40 MG tablet Take 60 mg by mouth daily.     [provider]  gabapentin (NEURONTIN) 300 MG capsule Take 300 mg by mouth 2 (two) times daily.  09/05/18 09/05/19  [provider]  glipiZIDE (GLUCOTROL XL) 5 MG 24 hr tablet Take 10 mg by mouth daily.     [provider]  isosorbide mononitrate (IMDUR) 30 MG 24 hr tablet Take 30 mg by mouth daily.    [provider]  nitroGLYCERIN (NITROSTAT) 0.4 MG SL tablet Place 0.4 mg under the tongue every 5 (five) minutes as needed for chest pain.     [provider]  oxyCODONE (ROXICODONE) 15 MG immediate release tablet Take 15 mg by mouth 3 (three) times daily as needed for pain.  08/15/18   [provider]  pantoprazole (PROTONIX) 20 MG tablet Take 20 mg by mouth daily.   06/17/17 10/21/18  [provider]  rosuvastatin (CRESTOR) 20 MG tablet Take 40 mg by mouth daily.  09/04/17   [provider]  sitaGLIPtin (JANUVIA) 50 MG tablet Take 50 mg by mouth daily.  10/15/16 10/21/18  [provider]    Allergies    Empagliflozin  Review of Systems   Review of Systems  Respiratory: Positive for shortness of breath.   Cardiovascular: Positive for chest pain.  All other systems reviewed and are negative.   Physical Exam Updated Vital Signs BP (!) 141/77  Pulse 93   Temp 99.2 F (37.3 C) (Oral)   Resp 20   SpO2 97%   Physical Exam Vitals and nursing note reviewed.  Constitutional:      General: She is not in acute distress.    Appearance: She is well-developed.  HENT:     Head: Atraumatic.  Eyes:     Conjunctiva/sclera: Conjunctivae normal.  Neck:     Vascular: No JVD.  Cardiovascular:     Rate and Rhythm: Normal rate and regular rhythm.     Heart sounds: Normal heart sounds.  Pulmonary:     Breath sounds: Normal breath sounds. No decreased breath sounds, wheezing, rhonchi or rales.  Chest:     Chest wall: Tenderness (Tenderness to anterior chest wall without crepitus or emphysema.) present.  Musculoskeletal:     Cervical back: Neck supple.     Right lower leg: No edema.     Left lower leg: No edema.  Skin:    Findings: No rash.  Neurological:     Mental Status: She is alert and oriented to person, place, and time.     ED Results / Procedures / Treatments   Labs (all labs ordered are listed, but only abnormal results are displayed) Labs Reviewed  BASIC METABOLIC PANEL - Abnormal; Notable for the following components:      Result Value   Glucose, Bld 170 (*)    Creatinine, Ser 1.12 (*)    GFR calc non Af Amer 53 (*)    All other components within normal limits  CBC - Abnormal; Notable for the following components:   RDW 17.6 (*)    All other components within normal limits  D-DIMER, QUANTITATIVE (NOT AT [facility]) -  Abnormal; Notable for the following components:   D-Dimer, Quant 1.08 (*)    All other components within normal limits  TROPONIN I (HIGH SENSITIVITY) - Abnormal; Notable for the following components:   Troponin I (High Sensitivity) 19 (*)    All other components within normal limits  TROPONIN I (HIGH SENSITIVITY)    EKG EKG Interpretation  Date/Time:  Monday October 12 2019 08:35:15 EDT Ventricular Rate:  93 PR Interval:  152 QRS Duration: 144 QT Interval:  402 QTC Calculation: 499 R Axis:   61 Text Interpretation: Normal sinus rhythm Left bundle branch block Abnormal ECG No significant change since last tracing Confirmed by Quintella Reichert [phone]) on 10/12/2019 8:45:40 AM   Radiology DG Chest 2 View  Result Date: 10/12/2019 CLINICAL DATA:  59 year old female with history of right-sided chest pain and shortness of breath. EXAM: CHEST - 2 VIEW COMPARISON:  Chest x-ray 03/07/2019. FINDINGS: Diffuse interstitial prominence and peribronchial cuffing, similar to numerous prior examinations. No acute consolidative airspace disease. No pleural effusions. No evidence of pulmonary edema. Heart size is normal. Upper mediastinal contours are within normal limits. Aortic atherosclerosis. IMPRESSION: 1. Chronic changes again suggestive of interstitial lung disease, similar to prior examinations. These could be better evaluated with follow-up nonemergent high-resolution chest CT if clinically appropriate. No definite radiographic evidence of superimposed acute cardiopulmonary disease. 2. Aortic atherosclerosis. Electronically Signed   By: Vinnie Langton M.D.   On: 10/12/2019 09:09    Procedures Procedures (including critical care time)  Medications Ordered in ED Medications  sodium chloride flush (NS) 0.9 % injection 3 mL (has no administration in time range)  nitroGLYCERIN (NITROSTAT) SL tablet 0.4 mg (has no administration in time range)  aspirin chewable tablet 324 mg (324 mg Oral Given 10/12/19  1001)  ED Course  I have reviewed the triage vital signs and the nursing notes.  Pertinent labs & imaging results that were available during my care of the patient were reviewed by me and considered in my medical decision making (see chart for details).    MDM Rules/Calculators/A&P                          BP (!) 133/56   Pulse 79   Temp 99.2 F (37.3 C) (Oral)   Resp (!) 22   Ht 5\' 6"  (1.676 m)   Wt 73.9 kg   SpO2 99%   BMI 26.31 kg/m   Final Clinical Impression(s) / ED Diagnoses Final diagnoses:  Pleuritic chest pain    Rx / DC Orders ED Discharge Orders    None     9:23 AM Patient with significant history of cardiac disease here with pleuritic and reproducible right-sided chest wall pain.  She also endorsed shortness of breath.  Will obtain D-dimer, obtain delta troponin, will provide treatment for her pain and will monitor closely.  CAre discussed with DR. Ralene Bathe.   11:18 AM Initially high-sensitivity troponin is 16, delta troponin is 19.  Pain is essentially resolved with ASA and sublingual nitro.  Heart score 6 which puts her at moderate risk for MACE.    11:59 AM D-dimer is elevated at 1.08.  Will obtain chest CTA to rule out PE.  Patient currently resting comfortably.  12:29 PM Patient's nurse notified me that patient have left AMA despite receiving warning that her labs are abnormal and she would benefit from further work-up.  She persists to leave the department.  I was able to talk to patient via the phone and explained her abnormal lab results and I will recommendation for chest CTA and hospital admission for further work-up given her significant medical history.  I also made patient aware of the potential risk of leaving AMA which includes worsening of her symptoms and potential death.  Patient acknowledged but states that she just wants to go home and rest and will return if her symptoms worsen.  I strongly encourage patient to return to the ER for further  management of her condition.   Domenic Moras, PA-C 10/12/19 1231    Quintella Reichert, MD 10/13/19 1339

## 2019-10-12 NOTE — ED Notes (Signed)
D-dimer clotted per lab. Will recollect

## 2019-10-12 NOTE — ED Notes (Signed)
Pt came out of room dressed and with belongings in hand. Pt removed her own IV. Pt stated she is leaving. This RN informed pt one of her labs was indeed elevated which could indicate a blood clot which can be very serious. Pt states " I have been here since 0700 and if it hasnt happened by now its not going to happen. " This RN asked pt to wait to speak with the provider before she left. Pt stated she did not want to speak with the provider and that she wanted to leave. This RN stated she would be leaving against medical advice. Pt said she was fine.  Gertie Fey PA made aware.

## 2019-10-12 NOTE — ED Triage Notes (Signed)
Pt reports R sided chest pain that began yesterday as soreness, she reports sob and worsening chest pain this morning. Significant family  And personal history of cardiac events.

## 2019-11-24 ENCOUNTER — Other Ambulatory Visit: Payer: Self-pay | Admitting: Family Medicine

## 2019-11-24 DIAGNOSIS — M79661 Pain in right lower leg: Secondary | ICD-10-CM

## 2019-11-24 DIAGNOSIS — N186 End stage renal disease: Secondary | ICD-10-CM

## 2019-11-25 ENCOUNTER — Other Ambulatory Visit: Payer: Self-pay

## 2019-11-25 ENCOUNTER — Other Ambulatory Visit: Payer: Self-pay | Admitting: Family Medicine

## 2019-11-25 ENCOUNTER — Ambulatory Visit
Admission: RE | Admit: 2019-11-25 | Discharge: 2019-11-25 | Disposition: A | Discharge status: Home / Self Care | Payer: Medicare Other | Source: Ambulatory Visit | Attending: Family Medicine | Admitting: Family Medicine

## 2019-11-25 DIAGNOSIS — E1122 Type 2 diabetes mellitus with diabetic chronic kidney disease: Secondary | ICD-10-CM | POA: Insufficient documentation

## 2019-11-25 DIAGNOSIS — M79662 Pain in left lower leg: Secondary | ICD-10-CM | POA: Diagnosis present

## 2019-11-25 DIAGNOSIS — M79661 Pain in right lower leg: Secondary | ICD-10-CM | POA: Insufficient documentation

## 2019-11-25 DIAGNOSIS — N186 End stage renal disease: Secondary | ICD-10-CM | POA: Insufficient documentation

## 2020-01-30 ENCOUNTER — Emergency Department (HOSPITAL_COMMUNITY)
Admission: EM | Admit: 2020-01-30 | Discharge: 2020-01-30 | Disposition: A | Discharge status: Home / Self Care | Payer: Medicare Other | Attending: Emergency Medicine | Admitting: Emergency Medicine

## 2020-01-30 ENCOUNTER — Encounter (HOSPITAL_COMMUNITY): Payer: Self-pay | Admitting: Emergency Medicine

## 2020-01-30 ENCOUNTER — Other Ambulatory Visit: Payer: Self-pay

## 2020-01-30 ENCOUNTER — Emergency Department (HOSPITAL_COMMUNITY): Payer: Medicare Other

## 2020-01-30 DIAGNOSIS — E1122 Type 2 diabetes mellitus with diabetic chronic kidney disease: Secondary | ICD-10-CM | POA: Insufficient documentation

## 2020-01-30 DIAGNOSIS — Z7982 Long term (current) use of aspirin: Secondary | ICD-10-CM | POA: Insufficient documentation

## 2020-01-30 DIAGNOSIS — Z7984 Long term (current) use of oral hypoglycemic drugs: Secondary | ICD-10-CM | POA: Insufficient documentation

## 2020-01-30 DIAGNOSIS — F17210 Nicotine dependence, cigarettes, uncomplicated: Secondary | ICD-10-CM | POA: Insufficient documentation

## 2020-01-30 DIAGNOSIS — R519 Headache, unspecified: Secondary | ICD-10-CM | POA: Diagnosis present

## 2020-01-30 DIAGNOSIS — M792 Neuralgia and neuritis, unspecified: Secondary | ICD-10-CM | POA: Insufficient documentation

## 2020-01-30 DIAGNOSIS — Z951 Presence of aortocoronary bypass graft: Secondary | ICD-10-CM | POA: Insufficient documentation

## 2020-01-30 DIAGNOSIS — Z79899 Other long term (current) drug therapy: Secondary | ICD-10-CM | POA: Insufficient documentation

## 2020-01-30 DIAGNOSIS — I129 Hypertensive chronic kidney disease with stage 1 through stage 4 chronic kidney disease, or unspecified chronic kidney disease: Secondary | ICD-10-CM | POA: Diagnosis not present

## 2020-01-30 DIAGNOSIS — J449 Chronic obstructive pulmonary disease, unspecified: Secondary | ICD-10-CM | POA: Diagnosis not present

## 2020-01-30 DIAGNOSIS — N183 Chronic kidney disease, stage 3 unspecified: Secondary | ICD-10-CM | POA: Insufficient documentation

## 2020-01-30 DIAGNOSIS — I2510 Atherosclerotic heart disease of native coronary artery without angina pectoris: Secondary | ICD-10-CM | POA: Diagnosis not present

## 2020-01-30 LAB — CBC
HCT: 46 % (ref 36.0–46.0)
Hemoglobin: 14.8 g/dL (ref 12.0–15.0)
MCH: 30.3 pg (ref 26.0–34.0)
MCHC: 32.2 g/dL (ref 30.0–36.0)
MCV: 94.3 fL (ref 80.0–100.0)
Platelets: 276 10*3/uL (ref 150–400)
RBC: 4.88 MIL/uL (ref 3.87–5.11)
RDW: 17.6 % — ABNORMAL HIGH (ref 11.5–15.5)
WBC: 8.4 10*3/uL (ref 4.0–10.5)
nRBC: 0 % (ref 0.0–0.2)

## 2020-01-30 LAB — DIFFERENTIAL
Abs Immature Granulocytes: 0.04 10*3/uL (ref 0.00–0.07)
Basophils Absolute: 0.1 10*3/uL (ref 0.0–0.1)
Basophils Relative: 1 %
Eosinophils Absolute: 0.1 10*3/uL (ref 0.0–0.5)
Eosinophils Relative: 1 %
Immature Granulocytes: 1 %
Lymphocytes Relative: 21 %
Lymphs Abs: 1.7 10*3/uL (ref 0.7–4.0)
Monocytes Absolute: 0.5 10*3/uL (ref 0.1–1.0)
Monocytes Relative: 5 %
Neutro Abs: 6 10*3/uL (ref 1.7–7.7)
Neutrophils Relative %: 71 %

## 2020-01-30 LAB — COMPREHENSIVE METABOLIC PANEL
ALT: 12 U/L (ref 0–44)
AST: 16 U/L (ref 15–41)
Albumin: 4.2 g/dL (ref 3.5–5.0)
Alkaline Phosphatase: 91 U/L (ref 38–126)
Anion gap: 12 (ref 5–15)
BUN: 18 mg/dL (ref 8–23)
CO2: 26 mmol/L (ref 22–32)
Calcium: 9.9 mg/dL (ref 8.9–10.3)
Chloride: 100 mmol/L (ref 98–111)
Creatinine, Ser: 1.32 mg/dL — ABNORMAL HIGH (ref 0.44–1.00)
GFR, Estimated: 43 mL/min — ABNORMAL LOW (ref >60)
Glucose, Bld: 161 mg/dL — ABNORMAL HIGH (ref 70–99)
Potassium: 4 mmol/L (ref 3.5–5.1)
Sodium: 138 mmol/L (ref 135–145)
Total Bilirubin: 1.9 mg/dL — ABNORMAL HIGH (ref 0.3–1.2)
Total Protein: 8.3 g/dL — ABNORMAL HIGH (ref 6.5–8.1)

## 2020-01-30 LAB — APTT: aPTT: 31 seconds (ref 24–36)

## 2020-01-30 LAB — PROTIME-INR
INR: 1 (ref 0.8–1.2)
Prothrombin Time: 13 seconds (ref 11.4–15.2)

## 2020-01-30 MED ORDER — SODIUM CHLORIDE 0.9% FLUSH
3.0000 mL | Freq: Once | INTRAVENOUS | Status: DC
Start: 2020-01-30 — End: 2020-01-31

## 2020-01-30 NOTE — ED Provider Notes (Signed)
[facility] EMERGENCY DEPARTMENT Provider Note   CSN: [id number] Arrival date & time: 01/30/20  1330     History Chief Complaint  Patient presents with  . Headache    Tasha Myers is a 59 y.o. female with past medical history of diabetes, thoracic radiculitis, chronic opioid use, who presents to ED for burning sensation of her right neck for 3 days.  Patient states that pain started in her neck and radiates to her head.  She also complaining of feeling of heaviness in her right eye.  She denies jaw pain.  Nothing makes the pain better or worse.  Patient still has normal range of motion of her neck.  Denies neck stiffness, facial drooping, slurred speech, trouble swallowing, extremity weakness or photophobia.  She denies sick contact.  She does have history of peripheral neuropathy which she takes gabapentin 300 mg once a day.  HPI     Past Medical History:  Diagnosis Date  . CAD S/P percutaneous coronary angioplasty    a. s/p BMS stent to RCA ~2000; b. PTCA of ISR in 2001; c. 09/2013 Cath: LM nl, LAD 22m, LCX min irregs, RI min irregs, RCA 20p ISR.  Marland Kitchen Chronic combined systolic and diastolic CHF (congestive heart failure) ([place])    a. 09/2013 Echo: EF 45-50%, no rwma, Gr2 DD, mild AI, mildly dil LA.  . CKD (chronic kidney disease), stage III ([place])   . COPD (chronic obstructive pulmonary disease) ([place])   . Essential hypertension   . HLD (hyperlipidemia)   . Insomnia   . MI (myocardial infarction) ([place])   . Thoracic or lumbosacral neuritis or radiculitis, unspecified   . Type II diabetes mellitus [facility])     Patient Active Problem List   Diagnosis Date Noted  . Neuralgia 01/30/2020  . Closed right hip fracture ([place]) 09/10/2018  . Mediastinal lymphadenopathy 09/10/2018  . Hip fracture ([place]) 09/10/2018  . ILD (interstitial lung disease) ([place]) 06/05/2018  . CKD (chronic kidney disease), stage III ([place])   . Essential hypertension   . Lung nodule, multiple  11/05/2014  . HLD (hyperlipidemia)   . Chronic combined systolic and diastolic CHF (congestive heart failure) ([place])   . CAD (coronary artery disease) 10/25/2010  . Hyperlipemia 10/25/2010  . Diabetes mellitus ([place]) 10/25/2010    Past Surgical History:  Procedure Laterality Date  . BREAST BIOPSY Right 2014   neg x 3 areas  . BREAST EXCISIONAL BIOPSY Left YRS AGO   NEG  . CARPAL TUNNEL RELEASE     BILATERAL  . CORONARY ARTERY BYPASS GRAFT    . INTRAMEDULLARY (IM) NAIL INTERTROCHANTERIC Right 09/10/2018   Procedure: INTRAMEDULLARY (IM) NAIL INTERTROCHANTRIC;  Surgeon: Meredith Pel, MD;  Location: [facility];  Service: Orthopedics;  Laterality: Right;  . LEFT HEART CATHETERIZATION WITH CORONARY ANGIOGRAM N/A 09/28/2013   Procedure: LEFT HEART CATHETERIZATION WITH CORONARY ANGIOGRAM;  Surgeon: Leonie Man, MD;  Location: [facility] CATH LAB;  Service: Cardiovascular;  Laterality: N/A;  . LYMPH NODE RESECTION    . PARTIAL HYSTERECTOMY       OB History   No obstetric history on file.     Family History  Problem Relation Age of Onset  . Coronary artery disease Mother   . Hypertension Mother   . Stroke Mother   . Diabetes Father   . Stroke Other        sibling  . Coronary artery disease Other        sibling  . Diabetes  Other        sibling  . Hypertension Other        sibling  . Breast cancer Neg Hx     Social History   Tobacco Use  . Smoking status: Current Every Day Smoker    Packs/day: 1.00    Years: 40.00    Pack years: 40.00    Types: Cigarettes  . Smokeless tobacco: Never Used  Substance Use Topics  . Alcohol use: Yes    Comment: occasionally  . Drug use: No    Home Medications Prior to Admission medications   Medication Sig Start Date End Date Taking? Authorizing Provider  amLODipine (NORVASC) 2.5 MG tablet Take 2.5 mg by mouth daily. 09/04/18   [provider]  APPLE CIDER VINEGAR PO Take 450 mg by mouth daily.    [provider]  aspirin  EC 81 MG EC tablet Take 1 tablet (81 mg total) by mouth daily. Patient not taking: Reported on 10/21/2018 09/11/18   Velna Ochs, MD  carvedilol (COREG) 12.5 MG tablet Take 12.5 mg by mouth 2 (two) times daily with a meal.     [provider]  cholecalciferol (VITAMIN D3) 25 MCG (1000 UT) tablet Take 2,000 Units by mouth daily.     [provider]  clopidogrel (PLAVIX) 75 MG tablet TAKE 1 TABLET BY MOUTH ONCE DAILY. Patient taking differently: Take 75 mg by mouth daily.  07/10/17   Minna Merritts, MD  Cyanocobalamin (VITAMIN B-12) 2500 MCG SUBL Place 2,500 tablets under the tongue daily.    [provider]  furosemide (LASIX) 20 MG tablet Take 60 mg by mouth daily. 3 tabs daily= 60 mg    [provider]  glipiZIDE (GLUCOTROL XL) 10 MG 24 hr tablet Take 10 mg by mouth 2 (two) times daily.     [provider]  isosorbide mononitrate (IMDUR) 30 MG 24 hr tablet Take 30 mg by mouth daily.    [provider]  nitroGLYCERIN (NITROSTAT) 0.4 MG SL tablet Place 0.4 mg under the tongue every 5 (five) minutes as needed for chest pain.     [provider]  oxyCODONE (ROXICODONE) 15 MG immediate release tablet Take 15 mg by mouth 3 (three) times daily as needed for pain.  08/15/18   [provider]  pantoprazole (PROTONIX) 20 MG tablet Take 20 mg by mouth daily.  06/17/17 10/12/19  [provider]  potassium chloride SA (KLOR-CON) 20 MEQ tablet Take 1 tablet by mouth daily. 08/26/19   [provider]  rosuvastatin (CRESTOR) 20 MG tablet Take 20 mg by mouth daily.  09/04/17   [provider]  sitaGLIPtin (JANUVIA) 50 MG tablet Take 50 mg by mouth daily.  10/15/16 10/12/19  [provider]    Allergies    Empagliflozin  Review of Systems   Review of Systems  Respiratory: Negative for shortness of breath.   Cardiovascular: Negative for chest pain and palpitations.  Gastrointestinal: Negative for abdominal  pain.  Musculoskeletal: Positive for back pain and neck pain. Negative for gait problem and neck stiffness.  Skin: Negative.     Physical Exam Updated Vital Signs BP (!) 167/86 (BP Location: Right Arm)   Pulse 88   Temp 98.1 F (36.7 C) (Oral)   Resp 16   Ht 5\' 6"  (1.676 m)   Wt 74.8 kg   SpO2 99%   BMI 26.63 kg/m   Physical Exam Constitutional:      General: She is  not in acute distress. HENT:     Head: Normocephalic.     Comments: No pain to palpation of her head and her face Eyes:     General: No scleral icterus.       Right eye: No discharge.        Left eye: No discharge.     Extraocular Movements: Extraocular movements intact.     Conjunctiva/sclera: Conjunctivae normal.     Pupils: Pupils are equal, round, and reactive to light.  Neck:     Comments: Negative Spurling test bilaterally Normal range of motion, no pain with movement No pain to palpation of cervical and thoracic and lumbar spine Cardiovascular:     Rate and Rhythm: Normal rate and regular rhythm.     Heart sounds: Normal heart sounds. No murmur heard.   Pulmonary:     Effort: No respiratory distress.     Breath sounds: Normal breath sounds. No wheezing.  Musculoskeletal:        General: Normal range of motion.     Cervical back: Normal range of motion. No rigidity or tenderness.  Skin:    General: Skin is warm.  Neurological:     Mental Status: She is alert.     Comments: PERRLA Cranial nerves none deficit Normal range of motion and strength of bilateral upper and lower extremities  Psychiatric:        Mood and Affect: Mood normal.     ED Results / Procedures / Treatments   Labs (all labs ordered are listed, but only abnormal results are displayed) Labs Reviewed  CBC - Abnormal; Notable for the following components:      Result Value   RDW 17.6 (*)    All other components within normal limits  COMPREHENSIVE METABOLIC PANEL - Abnormal; Notable for the following components:   Glucose,  Bld 161 (*)    Creatinine, Ser 1.32 (*)    Total Protein 8.3 (*)    Total Bilirubin 1.9 (*)    GFR, Estimated 43 (*)    All other components within normal limits  PROTIME-INR  APTT  DIFFERENTIAL    EKG EKG Interpretation  Date/Time:  Saturday January 30 2020 17:59:09 EDT Ventricular Rate:  88 PR Interval:    QRS Duration: 146 QT Interval:  410 QTC Calculation: 497 R Axis:   78 Text Interpretation: Sinus rhythm IVCD, consider atypical LBBB similar to prior Abnormal ECG Confirmed by Carmin Muskrat [phone]) on 01/30/2020 6:28:37 PM   Radiology CT HEAD WO CONTRAST  Result Date: 01/30/2020 CLINICAL DATA:  Headache. EXAM: CT HEAD WITHOUT CONTRAST TECHNIQUE: Contiguous axial images were obtained from the base of the skull through the vertex without intravenous contrast. COMPARISON:  February 04, 2004 FINDINGS: Brain: No evidence of acute infarction, hemorrhage, hydrocephalus, extra-axial collection or mass lesion/mass effect. Vascular: Calcified atherosclerosis in the intracranial carotids. Skull: Normal. Negative for fracture or focal lesion. Sinuses/Orbits: No acute finding. Other: None. IMPRESSION: No acute intracranial abnormalities. No cause for the patient's headache identified. Electronically Signed   By: Dorise Bullion III M.D   On: 01/30/2020 16:59    Procedures Procedures (including critical care time)  Medications Ordered in ED Medications  sodium chloride flush (NS) 0.9 % injection 3 mL (has no administration in time range)    ED Course  I have reviewed the triage vital signs and the nursing notes.  Pertinent labs & imaging results that were available during my care of the patient were reviewed by me and considered in  my medical decision making (see chart for details).    MDM Rules/Calculators/A&P                          Patient presents to the ED for chief complaint of burning sensation of her head for 3 days.  She denies nuchal rigidity, chest pain, shortness  of breath, slurred speech, trouble swallowing, facial drooping, extremity weakness, photophobia or sick contact.  This is likely neuralgia.  Low suspicion for CVA, meningitis, or giant cell arteritis.  CT head negative for any abnormalities.  CBC and CMP are also unremarkable.  Patient is stable for discharge.  She is instructed to increase her gabapentin 3 times a day.  She can also use lidocaine patch over-the-counter as needed for pain.  Patient is advised to follow-up with her PCP and may have a referral to neurology if symptoms are not better.  Come back to the ED for worsening pain or new neurological symptoms.  Patient agrees with the plan.   Final Clinical Impression(s) / ED Diagnoses Final diagnoses:  Neuralgia    Rx / DC Orders ED Discharge Orders    None       Gaylan Gerold, DO 01/30/20 2024    Carmin Muskrat, MD 01/31/20 2318

## 2020-01-30 NOTE — ED Triage Notes (Addendum)
Pt reports 10/10 burning pain to back of head x 3 days with nausea and intermittent L arm tingling.  No arm drift.  Denies dizziness and weakness.  Denies history of headaches.

## 2020-01-30 NOTE — Discharge Instructions (Addendum)
Ms. Parrillo, you came to the ED for burning sensation on your right neck.  This is likely a neuropathic pain.  Your CT head came back normal.  Lab works are also unremarkable.  Please start taking the gabapentin 300 mg 3 times a day until pain is improved.  You can also get lidocaine patch over-the-counter and applied to your neck.  Please follow-up with your PCP and obtain a referral to neurologist if symptoms are not getting better.  Please come back to the ED for worsening symptoms or new neurological symptoms.  Take care

## 2020-05-14 ENCOUNTER — Emergency Department (HOSPITAL_COMMUNITY)
Admission: EM | Admit: 2020-05-14 | Discharge: 2020-05-14 | Disposition: A | Discharge status: Home / Self Care | Payer: Medicare Other | Attending: Emergency Medicine | Admitting: Emergency Medicine

## 2020-05-14 ENCOUNTER — Other Ambulatory Visit: Payer: Self-pay

## 2020-05-14 ENCOUNTER — Encounter (HOSPITAL_COMMUNITY): Payer: Self-pay | Admitting: Emergency Medicine

## 2020-05-14 DIAGNOSIS — Z5321 Procedure and treatment not carried out due to patient leaving prior to being seen by health care provider: Secondary | ICD-10-CM | POA: Insufficient documentation

## 2020-05-14 DIAGNOSIS — R109 Unspecified abdominal pain: Secondary | ICD-10-CM | POA: Diagnosis present

## 2020-05-14 DIAGNOSIS — K625 Hemorrhage of anus and rectum: Secondary | ICD-10-CM | POA: Insufficient documentation

## 2020-05-14 LAB — COMPREHENSIVE METABOLIC PANEL
ALT: 16 U/L (ref 0–44)
AST: 19 U/L (ref 15–41)
Albumin: 3.7 g/dL (ref 3.5–5.0)
Alkaline Phosphatase: 92 U/L (ref 38–126)
Anion gap: 9 (ref 5–15)
BUN: 8 mg/dL (ref 8–23)
CO2: 24 mmol/L (ref 22–32)
Calcium: 9.8 mg/dL (ref 8.9–10.3)
Chloride: 103 mmol/L (ref 98–111)
Creatinine, Ser: 1.1 mg/dL — ABNORMAL HIGH (ref 0.44–1.00)
GFR, Estimated: 57 mL/min — ABNORMAL LOW (ref >60)
Glucose, Bld: 189 mg/dL — ABNORMAL HIGH (ref 70–99)
Potassium: 4.1 mmol/L (ref 3.5–5.1)
Sodium: 136 mmol/L (ref 135–145)
Total Bilirubin: 0.8 mg/dL (ref 0.3–1.2)
Total Protein: 7.5 g/dL (ref 6.5–8.1)

## 2020-05-14 LAB — TYPE AND SCREEN
ABO/RH(D): O POS
Antibody Screen: NEGATIVE

## 2020-05-14 LAB — CBC
HCT: 45.5 % (ref 36.0–46.0)
Hemoglobin: 14.9 g/dL (ref 12.0–15.0)
MCH: 32 pg (ref 26.0–34.0)
MCHC: 32.7 g/dL (ref 30.0–36.0)
MCV: 97.6 fL (ref 80.0–100.0)
Platelets: 259 10*3/uL (ref 150–400)
RBC: 4.66 MIL/uL (ref 3.87–5.11)
RDW: 14.5 % (ref 11.5–15.5)
WBC: 8.4 10*3/uL (ref 4.0–10.5)
nRBC: 0 % (ref 0.0–0.2)

## 2020-05-14 NOTE — ED Triage Notes (Signed)
Pt states she had a colonoscopy scheduled at [facility] yesterday but once she got there they said she didn't do the prep correctly and "wasn't cleaned out".  Reports L sided abd pain, rectal pain, and rectal bleeding that started this morning.

## 2020-05-14 NOTE — ED Notes (Signed)
RN into room pt reports she cannot wait any longer for MD. Pt getting dressed to leave

## 2020-05-18 ENCOUNTER — Other Ambulatory Visit: Payer: Self-pay | Admitting: Specialist

## 2020-05-18 ENCOUNTER — Other Ambulatory Visit (HOSPITAL_COMMUNITY): Payer: Self-pay | Admitting: Specialist

## 2020-05-18 DIAGNOSIS — R590 Localized enlarged lymph nodes: Secondary | ICD-10-CM

## 2020-05-18 DIAGNOSIS — R0602 Shortness of breath: Secondary | ICD-10-CM

## 2020-05-27 ENCOUNTER — Telehealth: Payer: Self-pay | Admitting: Cardiovascular Disease

## 2020-05-27 NOTE — Telephone Encounter (Signed)
Patient has too many other issues right now Does not wish to schedule - will call if needed  Removing recall

## 2020-06-07 ENCOUNTER — Ambulatory Visit
Admission: RE | Admit: 2020-06-07 | Discharge: 2020-06-07 | Disposition: A | Discharge status: Home / Self Care | Payer: Medicare Other | Source: Ambulatory Visit | Attending: Specialist | Admitting: Specialist

## 2020-06-07 ENCOUNTER — Other Ambulatory Visit: Payer: Self-pay

## 2020-06-07 DIAGNOSIS — R0602 Shortness of breath: Secondary | ICD-10-CM | POA: Diagnosis present

## 2020-06-07 DIAGNOSIS — R590 Localized enlarged lymph nodes: Secondary | ICD-10-CM | POA: Diagnosis not present

## 2020-06-07 MED ORDER — IOHEXOL 300 MG/ML  SOLN
75.0000 mL | Freq: Once | INTRAMUSCULAR | Status: AC | PRN
  Administered 2020-06-07: 75 mL via INTRAVENOUS

## 2020-08-22 ENCOUNTER — Ambulatory Visit (HOSPITAL_COMMUNITY)
Admission: EM | Admit: 2020-08-22 | Discharge: 2020-08-22 | Disposition: A | Discharge status: Home / Self Care | Payer: Medicare Other | Attending: Emergency Medicine | Admitting: Emergency Medicine

## 2020-08-22 ENCOUNTER — Encounter (HOSPITAL_COMMUNITY): Payer: Self-pay | Admitting: *Deleted

## 2020-08-22 ENCOUNTER — Other Ambulatory Visit: Payer: Self-pay

## 2020-08-22 DIAGNOSIS — F17210 Nicotine dependence, cigarettes, uncomplicated: Secondary | ICD-10-CM | POA: Insufficient documentation

## 2020-08-22 DIAGNOSIS — U071 COVID-19: Secondary | ICD-10-CM | POA: Diagnosis present

## 2020-08-22 DIAGNOSIS — Z7982 Long term (current) use of aspirin: Secondary | ICD-10-CM | POA: Insufficient documentation

## 2020-08-22 DIAGNOSIS — Z79899 Other long term (current) drug therapy: Secondary | ICD-10-CM | POA: Diagnosis not present

## 2020-08-22 DIAGNOSIS — Z7984 Long term (current) use of oral hypoglycemic drugs: Secondary | ICD-10-CM | POA: Diagnosis not present

## 2020-08-22 LAB — POCT URINALYSIS DIPSTICK, ED / UC
Glucose, UA: NEGATIVE mg/dL
Nitrite: NEGATIVE
Protein, ur: 100 mg/dL — AB
Specific Gravity, Urine: 1.025 (ref 1.005–1.030)
Urobilinogen, UA: 2 mg/dL — ABNORMAL HIGH (ref 0.0–1.0)
pH: 5 (ref 5.0–8.0)

## 2020-08-22 MED ORDER — ACETAMINOPHEN 325 MG PO TABS
ORAL_TABLET | ORAL | Status: AC
  Filled 2020-08-22: qty 2

## 2020-08-22 MED ORDER — ONDANSETRON 4 MG PO TBDP
4.0000 mg | ORAL_TABLET | Freq: Once | ORAL | Status: AC
  Administered 2020-08-22: 4 mg via ORAL

## 2020-08-22 MED ORDER — ONDANSETRON 4 MG PO TBDP
ORAL_TABLET | ORAL | Status: AC
  Filled 2020-08-22: qty 1

## 2020-08-22 MED ORDER — ACETAMINOPHEN 325 MG PO TABS
650.0000 mg | ORAL_TABLET | Freq: Once | ORAL | Status: AC
Start: 2020-08-22 — End: 2020-08-22
  Administered 2020-08-22: 650 mg via ORAL

## 2020-08-22 MED ORDER — ONDANSETRON HCL 4 MG PO TABS: 4.0000 mg | ORAL_TABLET | Freq: Four times a day (QID) | ORAL | 0 refills | Status: DC

## 2020-08-22 NOTE — ED Provider Notes (Signed)
[facility]    CSN: [id number] Arrival date & time: 08/22/20  1018      History   Chief Complaint Chief Complaint  Patient presents with  . Abdominal Pain  . Nausea  . Chills    HPI Tasha Myers is a 60 y.o. female.   Patient presents with intermittent generalized headaches, body aches, chills,  lower abdominal pain and nausea present for 4 days. Decreased intake of food and fluids. Tested positive for Covid 4 days ago, used home test. Denies fever, vomiting, diarrhea, sore throat, ear pain, sinus pressure, shortness of breath, chest pain. Has not attempted treatment of any symptoms.   Past Medical History:  Diagnosis Date  . CAD S/P percutaneous coronary angioplasty    a. s/p BMS stent to RCA ~2000; b. PTCA of ISR in 2001; c. 09/2013 Cath: LM nl, LAD 67m LCX min irregs, RI min irregs, RCA 20p ISR.  .Marland KitchenChronic combined systolic and diastolic CHF (congestive heart failure) ([place]    a. 09/2013 Echo: EF 45-50%, no rwma, Gr2 DD, mild AI, mildly dil LA.  . CKD (chronic kidney disease), stage III ([place]   . COPD (chronic obstructive pulmonary disease) ([place]   . Essential hypertension   . HLD (hyperlipidemia)   . Insomnia   . MI (myocardial infarction) ([place]   . Thoracic or lumbosacral neuritis or radiculitis, unspecified   . Type II diabetes mellitus ([facility]     Patient Active Problem List   Diagnosis Date Noted  . Neuralgia 01/30/2020  . Closed right hip fracture ([facility] 09/10/2018  . Mediastinal lymphadenopathy 09/10/2018  . Hip fracture ([place] 09/10/2018  . ILD (interstitial lung disease) ([place] 06/05/2018  . CKD (chronic kidney disease), stage III ([place]   . Essential hypertension   . Lung nodule, multiple 11/05/2014  . HLD (hyperlipidemia)   . Chronic combined systolic and diastolic CHF (congestive heart failure) ([place]   . CAD (coronary artery disease) 10/25/2010  . Hyperlipemia 10/25/2010  . Diabetes mellitus ([facility] 10/25/2010    Past Surgical History:   Procedure Laterality Date  . BREAST BIOPSY Right 2014   neg x 3 areas  . BREAST EXCISIONAL BIOPSY Left YRS AGO   NEG  . CARPAL TUNNEL RELEASE     BILATERAL  . CORONARY ARTERY BYPASS GRAFT    . INTRAMEDULLARY (IM) NAIL INTERTROCHANTERIC Right 09/10/2018   Procedure: INTRAMEDULLARY (IM) NAIL INTERTROCHANTRIC;  Surgeon: DMeredith Pel MD;  Location: [place]  Service: Orthopedics;  Laterality: Right;  . LEFT HEART CATHETERIZATION WITH CORONARY ANGIOGRAM N/A 09/28/2013   Procedure: LEFT HEART CATHETERIZATION WITH CORONARY ANGIOGRAM;  Surgeon: DLeonie Man MD;  [facility];  Service: Cardiovascular;  Laterality: N/A;  . LYMPH NODE RESECTION    . PARTIAL HYSTERECTOMY      OB History   No obstetric history on file.      Home Medications    Prior to Admission medications   Medication Sig Start Date End Date Taking? Authorizing Provider  ondansetron (ZOFRAN) 4 MG tablet Take 1 tablet (4 mg total) by mouth every 6 (six) hours. 08/22/20  Yes Anesa Fronek R, NP  amLODipine (NORVASC) 2.5 MG tablet Take 2.5 mg by mouth daily. 09/04/18   [provider]  APPLE CIDER VINEGAR PO Take 450 mg by mouth daily.    [provider]  aspirin EC 81 MG EC tablet Take 1 tablet (81 mg total) by mouth daily. Patient not taking: Reported on 10/21/2018 09/11/18   Guilloud,  Hoyle Sauer, MD  carvedilol (COREG) 12.5 MG tablet Take 12.5 mg by mouth 2 (two) times daily with a meal.     [provider]  cholecalciferol (VITAMIN D3) 25 MCG (1000 UT) tablet Take 2,000 Units by mouth daily.     [provider]  clopidogrel (PLAVIX) 75 MG tablet TAKE 1 TABLET BY MOUTH ONCE DAILY. Patient taking differently: Take 75 mg by mouth daily.  07/10/17   Minna Merritts, MD  Cyanocobalamin (VITAMIN B-12) 2500 MCG SUBL Place 2,500 tablets under the tongue daily.    [provider]  furosemide (LASIX) 20 MG tablet Take 60 mg by mouth daily.    [provider]   gabapentin (NEURONTIN) 300 MG capsule Take 300 mg by mouth in the morning and at bedtime. 03/08/20   [provider]  glipiZIDE (GLUCOTROL XL) 10 MG 24 hr tablet Take 10 mg by mouth 2 (two) times daily.     [provider]  isosorbide mononitrate (IMDUR) 30 MG 24 hr tablet Take 30 mg by mouth daily.    [provider]  nitroGLYCERIN (NITROSTAT) 0.4 MG SL tablet Place 0.4 mg under the tongue every 5 (five) minutes as needed for chest pain.     [provider]  oxyCODONE (ROXICODONE) 15 MG immediate release tablet Take 15 mg by mouth 3 (three) times daily as needed for pain.  08/15/18   [provider]  pantoprazole (PROTONIX) 20 MG tablet Take 20 mg by mouth daily.  06/17/17 10/12/19  [provider]  potassium chloride SA (KLOR-CON) 20 MEQ tablet Take 20 mEq by mouth daily. 08/26/19   [provider]  rosuvastatin (CRESTOR) 20 MG tablet Take 20 mg by mouth daily.  09/04/17   [provider]  sitaGLIPtin (JANUVIA) 50 MG tablet Take 50 mg by mouth daily.  10/15/16 10/12/19  [provider]    Family History Family History  Problem Relation Age of Onset  . Coronary artery disease Mother   . Hypertension Mother   . Stroke Mother   . Diabetes Father   . Stroke Other        sibling  . Coronary artery disease Other        sibling  . Diabetes Other        sibling  . Hypertension Other        sibling  . Breast cancer Neg Hx     Social History Social History   Tobacco Use  . Smoking status: Current Every Day Smoker    Packs/day: 1.00    Years: 40.00    Pack years: 40.00    Types: Cigarettes  . Smokeless tobacco: Never Used  Substance Use Topics  . Alcohol use: Yes    Comment: occasionally  . Drug use: No     Allergies   Empagliflozin   Review of Systems Review of Systems  Constitutional: Positive for appetite change and chills. Negative for activity change, diaphoresis, fatigue, fever and unexpected  weight change.  HENT: Negative.   Respiratory: Negative.   Cardiovascular: Negative.   Gastrointestinal: Positive for abdominal pain and nausea. Negative for abdominal distention, anal bleeding, blood in stool, constipation, diarrhea, rectal pain and vomiting.  Skin: Negative.   Neurological: Negative.      Physical Exam Triage Vital Signs ED Triage Vitals  Enc Vitals Group     BP 08/22/20 1140 (!) 110/54     Pulse Rate 08/22/20 1140 89     Resp 08/22/20 1140 20  Temp 08/22/20 1140 98.8 F (37.1 C)     Temp Source 08/22/20 1140 Oral     SpO2 08/22/20 1140 100 %     Weight --      Height --      Head Circumference --      Peak Flow --      Pain Score 08/22/20 1138 8     Pain Loc --      Pain Edu? --      Excl. in [place]? --    No data found.  Updated Vital Signs BP (!) 110/54 (BP Location: Right Arm)   Pulse 89   Temp 98.8 F (37.1 C) (Oral)   Resp 20   SpO2 100%   Visual Acuity Right Eye Distance:   Left Eye Distance:   Bilateral Distance:    Right Eye Near:   Left Eye Near:    Bilateral Near:     Physical Exam Constitutional:      Appearance: She is well-developed and normal weight.  HENT:     Head: Normocephalic.  Eyes:     Extraocular Movements: Extraocular movements intact.  Pulmonary:     Effort: Pulmonary effort is normal.  Abdominal:     General: Abdomen is flat. Bowel sounds are normal.     Palpations: Abdomen is soft.     Tenderness: There is abdominal tenderness in the suprapubic area. There is right CVA tenderness and left CVA tenderness. There is no guarding or rebound.     Hernia: No hernia is present.  Musculoskeletal:        General: Normal range of motion.     Cervical back: Normal range of motion.  Skin:    General: Skin is warm and dry.  Neurological:     General: No focal deficit present.     Mental Status: She is alert and oriented to person, place, and time. Mental status is at baseline.  Psychiatric:        Mood and Affect:  Mood normal.        Behavior: Behavior normal.        Thought Content: Thought content normal.        Judgment: Judgment normal.      UC Treatments / Results  Labs (all labs ordered are listed, but only abnormal results are displayed) Labs Reviewed  POCT URINALYSIS DIPSTICK, ED / UC - Abnormal; Notable for the following components:      Result Value   Bilirubin Urine SMALL (*)    Ketones, ur TRACE (*)    Hgb urine dipstick MODERATE (*)    Protein, ur 100 (*)    Urobilinogen, UA 2.0 (*)    Leukocytes,Ua SMALL (*)    All other components within normal limits  URINE CULTURE    EKG   Radiology No results found.  Procedures Procedures (including critical care time)  Medications Ordered in UC Medications  ondansetron (ZOFRAN-ODT) disintegrating tablet 4 mg (4 mg Oral Given 08/22/20 1201)  acetaminophen (TYLENOL) tablet 650 mg (650 mg Oral Given 08/22/20 1217)    Initial Impression / Assessment and Plan / UC Course  I have reviewed the triage vital signs and the nursing notes.  Pertinent labs & imaging results that were available during my care of the patient were reviewed by me and considered in my medical decision making (see chart for details).  Covid  1. Urinalysis- defer to results review, sent for culture, history of CKD 2. zofran 4 mg odt now  3. Tylenol 650 mg now  4. zofran 4 mg every 6 hours as needed  Final Clinical Impressions(s) / UC Diagnoses   Final diagnoses:  COVID     Discharge Instructions     Can use nausea medication every 6 hours as needed   As nausea lessens, please rehydrate with fluids and attempt to eat, can start with bland foods to not irritate stomach  Can use 650 mg tylenol every 6 hours to help with stomach pain and headache  Urine results come back in 2-3 days, you will be called if results are concerning    ED Prescriptions    Medication Sig Dispense Auth. Provider   ondansetron (ZOFRAN) 4 MG tablet Take 1 tablet (4 mg  total) by mouth every 6 (six) hours. 12 tablet Lark Langenfeld, Leitha Schuller, NP     PDMP not reviewed this encounter.   Hans Eden, NP 08/22/20 1344

## 2020-08-22 NOTE — Discharge Instructions (Addendum)
Can use nausea medication every 6 hours as needed   As nausea lessens, please rehydrate with fluids and attempt to eat, can start with bland foods to not irritate stomach  Can use 650 mg tylenol every 6 hours to help with stomach pain and headache  Urine results come back in 2-3 days, you will be called if results are concerning

## 2020-08-22 NOTE — ED Triage Notes (Signed)
Pt had a PNA Vaccine last Monday . On Thursday pt did a home COVID test that was positive. Pt has had Nausea and ABD pain since Thursday night.

## 2020-08-24 ENCOUNTER — Telehealth (HOSPITAL_COMMUNITY): Payer: Self-pay | Admitting: Emergency Medicine

## 2020-08-24 LAB — URINE CULTURE: Culture: >=100000 — AB

## 2020-08-24 MED ORDER — SULFAMETHOXAZOLE-TRIMETHOPRIM 800-160 MG PO TABS: 1.0000 | ORAL_TABLET | Freq: Two times a day (BID) | ORAL | 0 refills | Status: AC

## 2020-12-09 ENCOUNTER — Other Ambulatory Visit: Payer: Self-pay | Admitting: Family Medicine

## 2020-12-09 DIAGNOSIS — Z1231 Encounter for screening mammogram for malignant neoplasm of breast: Secondary | ICD-10-CM
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
## 2020-12-21 ENCOUNTER — Encounter: Payer: Self-pay | Admitting: Cardiovascular Disease

## 2020-12-21 ENCOUNTER — Other Ambulatory Visit: Payer: Self-pay

## 2020-12-21 ENCOUNTER — Ambulatory Visit (INDEPENDENT_AMBULATORY_CARE_PROVIDER_SITE_OTHER): Payer: Medicare Other | Admitting: Cardiovascular Disease

## 2020-12-21 VITALS — BP 130/60 | HR 74 | Ht 66.0 in | Wt 161.0 lb

## 2020-12-21 DIAGNOSIS — N183 Chronic kidney disease, stage 3 unspecified: Secondary | ICD-10-CM | POA: Diagnosis not present

## 2020-12-21 DIAGNOSIS — E782 Mixed hyperlipidemia: Secondary | ICD-10-CM | POA: Diagnosis not present

## 2020-12-21 DIAGNOSIS — I25118 Atherosclerotic heart disease of native coronary artery with other forms of angina pectoris: Secondary | ICD-10-CM | POA: Diagnosis not present

## 2020-12-21 DIAGNOSIS — F17200 Nicotine dependence, unspecified, uncomplicated: Secondary | ICD-10-CM

## 2020-12-21 DIAGNOSIS — I10 Essential (primary) hypertension: Secondary | ICD-10-CM | POA: Diagnosis not present

## 2020-12-21 NOTE — Patient Instructions (Addendum)
Medication Instructions:  No changes  If you need a refill on your cardiac medications before your next appointment, please call your pharmacy.   Lab work: No new labs needed  Testing/Procedures: No new testing needed  Follow-Up: At [facility], you and your health needs are our priority.  As part of our continuing mission to provide you with exceptional heart care, we have created designated Provider Care Teams.  These Care Teams include your primary Cardiologist (physician) and Advanced Practice Providers (APPs -  Physician Assistants and Nurse Practitioners) who all work together to provide you with the care you need, when you need it.  You will need a follow up appointment in 12 months  Providers on your designated Care Team:   Christopher Berge, NP Ryan Dunn, PA-C Jacquelyn Visser, PA-C Cadence Furth, PA-C  COVID-19 Vaccine Information can be found at: [email or web address] For questions related to vaccine distribution or appointments, please email [email or web address] or call [phone].    

## 2020-12-29 ENCOUNTER — Ambulatory Visit
Admission: RE | Admit: 2020-12-29 | Discharge: 2020-12-29 | Disposition: A | Discharge status: Home / Self Care | Payer: Medicare Other | Source: Ambulatory Visit | Attending: Family Medicine | Admitting: Family Medicine

## 2020-12-29 ENCOUNTER — Other Ambulatory Visit: Payer: Self-pay

## 2020-12-29 DIAGNOSIS — Z1231 Encounter for screening mammogram for malignant neoplasm of breast: Secondary | ICD-10-CM | POA: Diagnosis not present

## 2021-02-14 ENCOUNTER — Emergency Department (HOSPITAL_COMMUNITY)
Admission: EM | Admit: 2021-02-14 | Discharge: 2021-02-14 | Disposition: A | Discharge status: Home / Self Care | Payer: Medicare Other | Attending: Emergency Medicine | Admitting: Emergency Medicine

## 2021-02-14 ENCOUNTER — Emergency Department (HOSPITAL_COMMUNITY): Payer: Medicare Other

## 2021-02-14 ENCOUNTER — Other Ambulatory Visit: Payer: Self-pay

## 2021-02-14 ENCOUNTER — Encounter (HOSPITAL_COMMUNITY): Payer: Self-pay | Admitting: Emergency Medicine

## 2021-02-14 DIAGNOSIS — R42 Dizziness and giddiness: Secondary | ICD-10-CM | POA: Diagnosis not present

## 2021-02-14 DIAGNOSIS — R0602 Shortness of breath: Secondary | ICD-10-CM | POA: Diagnosis not present

## 2021-02-14 DIAGNOSIS — Z20822 Contact with and (suspected) exposure to covid-19: Secondary | ICD-10-CM | POA: Insufficient documentation

## 2021-02-14 DIAGNOSIS — Z5321 Procedure and treatment not carried out due to patient leaving prior to being seen by health care provider: Secondary | ICD-10-CM | POA: Insufficient documentation

## 2021-02-14 DIAGNOSIS — R110 Nausea: Secondary | ICD-10-CM | POA: Insufficient documentation

## 2021-02-14 DIAGNOSIS — R079 Chest pain, unspecified: Secondary | ICD-10-CM | POA: Insufficient documentation

## 2021-02-14 LAB — CBC WITH DIFFERENTIAL/PLATELET
Abs Immature Granulocytes: 0.05 10*3/uL (ref 0.00–0.07)
Basophils Absolute: 0.1 10*3/uL (ref 0.0–0.1)
Basophils Relative: 1 %
Eosinophils Absolute: 0.1 10*3/uL (ref 0.0–0.5)
Eosinophils Relative: 1 %
HCT: 40.8 % (ref 36.0–46.0)
Hemoglobin: 13.7 g/dL (ref 12.0–15.0)
Immature Granulocytes: 1 %
Lymphocytes Relative: 18 %
Lymphs Abs: 1.9 10*3/uL (ref 0.7–4.0)
MCH: 30.9 pg (ref 26.0–34.0)
MCHC: 33.6 g/dL (ref 30.0–36.0)
MCV: 92.1 fL (ref 80.0–100.0)
Monocytes Absolute: 0.5 10*3/uL (ref 0.1–1.0)
Monocytes Relative: 5 %
Neutro Abs: 7.8 10*3/uL — ABNORMAL HIGH (ref 1.7–7.7)
Neutrophils Relative %: 74 %
Platelets: 241 10*3/uL (ref 150–400)
RBC: 4.43 MIL/uL (ref 3.87–5.11)
RDW: 14.7 % (ref 11.5–15.5)
WBC: 10.4 10*3/uL (ref 4.0–10.5)
nRBC: 0 % (ref 0.0–0.2)

## 2021-02-14 LAB — BASIC METABOLIC PANEL
Anion gap: 10 (ref 5–15)
BUN: 17 mg/dL (ref 8–23)
CO2: 24 mmol/L (ref 22–32)
Calcium: 9.6 mg/dL (ref 8.9–10.3)
Chloride: 101 mmol/L (ref 98–111)
Creatinine, Ser: 1.49 mg/dL — ABNORMAL HIGH (ref 0.44–1.00)
GFR, Estimated: 39 mL/min — ABNORMAL LOW (ref >60)
Glucose, Bld: 258 mg/dL — ABNORMAL HIGH (ref 70–99)
Potassium: 4 mmol/L (ref 3.5–5.1)
Sodium: 135 mmol/L (ref 135–145)

## 2021-02-14 LAB — RESP PANEL BY RT-PCR (FLU A&B, COVID) ARPGX2
Influenza A by PCR: NEGATIVE
Influenza B by PCR: NEGATIVE
SARS Coronavirus 2 by RT PCR: NEGATIVE

## 2021-02-14 LAB — TROPONIN I (HIGH SENSITIVITY)
Troponin I (High Sensitivity): 13 ng/L (ref <18)
Troponin I (High Sensitivity): 13 ng/L (ref <18)

## 2021-02-14 LAB — BRAIN NATRIURETIC PEPTIDE: B Natriuretic Peptide: 149.8 pg/mL — ABNORMAL HIGH (ref 0.0–100.0)

## 2021-02-14 NOTE — ED Notes (Signed)
Pt states she is going to sit in her car and wait for about 30 minutes. Explained she would miss her name for xray and any other scans they ordered. States "idc I have enough health problems as is I dont wanna sit in here with all these people."

## 2021-02-14 NOTE — ED Triage Notes (Signed)
Patient here with dizziness, nausea and chest pain.  Patient states that the chest pain started around 1900 tonight.  The dizziness started when she got up to go to the bathroom at the same time.  She feels like she is sweating.

## 2021-02-14 NOTE — ED Notes (Signed)
Pt did not want to wait and left. 

## 2021-02-14 NOTE — ED Provider Notes (Signed)
Emergency Medicine Provider Triage Evaluation Note  Tasha Myers , a 60 y.o. female  was evaluated in triage.  Pt complains of chest pain, pressure-like, that has been constant since onset at 1900 tonight.  She reports associated shortness of breath.  Reports that she feels as if she has had more fluid in her lungs over the last few days.  She also reports that she is having a hot flash and feels diaphoretic, but has not noticed any sweating.  No known fever or shivering.  She also endorses 4 days of congestion.  She did feel dizzy and lightheaded tonight when she got up to walk to the bathroom.  No vomiting, leg swelling, headache.  Review of Systems  Positive: Chest pain, shortness of breath, dizziness, lightheadedness, nausea, congestion Negative: Fever, chills, leg swelling, headache, vomiting  Physical Exam  BP (!) 131/58 (BP Location: Right Arm)   Pulse 91   Temp 98.7 F (37.1 C) (Oral)   Resp 17   SpO2 100%  Gen:   Awake, no distress   Resp:  Normal effort, lungs are clear to auscultation bilaterally MSK:   Moves extremities without difficulty  Other:  Skin is warm to the touch.  No diaphoresis.  Medical Decision Making  Medically screening exam initiated at 1:52 AM.  Appropriate orders placed.  Tasha Myers was informed that the remainder of the evaluation will be completed by another provider, this initial triage assessment does not replace that evaluation, and the importance of remaining in the ED until their evaluation is complete.  Labs and imaging have been ordered.  She will require further work-up and evaluation in the emergency department.   [name], PA-C 02/14/21 0152    Merryl Hacker, MD 02/14/21 [phone]

## 2021-05-23 ENCOUNTER — Encounter: Payer: Self-pay | Admitting: Internal Medicine

## 2021-05-24 ENCOUNTER — Ambulatory Visit: Admission: RE | Admit: 2021-05-24 | Payer: Medicare Other | Source: Home / Self Care | Admitting: Internal Medicine

## 2021-05-24 ENCOUNTER — Encounter: Admission: RE | Payer: Self-pay | Source: Home / Self Care

## 2021-05-24 HISTORY — DX: Unspecified osteoarthritis, unspecified site: M19.90

## 2021-05-24 HISTORY — DX: Gastro-esophageal reflux disease without esophagitis: K21.9

## 2021-05-24 SURGERY — COLONOSCOPY WITH PROPOFOL
Anesthesia: General

## 2021-07-17 ENCOUNTER — Other Ambulatory Visit: Payer: Self-pay

## 2021-07-17 ENCOUNTER — Emergency Department (HOSPITAL_COMMUNITY): Payer: Medicare Other

## 2021-07-17 ENCOUNTER — Emergency Department (HOSPITAL_COMMUNITY)
Admission: EM | Admit: 2021-07-17 | Discharge: 2021-07-17 | Disposition: A | Discharge status: Home / Self Care | Payer: Medicare Other | Attending: Emergency Medicine | Admitting: Emergency Medicine

## 2021-07-17 DIAGNOSIS — Z7982 Long term (current) use of aspirin: Secondary | ICD-10-CM | POA: Insufficient documentation

## 2021-07-17 DIAGNOSIS — I509 Heart failure, unspecified: Secondary | ICD-10-CM | POA: Diagnosis not present

## 2021-07-17 DIAGNOSIS — R208 Other disturbances of skin sensation: Secondary | ICD-10-CM | POA: Diagnosis present

## 2021-07-17 DIAGNOSIS — I2510 Atherosclerotic heart disease of native coronary artery without angina pectoris: Secondary | ICD-10-CM | POA: Diagnosis not present

## 2021-07-17 DIAGNOSIS — E1122 Type 2 diabetes mellitus with diabetic chronic kidney disease: Secondary | ICD-10-CM | POA: Diagnosis not present

## 2021-07-17 DIAGNOSIS — R519 Headache, unspecified: Secondary | ICD-10-CM | POA: Insufficient documentation

## 2021-07-17 DIAGNOSIS — B029 Zoster without complications: Secondary | ICD-10-CM

## 2021-07-17 DIAGNOSIS — Z7984 Long term (current) use of oral hypoglycemic drugs: Secondary | ICD-10-CM | POA: Insufficient documentation

## 2021-07-17 DIAGNOSIS — N185 Chronic kidney disease, stage 5: Secondary | ICD-10-CM | POA: Insufficient documentation

## 2021-07-17 LAB — CBC WITH DIFFERENTIAL/PLATELET
Abs Immature Granulocytes: 0.03 10*3/uL (ref 0.00–0.07)
Basophils Absolute: 0.1 10*3/uL (ref 0.0–0.1)
Basophils Relative: 1 %
Eosinophils Absolute: 0.2 10*3/uL (ref 0.0–0.5)
Eosinophils Relative: 2 %
HCT: 38.9 % (ref 36.0–46.0)
Hemoglobin: 12.9 g/dL (ref 12.0–15.0)
Immature Granulocytes: 0 %
Lymphocytes Relative: 26 %
Lymphs Abs: 2.2 10*3/uL (ref 0.7–4.0)
MCH: 31.4 pg (ref 26.0–34.0)
MCHC: 33.2 g/dL (ref 30.0–36.0)
MCV: 94.6 fL (ref 80.0–100.0)
Monocytes Absolute: 0.5 10*3/uL (ref 0.1–1.0)
Monocytes Relative: 6 %
Neutro Abs: 5.5 10*3/uL (ref 1.7–7.7)
Neutrophils Relative %: 65 %
Platelets: 291 10*3/uL (ref 150–400)
RBC: 4.11 MIL/uL (ref 3.87–5.11)
RDW: 14.7 % (ref 11.5–15.5)
WBC: 8.4 10*3/uL (ref 4.0–10.5)
nRBC: 0 % (ref 0.0–0.2)

## 2021-07-17 LAB — COMPREHENSIVE METABOLIC PANEL
ALT: 11 U/L (ref 0–44)
AST: 17 U/L (ref 15–41)
Albumin: 3.7 g/dL (ref 3.5–5.0)
Alkaline Phosphatase: 66 U/L (ref 38–126)
Anion gap: 9 (ref 5–15)
BUN: 8 mg/dL (ref 8–23)
CO2: 27 mmol/L (ref 22–32)
Calcium: 10.1 mg/dL (ref 8.9–10.3)
Chloride: 104 mmol/L (ref 98–111)
Creatinine, Ser: 1.6 mg/dL — ABNORMAL HIGH (ref 0.44–1.00)
GFR, Estimated: 36 mL/min — ABNORMAL LOW (ref >60)
Glucose, Bld: 170 mg/dL — ABNORMAL HIGH (ref 70–99)
Potassium: 3.6 mmol/L (ref 3.5–5.1)
Sodium: 140 mmol/L (ref 135–145)
Total Bilirubin: 0.5 mg/dL (ref 0.3–1.2)
Total Protein: 7.1 g/dL (ref 6.5–8.1)

## 2021-07-17 MED ORDER — VALACYCLOVIR HCL 1 G PO TABS: 1000.0000 mg | ORAL_TABLET | Freq: Every day | ORAL | 0 refills | Status: AC

## 2021-07-17 NOTE — ED Triage Notes (Signed)
Pt reports burning sensation to R side of head and down into neck and shoulder since Friday. Took hydrocodone with complete relief on Friday but has not taken any since then.  ?

## 2021-07-17 NOTE — ED Provider Notes (Signed)
?[facility] ?Provider Note ? ? ?CSN: [id number] ?Arrival date & time: 07/17/21  1322 ? ?  ? ?History ? ?No chief complaint on file. ? ? ?Tasha Myers is a 61 y.o. female with past medical history of diabetes mellitus, CKD, thoracic radiculitis, CAD, heart failure with reduced EF.  Presents to the emergency new part female with a chief complaint of burning sensation to the right side of her head since Friday.  Patient reports that pain has been constant over this time.  Pain does not radiate.  Patient reports that she had a rash appear on Friday however this resolved after she applied alcohol.  Patient denies any aggravating or relieving factors related to her pain.  Patient denies any recent falls or injuries. ? ?Denies any numbness, weakness, facial asymmetry, dysarthria, visual disturbance, eye pain, eye redness, eye discharge, ear pain, ear discharge.   ? ?HPI ? ?  ? ?Home Medications ?Prior to Admission medications   ?Medication Sig Start Date End Date Taking? Authorizing Provider  ?albuterol (VENTOLIN HFA) 108 (90 Base) MCG/ACT inhaler Inhale into the lungs every 6 (six) hours as needed for wheezing or shortness of breath.    [provider]  ?amLODipine (NORVASC) 2.5 MG tablet Take 2.5 mg by mouth daily. 09/04/18   [provider]  ?APPLE CIDER VINEGAR PO Take 450 mg by mouth daily.    [provider]  ?aspirin EC 81 MG EC tablet Take 1 tablet (81 mg total) by mouth daily. 09/11/18   Velna Ochs, MD  ?carvedilol (COREG) 12.5 MG tablet Take 12.5 mg by mouth 2 (two) times daily with a meal.     [provider]  ?cholecalciferol (VITAMIN D3) 25 MCG (1000 UT) tablet Take 2,000 Units by mouth daily.     [provider]  ?clopidogrel (PLAVIX) 75 MG tablet TAKE 1 TABLET BY MOUTH ONCE DAILY. ?Patient taking differently: Take 75 mg by mouth daily. 07/10/17   Minna Merritts, MD  ?Cyanocobalamin (VITAMIN B-12) 2500 MCG SUBL Place 2,500  tablets under the tongue daily.    [provider]  ?furosemide (LASIX) 20 MG tablet Take 60 mg by mouth daily.    [provider]  ?gabapentin (NEURONTIN) 300 MG capsule Take 300 mg by mouth in the morning and at bedtime. 03/08/20   [provider]  ?glipiZIDE (GLUCOTROL XL) 10 MG 24 hr tablet Take 10 mg by mouth 2 (two) times daily.     [provider]  ?isosorbide mononitrate (IMDUR) 30 MG 24 hr tablet Take 30 mg by mouth daily.    [provider]  ?nitroGLYCERIN (NITROSTAT) 0.4 MG SL tablet Place 0.4 mg under the tongue every 5 (five) minutes as needed for chest pain.     [provider]  ?ondansetron (ZOFRAN) 4 MG tablet Take 1 tablet (4 mg total) by mouth every 6 (six) hours. 08/22/20   Hans Eden, NP  ?oxyCODONE (ROXICODONE) 15 MG immediate release tablet Take 15 mg by mouth 3 (three) times daily as needed for pain.  08/15/18   [provider]  ?pantoprazole (PROTONIX) 20 MG tablet Take 20 mg by mouth daily.  06/17/17 12/21/20  [provider]  ?potassium chloride SA (KLOR-CON) 20 MEQ tablet Take 20 mEq by mouth daily. 08/26/19   [provider]  ?rosuvastatin (CRESTOR) 20 MG tablet Take 20 mg by mouth daily.  09/04/17   [provider]  ?sitaGLIPtin (JANUVIA) 50 MG tablet Take 50 mg  by mouth daily.  10/15/16 12/21/20  [provider]  ?umeclidinium-vilanterol (ANORO ELLIPTA) 62.5-25 MCG/ACT AEPB Inhale 1 puff into the lungs 2 (two) times daily.    [provider]  ?   ? ?Allergies    ?Empagliflozin and Iodinated contrast media   ? ?Review of Systems   ?Review of Systems  ?Constitutional:  Negative for chills and fever.  ?HENT:  Negative for ear discharge and ear pain.   ?Eyes:  Negative for photophobia, pain, discharge, redness, itching and visual disturbance.  ?Respiratory:  Negative for shortness of breath.   ?Cardiovascular:  Negative for chest pain.  ?Gastrointestinal:  Negative for abdominal pain, nausea  and vomiting.  ?Genitourinary:  Negative for difficulty urinating and dysuria.  ?Musculoskeletal:  Negative for back pain and neck pain.  ?Skin:  Positive for rash. Negative for color change, pallor and wound.  ?Neurological:  Negative for dizziness, tremors, seizures, syncope, facial asymmetry, speech difficulty, weakness, light-headedness, numbness and headaches.  ?Psychiatric/Behavioral:  Negative for confusion.   ? ?Physical Exam ?Updated Vital Signs ?BP 139/63   Pulse 89   Temp 98.6 ?F (37 ?C)   Resp 16   SpO2 99%  ?Physical Exam ?Vitals and nursing note reviewed.  ?Constitutional:   ?   General: She is not in acute distress. ?   Appearance: She is not ill-appearing, toxic-appearing or diaphoretic.  ?HENT:  ?   Head: Normocephalic.  ? ?   Comments: No rash or vesicles noted to patient's head.  She does have tenderness in the area outlined above. ?   Right Ear: Tympanic membrane, ear canal and external ear normal. No tenderness. No mastoid tenderness.  ?   Left Ear: Tympanic membrane, ear canal and external ear normal. No tenderness. No mastoid tenderness.  ?Eyes:  ?   General: No scleral icterus.    ?   Right eye: No discharge.     ?   Left eye: No discharge.  ?   Extraocular Movements: Extraocular movements intact.  ?   Conjunctiva/sclera: Conjunctivae normal.  ?   Pupils: Pupils are equal, round, and reactive to light.  ?Cardiovascular:  ?   Rate and Rhythm: Normal rate.  ?Pulmonary:  ?   Effort: Pulmonary effort is normal.  ?Musculoskeletal:  ?   Cervical back: Normal range of motion and neck supple. No edema, erythema, signs of trauma, rigidity, torticollis or crepitus. No pain with movement, spinous process tenderness or muscular tenderness. Normal range of motion.  ?Skin: ?   General: Skin is warm and dry.  ?Neurological:  ?   General: No focal deficit present.  ?   Mental Status: She is alert and oriented to person, place, and time.  ?   GCS: GCS eye subscore is 4. GCS verbal subscore is 5. GCS motor  subscore is 6.  ?   Cranial Nerves: Cranial nerves 2-12 are intact. No cranial nerve deficit, dysarthria or facial asymmetry.  ?   Comments: Patient moves all limbs equally without difficulty  ?Psychiatric:     ?   Behavior: Behavior is cooperative.  ? ? ?ED Results / Procedures / Treatments   ?Labs ?(all labs ordered are listed, but only abnormal results are displayed) ?Labs Reviewed  ?COMPREHENSIVE METABOLIC PANEL - Abnormal; Notable for the following components:  ?    Result Value  ? Glucose, Bld 170 (*)   ? Creatinine, Ser 1.60 (*)   ? GFR, Estimated 36 (*)   ? All other components within normal limits  ?  CBC WITH DIFFERENTIAL/PLATELET  ? ? ?EKG ?None ? ?Radiology ?CT Head Wo Contrast ? ?Result Date: 07/17/2021 ?CLINICAL DATA:  Headache.  Right-sided head pain. EXAM: CT HEAD WITHOUT CONTRAST TECHNIQUE: Contiguous axial images were obtained from the base of the skull through the vertex without intravenous contrast. RADIATION DOSE REDUCTION: This exam was performed according to the departmental dose-optimization program which includes automated exposure control, adjustment of the mA and/or kV according to patient size and/or use of iterative reconstruction technique. COMPARISON:  01/30/2020 FINDINGS: Brain: Mild cerebellar atrophy for age. No mass lesion, hemorrhage, hydrocephalus, acute infarct, intra-axial, or extra-axial fluid collection. Vascular: No hyperdense vessel or unexpected calcification. Skull: Normal Sinuses/Orbits: Normal imaged portions of the orbits and globes. Hypoplastic right frontal sinus. Clear mastoid air cells. Other: None. IMPRESSION: 1.  No acute intracranial abnormality. 2. Cerebellar atrophy. Electronically Signed   By: Abigail Miyamoto M.D.   On: 07/17/2021 14:44   ? ?Procedures ?Procedures  ? ? ?Medications Ordered in ED ?Medications - No data to display ? ?ED Course/ Medical Decision Making/ A&P ?  ?                        ?Medical Decision Making ?Risk ?Prescription drug  management. ? ? ?Alert 61 year old female in no acute stress, nontoxic-appearing.  Presents emergency department for complaint of burning sensation to right occipital/parietal region of head. ? ?Information obtained fro

## 2021-07-17 NOTE — ED Notes (Signed)
PA at bedside.

## 2021-07-17 NOTE — Discharge Instructions (Addendum)
You came to the emergency department today to be evaluated for burning sensation to the right side of your head.  Your lab work and CT scan did not show any acute abnormalities.  Based on your symptoms it is likely that you may have a shingles infection.  Due to this you were started on a reduced dose of valacyclovir due to your chronic kidney disease.  Please take this medication as prescribed.  Please follow-up with your primary care provider if your symptoms do not improve. ? ?Please take Tylenol (acetaminophen) to relieve your pain.  You make take tylenol, up to 1,000 mg (two extra strength pills) every 8 hours as needed.  Do not take more than 3,000 mg tylenol in a 24 hour period (not more than one dose every 8 hours.  Please check all medication labels as many medications such as pain and cold medications may contain tylenol.  Do not drink alcohol while taking these medications.  ? ? ?

## 2021-07-17 NOTE — ED Provider Triage Note (Signed)
Emergency Medicine Provider Triage Evaluation Note ?Doran Heater , a 61 y.o. female  was evaluated in triage.  Pt complains of right-sided burning headache since Friday.  Denies any visual deficits or hearing deficits.  Denies any nausea or vomiting.  She reports the burning sensation is constant.  Denies any trauma to the area.  Reports she has a history of aneurysms in her family and is concerned for this. ?  ?Review of Systems  ?Positive: headache ?Negative: See above ? ?Physical Exam  ?BP 139/63   Pulse 89   Temp 98.6 ?F (37 ?C)   Resp 16   SpO2 99%  ?Gen:   Awake, no distress   ?Resp:  Normal effort  ?MSK:   Moves extremities without difficulty  ?Other:  Cranial nerves II through XII intact.  Ambulatory.  PERRLA.  EOMI. ? ?Medical Decision Making  ?Medically screening exam initiated at 2:02 PM.  Appropriate orders placed.  CITLALY CAMPLIN was informed that the remainder of the evaluation will be completed by another provider, this initial triage assessment does not replace that evaluation, and the importance of remaining in the ED until their evaluation is complete. ? ?Discussed with Dr. Jeanell Sparrow. Will order basic labs and CT head non-contrast. ?  ?Sherrell Puller, PA-C ?07/17/21 1418 ? ?

## 2021-08-08 ENCOUNTER — Encounter: Payer: Self-pay | Admitting: Internal Medicine

## 2021-08-09 ENCOUNTER — Encounter
Admission: RE | Disposition: A | Discharge status: Home / Self Care | Payer: Self-pay | Source: Home / Self Care | Attending: Internal Medicine

## 2021-08-09 ENCOUNTER — Ambulatory Visit: Payer: Medicare Other | Admitting: Certified Registered Nurse Anesthetist

## 2021-08-09 ENCOUNTER — Ambulatory Visit
Admission: RE | Admit: 2021-08-09 | Discharge: 2021-08-09 | Disposition: A | Discharge status: Home / Self Care | Payer: Medicare Other | Attending: Internal Medicine | Admitting: Internal Medicine

## 2021-08-09 DIAGNOSIS — I130 Hypertensive heart and chronic kidney disease with heart failure and stage 1 through stage 4 chronic kidney disease, or unspecified chronic kidney disease: Secondary | ICD-10-CM | POA: Insufficient documentation

## 2021-08-09 DIAGNOSIS — Z955 Presence of coronary angioplasty implant and graft: Secondary | ICD-10-CM | POA: Diagnosis not present

## 2021-08-09 DIAGNOSIS — K219 Gastro-esophageal reflux disease without esophagitis: Secondary | ICD-10-CM | POA: Insufficient documentation

## 2021-08-09 DIAGNOSIS — I2510 Atherosclerotic heart disease of native coronary artery without angina pectoris: Secondary | ICD-10-CM | POA: Insufficient documentation

## 2021-08-09 DIAGNOSIS — J449 Chronic obstructive pulmonary disease, unspecified: Secondary | ICD-10-CM | POA: Diagnosis not present

## 2021-08-09 DIAGNOSIS — K640 First degree hemorrhoids: Secondary | ICD-10-CM | POA: Diagnosis not present

## 2021-08-09 DIAGNOSIS — Z86010 Personal history of colonic polyps: Secondary | ICD-10-CM | POA: Insufficient documentation

## 2021-08-09 DIAGNOSIS — I252 Old myocardial infarction: Secondary | ICD-10-CM | POA: Insufficient documentation

## 2021-08-09 DIAGNOSIS — K5900 Constipation, unspecified: Secondary | ICD-10-CM | POA: Diagnosis not present

## 2021-08-09 DIAGNOSIS — Z7984 Long term (current) use of oral hypoglycemic drugs: Secondary | ICD-10-CM | POA: Insufficient documentation

## 2021-08-09 DIAGNOSIS — Z7902 Long term (current) use of antithrombotics/antiplatelets: Secondary | ICD-10-CM | POA: Diagnosis not present

## 2021-08-09 DIAGNOSIS — N189 Chronic kidney disease, unspecified: Secondary | ICD-10-CM | POA: Diagnosis not present

## 2021-08-09 DIAGNOSIS — G894 Chronic pain syndrome: Secondary | ICD-10-CM | POA: Diagnosis not present

## 2021-08-09 DIAGNOSIS — E785 Hyperlipidemia, unspecified: Secondary | ICD-10-CM | POA: Diagnosis not present

## 2021-08-09 DIAGNOSIS — Z79891 Long term (current) use of opiate analgesic: Secondary | ICD-10-CM | POA: Insufficient documentation

## 2021-08-09 DIAGNOSIS — E1122 Type 2 diabetes mellitus with diabetic chronic kidney disease: Secondary | ICD-10-CM | POA: Diagnosis not present

## 2021-08-09 DIAGNOSIS — Z09 Encounter for follow-up examination after completed treatment for conditions other than malignant neoplasm: Secondary | ICD-10-CM | POA: Insufficient documentation

## 2021-08-09 DIAGNOSIS — Z800 Family history of malignant neoplasm of digestive organs: Secondary | ICD-10-CM | POA: Insufficient documentation

## 2021-08-09 DIAGNOSIS — I5042 Chronic combined systolic (congestive) and diastolic (congestive) heart failure: Secondary | ICD-10-CM | POA: Diagnosis not present

## 2021-08-09 HISTORY — PX: COLONOSCOPY WITH PROPOFOL: SHX5780

## 2021-08-09 LAB — GLUCOSE, CAPILLARY: Glucose-Capillary: 153 mg/dL — ABNORMAL HIGH (ref 70–99)

## 2021-08-09 SURGERY — COLONOSCOPY WITH PROPOFOL
Anesthesia: General

## 2021-08-09 MED ORDER — LIDOCAINE HCL (PF) 2 % IJ SOLN
INTRAMUSCULAR | Status: AC
  Filled 2021-08-09: qty 5

## 2021-08-09 MED ORDER — PROPOFOL 500 MG/50ML IV EMUL
INTRAVENOUS | Status: AC
  Filled 2021-08-09: qty 50

## 2021-08-09 MED ORDER — PROPOFOL 10 MG/ML IV BOLUS
INTRAVENOUS | Status: DC | PRN
  Administered 2021-08-09: 50 mg via INTRAVENOUS

## 2021-08-09 MED ORDER — PROPOFOL 500 MG/50ML IV EMUL
INTRAVENOUS | Status: DC | PRN
  Administered 2021-08-09: 150 ug/kg/min via INTRAVENOUS

## 2021-08-09 MED ORDER — LIDOCAINE HCL (CARDIAC) PF 100 MG/5ML IV SOSY
PREFILLED_SYRINGE | INTRAVENOUS | Status: DC | PRN
  Administered 2021-08-09: 50 mg via INTRAVENOUS

## 2021-08-09 MED ORDER — GLYCOPYRROLATE 0.2 MG/ML IJ SOLN
INTRAMUSCULAR | Status: DC | PRN
  Administered 2021-08-09: .1 mg via INTRAVENOUS

## 2021-08-09 MED ORDER — SODIUM CHLORIDE 0.9 % IV SOLN
INTRAVENOUS | Status: DC
  Administered 2021-08-09: 20 mL/h via INTRAVENOUS

## 2021-08-09 NOTE — Interval H&P Note (Signed)
History and Physical Interval Note: ? ?08/09/2021 ?8:34 AM ? ?Tasha Myers  has presented today for surgery, with the diagnosis of Z86.010 - Hx of adenomatous colonic polyps.  The various methods of treatment have been discussed with the patient and family. After consideration of risks, benefits and other options for treatment, the patient has consented to  Procedure(s) with comments: ?COLONOSCOPY WITH PROPOFOL (N/A) - DM as a surgical intervention.  The patient's history has been reviewed, patient examined, no change in status, stable for surgery.  I have reviewed the patient's chart and labs.  Questions were answered to the patient's satisfaction.   ? ? ?[place], [place] ? ? ?

## 2021-08-09 NOTE — Op Note (Signed)
[facility] ?Gastroenterology ?Patient Name: Tasha Myers ?Procedure Date: 08/09/2021 7:25 AM ?MRN: [id number] ?Account #: [id number] ?Date of Birth: 11-29-1960 ?Admit Type: Outpatient ?Age: 61 ?Room: [facility] ENDO ROOM 2 ?Gender: Female ?Note Status: Finalized ?Instrument Name: Colonoscope [id number] ?Procedure:             Colonoscopy ?Indications:           Surveillance: Personal history of adenomatous polyps  ?                       on last colonoscopy > 5 years ago ?Providers:             Benay Pike. Traevion Poehler MD, MD ?Medicines:             Propofol per Anesthesia ?Complications:         No immediate complications. ?Procedure:             Pre-Anesthesia Assessment: ?                       - The risks and benefits of the procedure and the  ?                       sedation options and risks were discussed with the  ?                       patient. All questions were answered and informed  ?                       consent was obtained. ?                       - Patient identification and proposed procedure were  ?                       verified prior to the procedure by the nurse. The  ?                       procedure was verified in the procedure room. ?                       - ASA Grade Assessment: III - A patient with severe  ?                       systemic disease. ?                       - After reviewing the risks and benefits, the patient  ?                       was deemed in satisfactory condition to undergo the  ?                       procedure. ?                       After obtaining informed consent, the colonoscope was  ?                       passed under direct vision. Throughout the procedure,  ?  the patient's blood pressure, pulse, and oxygen  ?                       saturations were monitored continuously. The  ?                       Colonoscope was introduced through the anus and  ?                       advanced to the the cecum, identified by appendiceal  ?                        orifice and ileocecal valve. The colonoscopy was  ?                       performed without difficulty. The patient tolerated  ?                       the procedure well. The quality of the bowel  ?                       preparation was good. The ileocecal valve, appendiceal  ?                       orifice, and rectum were photographed. ?Findings: ?     The perianal and digital rectal examinations were normal. Pertinent  ?     negatives include normal sphincter tone and no palpable rectal lesions. ?     Non-bleeding internal hemorrhoids were found during retroflexion. The  ?     hemorrhoids were Grade I (internal hemorrhoids that do not prolapse). ?     The colon (entire examined portion) appeared normal. ?Impression:            - Non-bleeding internal hemorrhoids. ?                       - The entire examined colon is normal. ?                       - No specimens collected. ?Recommendation:        - Patient has a contact number available for  ?                       emergencies. The signs and symptoms of potential  ?                       delayed complications were discussed with the patient.  ?                       Return to normal activities tomorrow. Written  ?                       discharge instructions were provided to the patient. ?                       - Resume previous diet. ?                       - Continue present medications. ?                       -  Resume Plavix (clopidogrel) at prior dose today.  ?                       Refer to managing physician for further adjustment of  ?                       therapy. ?                       - Repeat colonoscopy in 10 years for screening  ?                       purposes. ?                       - Return to GI office PRN. ?                       - The findings and recommendations were discussed with  ?                       the patient. ?Procedure Code(s):     --- Professional --- ?                       G0105, Colorectal cancer screening;  colonoscopy on  ?                       individual at high risk ?Diagnosis Code(s):     --- Professional --- ?                       K64.0, First degree hemorrhoids ?                       Z86.010, Personal history of colonic polyps ?CPT copyright 2019 American Medical Association. All rights reserved. ?The codes documented in this report are preliminary and upon coder review may  ?be revised to meet current compliance requirements. ?Efrain Sella MD, MD ?08/09/2021 9:08:05 AM ?This report has been signed electronically. ?Number of Addenda: 0 ?Note Initiated On: 08/09/2021 7:25 AM ?Scope Withdrawal Time: 0 hours 5 minutes 51 seconds  ?Total Procedure Duration: 0 hours 11 minutes 41 seconds  ?Estimated Blood Loss:  Estimated blood loss: none. ?     [facility] ?

## 2021-08-09 NOTE — H&P (Signed)
Outpatient short stay form Pre-procedure ?08/09/2021 8:33 AM ?Lorie Apley K. Alice Reichert, M.D. ? ?Primary Physician: Modena Morrow, M.D. ? ?Reason for visit:  Personal history of adenomatous colon polyps ? ?History of present illness:  Ms. Hogle presented to the [facility] GI clinic for chief complaint of high-risk colon cancer surveillance 2/2 personal hx of adenomatous colon polyps. She was referred here by her PCP for discussion on scheduling a colonoscopy. Last colonoscopy performed 04/2020 at [facility] showed stool in the entire examined colon precluding visualization. She had colonoscopy 04/2017 performed at [facility] showing SSA and hyperplastic polyps. She has had difficulties getting cleaned out for her procedure. She struggles with chronic pain syndrome and is on chronic narcotic pain medications. She struggles with constipation and reports she has found something that works for her. She drinks apple juice daily and some prunes and this helps move her bowels better. She reports currently she is having a bowel movement daily which is like a #1-4 on [place] stool scale. She denies any abdominal pain or abdominal cramping. Earlier this summer she was struggling with loss of appetite and 5-8 lb weight loss. She attributes this to some dental issues which have improved now. She is gaining some weight back and denies any issues with loss of appetite. She does have hx of GERD and reports these symptoms are well-controlled without any refractory symptoms. She takes Protonix 20 mg daily. She denies any issues with esophageal dysphagia, odynophagia, early satiety, hoarseness, nausea, vomiting, or epigastric abdominal pain. She does endorse some issues with hemorrhoids with some bleeding on tissue paper and some itching. She is requesting topical treatment for hemorrhoids today. She also endorses a family history of rectal cancer in her sister at age 39. She is scheduled to see Cardiology later this week for follow-up on abnormal  echocardiogram showing moderate LV systolic dysfunction, EF 08%, moderate valve regurgitation. She takes Plavix daily. No other questions or complaints today.  ? ? ? ?Current Facility-Administered Medications:  ?  0.9 %  sodium chloride infusion, , Intravenous, Continuous, [place], Benay Pike, MD, Last Rate: 20 mL/hr at 08/09/21 0750, Continued from Pre-op at 08/09/21 0750 ? ?Medications Prior to Admission  ?Medication Sig Dispense Refill Last Dose  ? albuterol (VENTOLIN HFA) 108 (90 Base) MCG/ACT inhaler Inhale into the lungs every 6 (six) hours as needed for wheezing or shortness of breath.   Past Week  ? amLODipine (NORVASC) 2.5 MG tablet Take 2.5 mg by mouth daily.   Past Week  ? APPLE CIDER VINEGAR PO Take 450 mg by mouth daily.   Past Week  ? aspirin EC 81 MG EC tablet Take 1 tablet (81 mg total) by mouth daily. 1 tablet 0 08/09/2021 at 0600  ? carvedilol (COREG) 12.5 MG tablet Take 12.5 mg by mouth 2 (two) times daily with a meal.    Past Week  ? cholecalciferol (VITAMIN D3) 25 MCG (1000 UT) tablet Take 2,000 Units by mouth daily.    Past Week  ? clopidogrel (PLAVIX) 75 MG tablet TAKE 1 TABLET BY MOUTH ONCE DAILY. (Patient taking differently: Take 75 mg by mouth daily.) 30 tablet 3 Past Week  ? Cyanocobalamin (VITAMIN B-12) 2500 MCG SUBL Place 2,500 tablets under the tongue daily.   Past Week  ? furosemide (LASIX) 20 MG tablet Take 60 mg by mouth daily.   Past Week  ? gabapentin (NEURONTIN) 300 MG capsule Take 300 mg by mouth in the morning and at bedtime.   Past Week  ? glipiZIDE (GLUCOTROL XL)  10 MG 24 hr tablet Take 10 mg by mouth 2 (two) times daily.    Past Week  ? isosorbide mononitrate (IMDUR) 30 MG 24 hr tablet Take 30 mg by mouth daily.   Past Week  ? nitroGLYCERIN (NITROSTAT) 0.4 MG SL tablet Place 0.4 mg under the tongue every 5 (five) minutes as needed for chest pain.    Past Week  ? ondansetron (ZOFRAN) 4 MG tablet Take 1 tablet (4 mg total) by mouth every 6 (six) hours. 12 tablet 0 Past Week  ?  oxyCODONE (ROXICODONE) 15 MG immediate release tablet Take 15 mg by mouth 3 (three) times daily as needed for pain.    Past Week  ? potassium chloride SA (KLOR-CON) 20 MEQ tablet Take 20 mEq by mouth daily.   Past Week  ? rosuvastatin (CRESTOR) 20 MG tablet Take 20 mg by mouth daily.    Past Week  ? umeclidinium-vilanterol (ANORO ELLIPTA) 62.5-25 MCG/ACT AEPB Inhale 1 puff into the lungs 2 (two) times daily.   Past Week  ? pantoprazole (PROTONIX) 20 MG tablet Take 20 mg by mouth daily.      ? sitaGLIPtin (JANUVIA) 50 MG tablet Take 50 mg by mouth daily.      ? ? ? ?Allergies  ?Allergen Reactions  ? Empagliflozin Other (See Comments)  ?  UTI/sepsis  ? Iodinated Contrast Media Itching  ? ? ? ?Past Medical History:  ?Diagnosis Date  ? Arthritis   ? CAD S/P percutaneous coronary angioplasty   ? a. s/p BMS stent to RCA ~2000; b. PTCA of ISR in 2001; c. 09/2013 Cath: LM nl, LAD 41m, LCX min irregs, RI min irregs, RCA 20p ISR.  ? Chronic combined systolic and diastolic CHF (congestive heart failure) ([place])   ? a. 09/2013 Echo: EF 45-50%, no rwma, Gr2 DD, mild AI, mildly dil LA.  ? CKD (chronic kidney disease), stage III ([place])   ? COPD (chronic obstructive pulmonary disease) ([place])   ? Essential hypertension   ? GERD (gastroesophageal reflux disease)   ? HLD (hyperlipidemia)   ? Insomnia   ? MI (myocardial infarction) ([place])   ? Thoracic or lumbosacral neuritis or radiculitis, unspecified   ? Type II diabetes mellitus ([place])   ? ? ?Review of systems:  Otherwise negative.  ? ? ?Physical Exam ? ?Gen: Alert, oriented. Appears stated age.  ?HEENT: [place]/AT. PERRLA. ?Lungs: CTA, no wheezes. ?CV: RR nl S1, S2. ?Abd: soft, benign, no masses. BS+ ?Ext: No edema. Pulses 2+ ? ? ? ?Planned procedures: Proceed with colonoscopy. The patient understands the nature of the planned procedure, indications, risks, alternatives and potential complications including but not limited to bleeding, infection, perforation, damage to internal organs and  possible oversedation/side effects from anesthesia. The patient agrees and gives consent to proceed.  ?Please refer to procedure notes for findings, recommendations and patient disposition/instructions.  ? ? ? ?Donicia Druck K. Alice Reichert, M.D. ?Gastroenterology ?08/09/2021  8:33 AM ? ? ? ? ? ? ?

## 2021-08-09 NOTE — Anesthesia Preprocedure Evaluation (Addendum)
Anesthesia Evaluation  ?Patient identified by MRN, date of birth, ID band ?Patient awake ? ? ? ?Reviewed: ?Allergy & Precautions, NPO status , Patient's Chart, lab work & pertinent test results ? ?History of Anesthesia Complications ?Negative for: history of anesthetic complications ? ?Airway ?Mallampati: II ? ?TM Distance: >3 FB ?Neck ROM: Full ? ? ? Dental ?no notable dental hx. ?(+) Teeth Intact ?  ?Pulmonary ?neg sleep apnea, COPD,  COPD inhaler, Current SmokerPatient did not abstain from smoking.,  ?  ?Pulmonary exam normal ?breath sounds clear to auscultation ? ? ? ? ? ? Cardiovascular ?Exercise Tolerance: Good ?METShypertension, pulmonary hypertension+ CAD, + Past MI, + Cardiac Stents and +CHF  ?(-) dysrhythmias + Valvular Problems/Murmurs AI  ?Rhythm:Regular Rate:Normal ?- Systolic murmurs ?S/p PCI in November 2022. Patient says her cardiologist said it was OK to have this procedure/pause Plavix, while taking baby aspirin (which she took today). I do not see a documented note detailing such, but patient is confident that this is what the cardiologist office said. ? ?TTE showed EF 45%, moderate AI. Cath showing moderate pulm HTN. ? ?Cath: ?1. ?Double vessel obstructive coronary artery disease. ?There is a large RPL branch of the right coronary artery which appears chronically occluded, fills via brisk left to right collaterals. ?There is a relatively focal 80% lesion within?the mid LAD. ?  ?There is mild nonobstructive disease within the circumflex marginal distribution. ?To further evaluate the hemodynamic significance of the mid LAD stenosis, RFR was performed which was 0.83 indicating that the lesion is indeed physiologically significant  ??and PCI is indicated.  ?2. ?Moderate pulmonary hypertension with elevated filling pressures consistent with secondary pulmonary hypertension. Reduced cardiac index of 1.9 L/min/m2  ?3. ?Successful single-vessel PCI. ?The mid LAD was  direct stented with 1 drug-eluting stent. ?The distal edge of the lesion was not adequately covered, thus a second drug-eluting stent was placed overlapping the distal edge of the first stent. ? ?  ?Neuro/Psych ?negative neurological ROS ? negative psych ROS  ? GI/Hepatic ?GERD  ,(+)  ?  ? (-) substance abuse ? ,   ?Endo/Other  ?diabetes ? Renal/GU ?CRFRenal disease  ? ?  ?Musculoskeletal ? ?(+) Arthritis ,  ? Abdominal ?  ?Peds ? Hematology ?  ?Anesthesia Other Findings ?Past Medical History: ?No date: Arthritis ?No date: CAD S/P percutaneous coronary angioplasty ?    Comment:  a. s/p BMS stent to RCA ~2000; b. PTCA of ISR in 2001;  ?             c. 09/2013 Cath: LM nl, LAD 1m, LCX min irregs, RI min  ?             irregs, RCA 20p ISR. 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    Comment:  a. 09/2013 Echo: EF 45-50%, no rwma, Gr2 DD, mild AI,  ?             mildly dil LA. ?No date: CKD (chronic kidney disease), stage III ([place]) ?No date: COPD (chronic obstructive pulmonary disease) ([place]) ?No date: Essential hypertension ?No date: GERD (gastroesophageal reflux disease) ?No date: HLD (hyperlipidemia) ?No date: Insomnia ?No date: MI (myocardial infarction) ([place]) ?No date: Thoracic or lumbosacral neuritis or radiculitis, unspecified ?No date: Type II diabetes mellitus ([place]) ? Reproductive/Obstetrics ? ?  ? ? ? ? ? ? ? ? ? ? ? ? ? ?  ?  ? ? ? ? ? ? ? ?Anesthesia Physical ?Anesthesia  Plan ? ?ASA: 3 ? ?Anesthesia Plan: General  ? ?Post-op Pain Management: Minimal or no pain anticipated  ? ?Induction: Intravenous ? ?PONV Risk Score and Plan: 3 and Propofol infusion, TIVA and Ondansetron ? ?Airway Management Planned: Nasal Cannula ? ?Additional Equipment: None ? ?Intra-op Plan:  ? ?Post-operative Plan:  ? ?Informed Consent: I have reviewed the patients History and Physical, chart, labs and discussed the procedure including the risks, benefits and alternatives for the proposed  anesthesia with the patient or authorized representative who has indicated his/her understanding and acceptance.  ? ? ? ?Dental advisory given ? ?Plan Discussed with: CRNA and Surgeon ? ?Anesthesia Plan Comments: (Discussed risks of anesthesia with patient, including possibility of difficulty with spontaneous ventilation under anesthesia necessitating airway intervention, PONV, and rare risks such as cardiac or respiratory or neurological events, and allergic reactions. Discussed the role of CRNA in patient's perioperative care. Patient understands. ?Patient counseled on being higher risk for anesthesia due to comorbidities: recent PCI within 4 months. Patient was told about increased risk of cardiac and respiratory events, including death. ?)  ? ? ? ? ? ? ?Anesthesia Quick Evaluation ? ?

## 2021-08-09 NOTE — Anesthesia Postprocedure Evaluation (Signed)
Anesthesia Post Note ? ?Patient: Tasha Myers ? ?Procedure(s) Performed: COLONOSCOPY WITH PROPOFOL ? ?Patient location during evaluation: Endoscopy ?Anesthesia Type: General ?Level of consciousness: awake and alert ?Pain management: pain level controlled ?Vital Signs Assessment: post-procedure vital signs reviewed and stable ?Respiratory status: spontaneous breathing, nonlabored ventilation, respiratory function stable and patient connected to nasal cannula oxygen ?Cardiovascular status: blood pressure returned to baseline and stable ?Postop Assessment: no apparent nausea or vomiting ?Anesthetic complications: no ? ? ?No notable events documented. ? ? ?Last Vitals:  ?Vitals:  ? 08/09/21 0920 08/09/21 0930  ?BP: 123/64 138/60  ?Pulse: 81 78  ?Resp: (!) 24 19  ?Temp:    ?SpO2: 100% 98%  ?  ?Last Pain:  ?Vitals:  ? 08/09/21 0900  ?TempSrc: Temporal  ?PainSc:   ? ? ?  ?  ?  ?  ?  ?  ? ?Arita Miss ? ? ? ? ?

## 2021-08-09 NOTE — Anesthesia Procedure Notes (Signed)
Procedure Name: [place] ?Date/Time: 08/09/2021 8:40 AM ?Performed by: Tollie Eth, CRNA ?Pre-anesthesia Checklist: Patient identified, Emergency Drugs available, Suction available and Patient being monitored ?Patient Re-evaluated:Patient Re-evaluated prior to induction ?Oxygen Delivery Method: Nasal cannula ?Induction Type: IV induction ?Placement Confirmation: positive ETCO2 ? ? ? ? ?

## 2021-08-09 NOTE — Transfer of Care (Signed)
Immediate Anesthesia Transfer of Care Note ? ?Patient: Tasha Myers ? ?Procedure(s) Performed: COLONOSCOPY WITH PROPOFOL ? ?Patient Location: Endoscopy Unit ? ?Anesthesia Type:General ? ?Level of Consciousness: drowsy ? ?Airway & Oxygen Therapy: Patient Spontanous Breathing and Patient connected to nasal cannula oxygen ? ?Post-op Assessment: Report given to RN and Post -op Vital signs reviewed and stable ? ?Post vital signs: Reviewed and stable ? ?Last Vitals:  ?Vitals Value Taken Time  ?BP 112/61 08/09/21 0908  ?Temp    ?Pulse 71 08/09/21 0908  ?Resp 18 08/09/21 0908  ?SpO2 95 % 08/09/21 0908  ?Vitals shown include unvalidated device data. ? ?Last Pain:  ?Vitals:  ? 08/09/21 0718  ?TempSrc: Temporal  ?PainSc: 0-No pain  ?   ? ?  ? ?Complications: No notable events documented. ?

## 2021-08-10 ENCOUNTER — Encounter: Payer: Self-pay | Admitting: Internal Medicine

## 2022-01-08 ENCOUNTER — Other Ambulatory Visit: Payer: Self-pay | Admitting: *Deleted

## 2022-01-08 DIAGNOSIS — Z122 Encounter for screening for malignant neoplasm of respiratory organs: Secondary | ICD-10-CM

## 2022-01-08 DIAGNOSIS — F17210 Nicotine dependence, cigarettes, uncomplicated: Secondary | ICD-10-CM

## 2022-01-08 DIAGNOSIS — Z87891 Personal history of nicotine dependence: Secondary | ICD-10-CM

## 2022-02-07 ENCOUNTER — Ambulatory Visit (INDEPENDENT_AMBULATORY_CARE_PROVIDER_SITE_OTHER): Payer: Medicare Other | Admitting: Acute Care

## 2022-02-07 ENCOUNTER — Encounter: Payer: Self-pay | Admitting: Acute Care

## 2022-02-07 DIAGNOSIS — F17210 Nicotine dependence, cigarettes, uncomplicated: Secondary | ICD-10-CM

## 2022-02-07 NOTE — Patient Instructions (Signed)
Thank you for participating in the [facility]. It was our pleasure to meet you today. We will call you with the results of your scan within the next few days. Your scan will be assigned a Lung RADS category score by the physicians reading the scans.  This Lung RADS score determines follow up scanning.  See below for description of categories, and follow up screening recommendations. We will be in touch to schedule your follow up screening annually or based on recommendations of our providers. We will fax a copy of your scan results to your Primary Care Physician, or the physician who referred you to the program, to ensure they have the results. Please call the office if you have any questions or concerns regarding your scanning experience or results.  Our office number is [phone]. Please speak with Denise Phelps, RN. , or  Denise Buckner RN, They are  our Lung Cancer Screening RN.'s If They are unavailable when you call, Please leave a message on the voice mail. We will return your call at our earliest convenience.This voice mail is monitored several times a day.  Remember, if your scan is normal, we will scan you annually as long as you continue to meet the criteria for the program. (Age 55-77, Current smoker or smoker who has quit within the last 15 years). If you are a smoker, remember, quitting is the single most powerful action that you can take to decrease your risk of lung cancer and other pulmonary, breathing related problems. We know quitting is hard, and we are here to help.  Please let us know if there is anything we can do to help you meet your goal of quitting. If you are a former smoker, congratulations. We are proud of you! Remain smoke free! Remember you can refer friends or family members through the number above.  We will screen them to make sure they meet criteria for the program. Thank you for helping us take better care of you by  participating in Lung Screening.  You can receive free nicotine replacement therapy ( patches, gum or mints) by calling 1-800-QUIT NOW. Please call so we can get you on the path to becoming  a non-smoker. I know it is hard, but you can do this!  Lung RADS Categories:  Lung RADS 1: no nodules or definitely non-concerning nodules.  Recommendation is for a repeat annual scan in 12 months.  Lung RADS 2:  nodules that are non-concerning in appearance and behavior with a very low likelihood of becoming an active cancer. Recommendation is for a repeat annual scan in 12 months.  Lung RADS 3: nodules that are probably non-concerning , includes nodules with a low likelihood of becoming an active cancer.  Recommendation is for a 6-month repeat screening scan. Often noted after an upper respiratory illness. We will be in touch to make sure you have no questions, and to schedule your 6-month scan.  Lung RADS 4 A: nodules with concerning findings, recommendation is most often for a follow up scan in 3 months or additional testing based on our provider's assessment of the scan. We will be in touch to make sure you have no questions and to schedule the recommended 3 month follow up scan.  Lung RADS 4 B:  indicates findings that are concerning. We will be in touch with you to schedule additional diagnostic testing based on our provider's  assessment of the scan.  Other options for assistance in smoking cessation (   As covered by your insurance benefits)  Hypnosis for smoking cessation  [employer] [phone]  Acupuncture for smoking cessation  [facility] [phone]   

## 2022-02-07 NOTE — Progress Notes (Signed)
Virtual Visit via Telephone Note  I connected with Doran Heater on 02/07/22 at 10:00 AM EDT by telephone and verified that I am speaking with the correct person using two identifiers.  Location: Patient:  At home Provider:  [place], [place], [place], [address]    I discussed the limitations, risks, security and privacy concerns of performing an evaluation and management service by telephone and the availability of in person appointments. I also discussed with the patient that there may be a patient responsible charge related to this service. The patient expressed understanding and agreed to proceed.   Shared Decision Making Visit Lung Cancer Screening Program [phone])   Eligibility: Age 61 y.o. Pack Years Smoking History Calculation 26 pack years (# packs/per year x # years smoked) Recent History of coughing up blood  no Unexplained weight loss? no ( >Than 15 pounds within the last 6 months ) Prior History Lung / other cancer no (Diagnosis within the last 5 years already requiring surveillance chest CT Scans). Smoking Status Current Smoker Former Smokers: Years since quit:  NA  Quit Date:  NA  Visit Components: Discussion included one or more decision making aids. yes Discussion included risk/benefits of screening. yes Discussion included potential follow up diagnostic testing for abnormal scans. yes Discussion included meaning and risk of over diagnosis. yes Discussion included meaning and risk of False Positives. yes Discussion included meaning of total radiation exposure. yes  Counseling Included: Importance of adherence to annual lung cancer LDCT screening. yes Impact of comorbidities on ability to participate in the program. yes Ability and willingness to under diagnostic treatment. yes  Smoking Cessation Counseling: Current Smokers:  Discussed importance of smoking cessation. yes Information about tobacco cessation classes and interventions provided to  patient. yes Patient provided with "ticket" for LDCT Scan. yes Symptomatic Patient. no  Counseling NA Diagnosis Code: Tobacco Use Z72.0 Asymptomatic Patient yes  Counseling (Intermediate counseling: > three minutes counseling) H5456 Former Smokers:  Discussed the importance of maintaining cigarette abstinence. yes Diagnosis Code: Personal History of Nicotine Dependence. Y56.389 Information about tobacco cessation classes and interventions provided to patient. Yes Patient provided with "ticket" for LDCT Scan. yes Written Order for Lung Cancer Screening with LDCT placed in Epic. Yes (CT Chest Lung Cancer Screening Low Dose W/O CM) [id number] Z12.2-Screening of respiratory organs Z87.891-Personal history of nicotine dependence  I have spent 25 minutes of face to face/ virtual visit   time with  Ms. Cura discussing the risks and benefits of lung cancer screening. We viewed / discussed a power point together that explained in detail the above noted topics. We paused at intervals to allow for questions to be asked and answered to ensure understanding.We discussed that the single most powerful action that she can take to decrease her risk of developing lung cancer is to quit smoking. We discussed whether or not she is ready to commit to setting a quit date. We discussed options for tools to aid in quitting smoking including nicotine replacement therapy, non-nicotine medications, support groups, Quit Smart classes, and behavior modification. We discussed that often times setting smaller, more achievable goals, such as eliminating 1 cigarette a day for a week and then 2 cigarettes a day for a week can be helpful in slowly decreasing the number of cigarettes smoked. This allows for a sense of accomplishment as well as providing a clinical benefit. I provided  her  with smoking cessation  information  with contact information for community resources, classes, free nicotine  replacement therapy, and access to  mobile apps, text messaging, and on-line smoking cessation help. I have also provided  her  the office contact information in the event she needs to contact me, or the screening staff. We discussed the time and location of the scan, and that either Doroteo Glassman RN, Joella Prince, RN  or I will call / send a letter with the results within 24-72 hours of receiving them. The patient verbalized understanding of all of  the above and had no further questions upon leaving the office. They have my contact information in the event they have any further questions.  I spent 3 minutes counseling on smoking cessation and the health risks of continued tobacco abuse.  I explained to the patient that there has been a high incidence of coronary artery disease noted on these exams. I explained that this is a non-gated exam therefore degree or severity cannot be determined. This patient is on statin therapy. I have asked the patient to follow-up with their PCP regarding any incidental finding of coronary artery disease and management with diet or medication as their PCP  feels is clinically indicated. The patient verbalized understanding of the above and had no further questions upon completion of the visit.      Magdalen Spatz, NP 02/07/2022

## 2022-02-08 ENCOUNTER — Ambulatory Visit
Admission: RE | Admit: 2022-02-08 | Discharge: 2022-02-08 | Disposition: A | Discharge status: Home / Self Care | Payer: Medicare Other | Source: Ambulatory Visit | Attending: Acute Care | Admitting: Acute Care

## 2022-02-08 DIAGNOSIS — F17210 Nicotine dependence, cigarettes, uncomplicated: Secondary | ICD-10-CM | POA: Diagnosis present

## 2022-02-08 DIAGNOSIS — Z122 Encounter for screening for malignant neoplasm of respiratory organs: Secondary | ICD-10-CM

## 2022-02-08 DIAGNOSIS — Z87891 Personal history of nicotine dependence: Secondary | ICD-10-CM | POA: Diagnosis not present

## 2022-02-12 ENCOUNTER — Other Ambulatory Visit: Payer: Self-pay | Admitting: Acute Care

## 2022-02-12 DIAGNOSIS — Z87891 Personal history of nicotine dependence: Secondary | ICD-10-CM

## 2022-02-12 DIAGNOSIS — Z122 Encounter for screening for malignant neoplasm of respiratory organs: Secondary | ICD-10-CM

## 2022-02-12 DIAGNOSIS — F17210 Nicotine dependence, cigarettes, uncomplicated: Secondary | ICD-10-CM

## 2023-02-12 ENCOUNTER — Ambulatory Visit
Admission: RE | Admit: 2023-02-12 | Discharge: 2023-02-12 | Disposition: A | Discharge status: Home / Self Care | Payer: 59 | Source: Ambulatory Visit | Attending: Family Medicine | Admitting: Family Medicine

## 2023-02-12 DIAGNOSIS — Z122 Encounter for screening for malignant neoplasm of respiratory organs: Secondary | ICD-10-CM | POA: Diagnosis present

## 2023-02-12 DIAGNOSIS — F17210 Nicotine dependence, cigarettes, uncomplicated: Secondary | ICD-10-CM | POA: Diagnosis present

## 2023-02-12 DIAGNOSIS — Z87891 Personal history of nicotine dependence: Secondary | ICD-10-CM | POA: Insufficient documentation

## 2023-03-18 ENCOUNTER — Other Ambulatory Visit: Payer: Self-pay | Admitting: Acute Care

## 2023-03-18 DIAGNOSIS — Z122 Encounter for screening for malignant neoplasm of respiratory organs: Secondary | ICD-10-CM

## 2023-03-18 DIAGNOSIS — F17210 Nicotine dependence, cigarettes, uncomplicated: Secondary | ICD-10-CM

## 2023-03-18 DIAGNOSIS — Z87891 Personal history of nicotine dependence: Secondary | ICD-10-CM

## 2023-09-03 ENCOUNTER — Emergency Department (HOSPITAL_COMMUNITY)
Admission: EM | Admit: 2023-09-03 | Discharge: 2023-09-03 | Disposition: A | Discharge status: Home / Self Care | Attending: Emergency Medicine | Admitting: Emergency Medicine

## 2023-09-03 ENCOUNTER — Other Ambulatory Visit: Payer: Self-pay

## 2023-09-03 ENCOUNTER — Emergency Department (HOSPITAL_COMMUNITY)

## 2023-09-03 ENCOUNTER — Encounter (HOSPITAL_COMMUNITY): Payer: Self-pay | Admitting: Emergency Medicine

## 2023-09-03 DIAGNOSIS — Z7982 Long term (current) use of aspirin: Secondary | ICD-10-CM | POA: Insufficient documentation

## 2023-09-03 DIAGNOSIS — R109 Unspecified abdominal pain: Secondary | ICD-10-CM | POA: Diagnosis present

## 2023-09-03 DIAGNOSIS — N390 Urinary tract infection, site not specified: Secondary | ICD-10-CM | POA: Diagnosis not present

## 2023-09-03 DIAGNOSIS — N189 Chronic kidney disease, unspecified: Secondary | ICD-10-CM | POA: Insufficient documentation

## 2023-09-03 DIAGNOSIS — E1122 Type 2 diabetes mellitus with diabetic chronic kidney disease: Secondary | ICD-10-CM | POA: Diagnosis not present

## 2023-09-03 DIAGNOSIS — D72829 Elevated white blood cell count, unspecified: Secondary | ICD-10-CM | POA: Diagnosis not present

## 2023-09-03 DIAGNOSIS — J449 Chronic obstructive pulmonary disease, unspecified: Secondary | ICD-10-CM | POA: Insufficient documentation

## 2023-09-03 DIAGNOSIS — Z79899 Other long term (current) drug therapy: Secondary | ICD-10-CM | POA: Insufficient documentation

## 2023-09-03 DIAGNOSIS — I129 Hypertensive chronic kidney disease with stage 1 through stage 4 chronic kidney disease, or unspecified chronic kidney disease: Secondary | ICD-10-CM | POA: Diagnosis not present

## 2023-09-03 LAB — URINALYSIS, ROUTINE W REFLEX MICROSCOPIC
Bilirubin Urine: NEGATIVE
Glucose, UA: NEGATIVE mg/dL
Hgb urine dipstick: NEGATIVE
Ketones, ur: NEGATIVE mg/dL
Nitrite: NEGATIVE
Protein, ur: NEGATIVE mg/dL
Specific Gravity, Urine: 1.01 (ref 1.005–1.030)
pH: 5 (ref 5.0–8.0)

## 2023-09-03 LAB — CBC WITH DIFFERENTIAL/PLATELET
Abs Immature Granulocytes: 0.07 10*3/uL (ref 0.00–0.07)
Basophils Absolute: 0.1 10*3/uL (ref 0.0–0.1)
Basophils Relative: 1 %
Eosinophils Absolute: 0.3 10*3/uL (ref 0.0–0.5)
Eosinophils Relative: 2 %
HCT: 42.5 % (ref 36.0–46.0)
Hemoglobin: 13.9 g/dL (ref 12.0–15.0)
Immature Granulocytes: 1 %
Lymphocytes Relative: 16 %
Lymphs Abs: 2.3 10*3/uL (ref 0.7–4.0)
MCH: 31.2 pg (ref 26.0–34.0)
MCHC: 32.7 g/dL (ref 30.0–36.0)
MCV: 95.5 fL (ref 80.0–100.0)
Monocytes Absolute: 0.7 10*3/uL (ref 0.1–1.0)
Monocytes Relative: 5 %
Neutro Abs: 11.1 10*3/uL — ABNORMAL HIGH (ref 1.7–7.7)
Neutrophils Relative %: 75 %
Platelets: 244 10*3/uL (ref 150–400)
RBC: 4.45 MIL/uL (ref 3.87–5.11)
RDW: 15.7 % — ABNORMAL HIGH (ref 11.5–15.5)
WBC: 14.5 10*3/uL — ABNORMAL HIGH (ref 4.0–10.5)
nRBC: 0 % (ref 0.0–0.2)

## 2023-09-03 LAB — COMPREHENSIVE METABOLIC PANEL WITH GFR
ALT: 13 U/L (ref 0–44)
AST: 18 U/L (ref 15–41)
Albumin: 3.2 g/dL — ABNORMAL LOW (ref 3.5–5.0)
Alkaline Phosphatase: 63 U/L (ref 38–126)
Anion gap: 7 (ref 5–15)
BUN: 12 mg/dL (ref 8–23)
CO2: 26 mmol/L (ref 22–32)
Calcium: 9.4 mg/dL (ref 8.9–10.3)
Chloride: 103 mmol/L (ref 98–111)
Creatinine, Ser: 1.2 mg/dL — ABNORMAL HIGH (ref 0.44–1.00)
GFR, Estimated: 50 mL/min — ABNORMAL LOW (ref >60)
Glucose, Bld: 136 mg/dL — ABNORMAL HIGH (ref 70–99)
Potassium: 4.1 mmol/L (ref 3.5–5.1)
Sodium: 136 mmol/L (ref 135–145)
Total Bilirubin: 0.9 mg/dL (ref 0.0–1.2)
Total Protein: 6.4 g/dL — ABNORMAL LOW (ref 6.5–8.1)

## 2023-09-03 LAB — CBC
HCT: 42.1 % (ref 36.0–46.0)
Hemoglobin: 13.8 g/dL (ref 12.0–15.0)
MCH: 31.1 pg (ref 26.0–34.0)
MCHC: 32.8 g/dL (ref 30.0–36.0)
MCV: 94.8 fL (ref 80.0–100.0)
Platelets: 235 10*3/uL (ref 150–400)
RBC: 4.44 MIL/uL (ref 3.87–5.11)
RDW: 15.5 % (ref 11.5–15.5)
WBC: 14.3 10*3/uL — ABNORMAL HIGH (ref 4.0–10.5)
nRBC: 0 % (ref 0.0–0.2)

## 2023-09-03 LAB — LIPASE, BLOOD: Lipase: 34 U/L (ref 11–51)

## 2023-09-03 MED ORDER — CEFUROXIME AXETIL 250 MG PO TABS: 250.0000 mg | ORAL_TABLET | Freq: Two times a day (BID) | ORAL | 0 refills | Status: AC

## 2023-09-03 NOTE — ED Notes (Signed)
 Pt unable to void at this time.  KM

## 2023-09-03 NOTE — Discharge Instructions (Addendum)
 Today you are seen for lower left abdominal and left flank pain.  I suspect this is likely due to a urinary tract infection.  Please pick up your antibiotic and take as prescribed.  Please return to the ED if you have worsening pain, uncontrollable vomiting, or fever.  Thank you for letting us  treat you today. After reviewing your labs and imaging, I feel you are safe to go home. Please follow up with your PCP in the next several days and provide them with your records from this visit. Return to the Emergency Room if pain becomes severe or symptoms worsen.

## 2023-09-03 NOTE — ED Triage Notes (Signed)
 Pt reports lower left abdominal pain that radiated around to her flank and back since Sunday morning.  She denies any other symptoms.

## 2023-09-03 NOTE — ED Provider Notes (Signed)
 [place] EMERGENCY DEPARTMENT AT [facility] Provider Note   CSN: [id number] Arrival date & time: 09/03/23  7829     History  Chief Complaint  Patient presents with   Flank Pain    Tasha Myers is a 63 y.o. female past medical history significant for CKD, diabetes, hypertension, COPD, and GERD presents today for lower left abdominal pain that radiates around to her left flank that began yesterday.  Patient denies dysuria, hematuria, urgency, frequency, nausea, vomiting, fever, chills, diarrhea, constipation, or any other complaints at this time.   Flank Pain Associated symptoms include abdominal pain.       Home Medications Prior to Admission medications   Medication Sig Start Date End Date Taking? Authorizing Provider  cefUROXime (CEFTIN) 250 MG tablet Take 1 tablet (250 mg total) by mouth 2 (two) times daily with a meal for 10 days. 09/03/23 09/13/23 Yes Kirke Breach N, PA-C  albuterol (VENTOLIN HFA) 108 (90 Base) MCG/ACT inhaler Inhale into the lungs every 6 (six) hours as needed for wheezing or shortness of breath.    [provider]  amLODipine (NORVASC) 2.5 MG tablet Take 2.5 mg by mouth daily. 09/04/18   [provider]  APPLE CIDER VINEGAR PO Take 450 mg by mouth daily.    [provider]  aspirin  EC 81 MG EC tablet Take 1 tablet (81 mg total) by mouth daily. 09/11/18   Guilloud, Carolyn, MD  carvedilol  (COREG ) 12.5 MG tablet Take 12.5 mg by mouth 2 (two) times daily with a meal.     [provider]  cholecalciferol (VITAMIN D3) 25 MCG (1000 UT) tablet Take 2,000 Units by mouth daily.     [provider]  clopidogrel  (PLAVIX ) 75 MG tablet TAKE 1 TABLET BY MOUTH ONCE DAILY. Patient taking differently: Take 75 mg by mouth daily. 07/10/17   Gollan, Timothy J, MD  Cyanocobalamin  (VITAMIN B-12) 2500 MCG SUBL Place 2,500 tablets under the tongue daily.    [provider]  furosemide  (LASIX ) 20 MG tablet Take 60 mg by  mouth daily.    [provider]  gabapentin  (NEURONTIN ) 300 MG capsule Take 300 mg by mouth in the morning and at bedtime. 03/08/20   [provider]  glipiZIDE  (GLUCOTROL  XL) 10 MG 24 hr tablet Take 10 mg by mouth 2 (two) times daily.     [provider]  isosorbide  mononitrate (IMDUR ) 30 MG 24 hr tablet Take 30 mg by mouth daily.    [provider]  nitroGLYCERIN  (NITROSTAT ) 0.4 MG SL tablet Place 0.4 mg under the tongue every 5 (five) minutes as needed for chest pain.     [provider]  ondansetron  (ZOFRAN ) 4 MG tablet Take 1 tablet (4 mg total) by mouth every 6 (six) hours. 08/22/20   White, Maybelle Spatz, NP  oxyCODONE  (ROXICODONE ) 15 MG immediate release tablet Take 15 mg by mouth 3 (three) times daily as needed for pain.  08/15/18   [provider]  pantoprazole  (PROTONIX ) 20 MG tablet Take 20 mg by mouth daily.  06/17/17 12/21/20  [provider]  potassium chloride  SA (KLOR-CON ) 20 MEQ tablet Take 20 mEq by mouth daily. 08/26/19   [provider]  rosuvastatin  (CRESTOR ) 20 MG tablet Take 20 mg by mouth daily.  09/04/17   [provider]  sitaGLIPtin (JANUVIA) 50 MG tablet Take 50 mg by mouth daily.  10/15/16 12/21/20  [provider]  umeclidinium-vilanterol (ANORO ELLIPTA) 62.5-25 MCG/ACT AEPB Inhale 1 puff  into the lungs 2 (two) times daily.    [provider]      Allergies    Empagliflozin and Iodinated contrast media    Review of Systems   Review of Systems  Gastrointestinal:  Positive for abdominal pain.  Genitourinary:  Positive for flank pain.    Physical Exam Updated Vital Signs BP 131/62   Pulse 75   Temp 97.7 F (36.5 C)   Resp 16   Ht 5\' 6"  (1.676 m)   Wt 76.2 kg   SpO2 99%   BMI 27.11 kg/m  Physical Exam Vitals and nursing note reviewed.  Constitutional:      General: She is not in acute distress.    Appearance: Normal appearance. She is well-developed. She is not  ill-appearing, toxic-appearing or diaphoretic.  HENT:     Head: Normocephalic and atraumatic.     Right Ear: External ear normal.     Left Ear: External ear normal.     Nose: Nose normal.  Eyes:     Extraocular Movements: Extraocular movements intact.     Conjunctiva/sclera: Conjunctivae normal.  Cardiovascular:     Rate and Rhythm: Normal rate and regular rhythm.     Pulses: Normal pulses.     Heart sounds: Normal heart sounds. No murmur heard. Pulmonary:     Effort: Pulmonary effort is normal. No respiratory distress.     Breath sounds: Normal breath sounds.  Abdominal:     Palpations: Abdomen is soft.     Tenderness: There is abdominal tenderness in the left lower quadrant. There is left CVA tenderness. Negative signs include Murphy's sign, Rovsing's sign and McBurney's sign.  Musculoskeletal:        General: No swelling.     Cervical back: Normal range of motion and neck supple.  Skin:    General: Skin is warm and dry.     Capillary Refill: Capillary refill takes less than 2 seconds.  Neurological:     General: No focal deficit present.     Mental Status: She is alert.  Psychiatric:        Mood and Affect: Mood normal.     ED Results / Procedures / Treatments   Labs (all labs ordered are listed, but only abnormal results are displayed) Labs Reviewed  COMPREHENSIVE METABOLIC PANEL WITH GFR - Abnormal; Notable for the following components:      Result Value   Glucose, Bld 136 (*)    Creatinine, Ser 1.20 (*)    Total Protein 6.4 (*)    Albumin 3.2 (*)    GFR, Estimated 50 (*)    All other components within normal limits  CBC - Abnormal; Notable for the following components:   WBC 14.3 (*)    All other components within normal limits  URINALYSIS, ROUTINE W REFLEX MICROSCOPIC - Abnormal; Notable for the following components:   APPearance HAZY (*)    Leukocytes,Ua SMALL (*)    Bacteria, UA RARE (*)    All other components within normal limits  CBC WITH  DIFFERENTIAL/PLATELET - Abnormal; Notable for the following components:   WBC 14.5 (*)    RDW 15.7 (*)    Neutro Abs 11.1 (*)    All other components within normal limits  URINE CULTURE  LIPASE, BLOOD    EKG None  Radiology CT Renal Stone Study Result Date: 09/03/2023 CLINICAL DATA:  Abdominal pain and flank pain. Evaluate for kidney stone. EXAM: CT ABDOMEN AND PELVIS WITHOUT CONTRAST TECHNIQUE: Multidetector CT imaging  of the abdomen and pelvis was performed following the standard protocol without IV contrast. RADIATION DOSE REDUCTION: This exam was performed according to the departmental dose-optimization program which includes automated exposure control, adjustment of the mA and/or kV according to patient size and/or use of iterative reconstruction technique. COMPARISON:  10/28/2014 FINDINGS: Lower chest: No pleural fluid or consolidative change. Increase interstitial reticulation with ground-glass attenuation identified within both lung bases. Right lower lobe lung nodule is unchanged measuring 5 mm, image 9/5. Hepatobiliary: Scattered calcified granulomas within the liver parenchyma. No suspicious liver abnormality. Gallbladder appears normal. No bile duct dilatation. Pancreas: Unremarkable. No pancreatic ductal dilatation or surrounding inflammatory changes. Spleen: Normal in size without focal abnormality. Adrenals/Urinary Tract: Normal adrenal glands. No nephrolithiasis or obstructive uropathy. Bilateral perinephric soft tissue stranding is identified, nonspecific. No hydroureter. No ureteral calculi identified. Bladder appears normal. Stomach/Bowel: Stomach is within normal limits. Appendix appears normal. No evidence of bowel wall thickening, distention, or inflammatory changes. Vascular/Lymphatic: Aortic atherosclerosis. No abdominopelvic adenopathy. Reproductive: No mass identified. Other: No free fluid or fluid collections. Musculoskeletal: Postoperative changes involving the right  proximal femur. No acute or significant osseous findings. IMPRESSION: 1. No acute findings within the abdomen or pelvis. 2. No evidence for nephrolithiasis or obstructive uropathy. 3. Bilateral perinephric soft tissue stranding is identified, nonspecific. Correlate for any clinical signs or symptoms of pyelonephritis. 4. Increase interstitial reticulation with ground-glass attenuation identified within both lung bases. Findings are nonspecific and may reflect underlying interstitial lung disease. 5. Stable 5 mm right lower lobe lung nodule. See follow up recommendations from lung cancer screening CT dated 02/12/2023. 6.  Aortic Atherosclerosis (ICD10-I70.0). Electronically Signed   By: Kimberley Penman M.D.   On: 09/03/2023 07:58    Procedures Procedures    Medications Ordered in ED Medications - No data to display  ED Course/ Medical Decision Making/ A&P                                 Medical Decision Making Amount and/or Complexity of Data Reviewed Labs: ordered.   This patient presents to the ED for concern of lower left quadrant abdominal and left flank pain differential diagnosis includes kidney stone, diverticulitis, SBO, UTI, constipation   Lab Tests:  I Ordered, and personally interpreted labs.  The pertinent results include: Elevated creatinine 1.2, GFR 50, leukocytosis of 14.3, UA with small leukocytes and rare bacteria.   Imaging Studies ordered:  I ordered imaging studies including CT renal stone study I independently visualized and interpreted imaging which showed bilateral perinephric soft tissue stranding, no acute findings within the abdomen or pelvis. I agree with the radiologist interpretation   Problem List / ED Course:  Considered for admission or further workup however patient's vital signs, physical exam, labs, and imaging were reassuring.  Patient symptoms likely due to UTI.  Patient prescribed 10-day course of antibiotics outpatient.  Patient given return  precautions.  I feel patient safe for discharge at this time.          Final Clinical Impression(s) / ED Diagnoses Final diagnoses:  Urinary tract infection without hematuria, site unspecified  Left flank pain    Rx / DC Orders ED Discharge Orders          Ordered    cefUROXime (CEFTIN) 250 MG tablet  2 times daily with meals        09/03/23 0815  Carie Charity, PA-C 09/03/23 8413    Ballard Bongo, MD 09/04/23 [phone]

## 2023-09-04 LAB — URINE CULTURE

## 2023-11-07 ENCOUNTER — Other Ambulatory Visit: Payer: Self-pay | Admitting: Family Medicine

## 2023-11-07 DIAGNOSIS — Z1231 Encounter for screening mammogram for malignant neoplasm of breast: Secondary | ICD-10-CM

## 2023-11-21 ENCOUNTER — Ambulatory Visit
Admission: RE | Admit: 2023-11-21 | Discharge: 2023-11-21 | Disposition: A | Discharge status: Home / Self Care | Source: Ambulatory Visit | Attending: Family Medicine | Admitting: Family Medicine

## 2023-11-21 DIAGNOSIS — Z1231 Encounter for screening mammogram for malignant neoplasm of breast: Secondary | ICD-10-CM | POA: Diagnosis present

## 2024-01-08 ENCOUNTER — Other Ambulatory Visit: Payer: Self-pay | Admitting: Specialist

## 2024-01-08 DIAGNOSIS — F17210 Nicotine dependence, cigarettes, uncomplicated: Secondary | ICD-10-CM

## 2024-02-10 ENCOUNTER — Emergency Department (HOSPITAL_COMMUNITY)

## 2024-02-10 ENCOUNTER — Emergency Department (HOSPITAL_COMMUNITY)
Admission: EM | Admit: 2024-02-10 | Discharge: 2024-02-10 | Disposition: A | Discharge status: Home / Self Care | Attending: Emergency Medicine | Admitting: Emergency Medicine

## 2024-02-10 ENCOUNTER — Other Ambulatory Visit: Payer: Self-pay

## 2024-02-10 ENCOUNTER — Encounter (HOSPITAL_COMMUNITY): Payer: Self-pay

## 2024-02-10 DIAGNOSIS — Z7902 Long term (current) use of antithrombotics/antiplatelets: Secondary | ICD-10-CM | POA: Insufficient documentation

## 2024-02-10 DIAGNOSIS — Z7982 Long term (current) use of aspirin: Secondary | ICD-10-CM | POA: Diagnosis not present

## 2024-02-10 DIAGNOSIS — R1030 Lower abdominal pain, unspecified: Secondary | ICD-10-CM | POA: Insufficient documentation

## 2024-02-10 DIAGNOSIS — M5416 Radiculopathy, lumbar region: Secondary | ICD-10-CM | POA: Diagnosis not present

## 2024-02-10 DIAGNOSIS — M549 Dorsalgia, unspecified: Secondary | ICD-10-CM | POA: Diagnosis present

## 2024-02-10 DIAGNOSIS — M5410 Radiculopathy, site unspecified: Secondary | ICD-10-CM

## 2024-02-10 LAB — BASIC METABOLIC PANEL WITH GFR
Anion gap: 15 (ref 5–15)
BUN: 11 mg/dL (ref 8–23)
CO2: 22 mmol/L (ref 22–32)
Calcium: 9.1 mg/dL (ref 8.9–10.3)
Chloride: 100 mmol/L (ref 98–111)
Creatinine, Ser: 1.57 mg/dL — ABNORMAL HIGH (ref 0.44–1.00)
GFR, Estimated: 36 mL/min — ABNORMAL LOW (ref >60)
Glucose, Bld: 150 mg/dL — ABNORMAL HIGH (ref 70–99)
Potassium: 4.1 mmol/L (ref 3.5–5.1)
Sodium: 137 mmol/L (ref 135–145)

## 2024-02-10 LAB — CBC
HCT: 43.5 % (ref 36.0–46.0)
Hemoglobin: 14.4 g/dL (ref 12.0–15.0)
MCH: 31.4 pg (ref 26.0–34.0)
MCHC: 33.1 g/dL (ref 30.0–36.0)
MCV: 94.8 fL (ref 80.0–100.0)
Platelets: 262 K/uL (ref 150–400)
RBC: 4.59 MIL/uL (ref 3.87–5.11)
RDW: 15.3 % (ref 11.5–15.5)
WBC: 9.8 K/uL (ref 4.0–10.5)
nRBC: 0 % (ref 0.0–0.2)

## 2024-02-10 MED ORDER — OXYCODONE HCL 5 MG PO TABS
5.0000 mg | ORAL_TABLET | Freq: Once | ORAL | Status: AC
  Administered 2024-02-10: 5 mg via ORAL
  Filled 2024-02-10: qty 1

## 2024-02-10 MED ORDER — KETOROLAC TROMETHAMINE 15 MG/ML IJ SOLN
15.0000 mg | Freq: Once | INTRAMUSCULAR | Status: AC
  Administered 2024-02-10: 15 mg via INTRAMUSCULAR
  Filled 2024-02-10: qty 1

## 2024-02-10 MED ORDER — DIAZEPAM 5 MG PO TABS
5.0000 mg | ORAL_TABLET | Freq: Once | ORAL | Status: AC
  Administered 2024-02-10: 5 mg via ORAL
  Filled 2024-02-10: qty 1

## 2024-02-10 MED ORDER — ACETAMINOPHEN 500 MG PO TABS
1000.0000 mg | ORAL_TABLET | Freq: Once | ORAL | Status: DC
  Filled 2024-02-10: qty 2

## 2024-02-10 NOTE — ED Triage Notes (Signed)
 Pt arrived from home via POV c/o left flank pain 10/10 that began at 2200 this evening. Pt states that she took an oxycodone  at home at approx 2235 with no relief

## 2024-02-10 NOTE — ED Provider Notes (Signed)
 [place] EMERGENCY DEPARTMENT AT [facility] Provider Note   CSN: [id number] Arrival date & time: 02/10/24  0021     Patient presents with: Flank Pain   Tasha Myers is a 63 y.o. female.   63 yo F with a chief complaints of left sided back pain that radiates to the groin.  This been going on for a few hours now.  Constant nothing seems to make it better or worse.  Denies trauma denies loss of bowel or bladder.  She did feel going to her left thigh for a bit.  She tried an oxycodone  at home but without improvement.   Flank Pain       Prior to Admission medications   Medication Sig Start Date End Date Taking? Authorizing Provider  albuterol (VENTOLIN HFA) 108 (90 Base) MCG/ACT inhaler Inhale into the lungs every 6 (six) hours as needed for wheezing or shortness of breath.    [provider]  amLODipine (NORVASC) 2.5 MG tablet Take 2.5 mg by mouth daily. 09/04/18   [provider]  APPLE CIDER VINEGAR PO Take 450 mg by mouth daily.    [provider]  aspirin  EC 81 MG EC tablet Take 1 tablet (81 mg total) by mouth daily. 09/11/18   Guilloud, Carolyn, MD  carvedilol  (COREG ) 12.5 MG tablet Take 12.5 mg by mouth 2 (two) times daily with a meal.     [provider]  cholecalciferol (VITAMIN D3) 25 MCG (1000 UT) tablet Take 2,000 Units by mouth daily.     [provider]  clopidogrel  (PLAVIX ) 75 MG tablet TAKE 1 TABLET BY MOUTH ONCE DAILY. Patient taking differently: Take 75 mg by mouth daily. 07/10/17   Gollan, Timothy J, MD  Cyanocobalamin  (VITAMIN B-12) 2500 MCG SUBL Place 2,500 tablets under the tongue daily.    [provider]  furosemide  (LASIX ) 20 MG tablet Take 60 mg by mouth daily.    [provider]  gabapentin  (NEURONTIN ) 300 MG capsule Take 300 mg by mouth in the morning and at bedtime. 03/08/20   [provider]  glipiZIDE  (GLUCOTROL  XL) 10 MG 24 hr tablet Take 10 mg by mouth 2 (two) times  daily.     [provider]  isosorbide  mononitrate (IMDUR ) 30 MG 24 hr tablet Take 30 mg by mouth daily.    [provider]  nitroGLYCERIN  (NITROSTAT ) 0.4 MG SL tablet Place 0.4 mg under the tongue every 5 (five) minutes as needed for chest pain.     [provider]  ondansetron  (ZOFRAN ) 4 MG tablet Take 1 tablet (4 mg total) by mouth every 6 (six) hours. 08/22/20   White, Shelba SAUNDERS, NP  oxyCODONE  (ROXICODONE ) 15 MG immediate release tablet Take 15 mg by mouth 3 (three) times daily as needed for pain.  08/15/18   [provider]  pantoprazole  (PROTONIX ) 20 MG tablet Take 20 mg by mouth daily.  06/17/17 12/21/20  [provider]  potassium chloride  SA (KLOR-CON ) 20 MEQ tablet Take 20 mEq by mouth daily. 08/26/19   [provider]  rosuvastatin  (CRESTOR ) 20 MG tablet Take 20 mg by mouth daily.  09/04/17   [provider]  sitaGLIPtin (JANUVIA) 50 MG tablet Take 50 mg by mouth daily.  10/15/16 12/21/20  [provider]  umeclidinium-vilanterol (ANORO ELLIPTA) 62.5-25 MCG/ACT AEPB Inhale 1 puff into the lungs 2 (two) times daily.    [provider]    Allergies: Empagliflozin and Iodinated contrast media  Review of Systems  Genitourinary:  Positive for flank pain.    Updated Vital Signs BP (!) 120/58   Pulse 89   Temp 98.4 F (36.9 C) (Oral)   Resp 18   Ht 5' 6 (1.676 m)   Wt 63.5 kg   SpO2 93%   BMI 22.60 kg/m   Physical Exam Vitals and nursing note reviewed.  Constitutional:      General: She is not in acute distress.    Appearance: She is well-developed. She is not diaphoretic.  HENT:     Head: Normocephalic and atraumatic.  Eyes:     Pupils: Pupils are equal, round, and reactive to light.  Cardiovascular:     Rate and Rhythm: Normal rate and regular rhythm.     Heart sounds: No murmur heard.    No friction rub. No gallop.  Pulmonary:     Effort: Pulmonary effort is normal.     Breath sounds: No  wheezing or rales.  Abdominal:     General: There is no distension.     Palpations: Abdomen is soft.     Tenderness: There is no abdominal tenderness.  Musculoskeletal:        General: No tenderness.     Cervical back: Normal range of motion and neck supple.     Comments: Pulse motor and sensation intact to the left lower extremity.  Reflexes are 2+ and equal.  No clonus.  Skin:    General: Skin is warm and dry.  Neurological:     Mental Status: She is alert and oriented to person, place, and time.  Psychiatric:        Behavior: Behavior normal.     (all labs ordered are listed, but only abnormal results are displayed) Labs Reviewed  BASIC METABOLIC PANEL WITH GFR - Abnormal; Notable for the following components:      Result Value   Glucose, Bld 150 (*)    Creatinine, Ser 1.57 (*)    GFR, Estimated 36 (*)    All other components within normal limits  CBC  URINALYSIS, ROUTINE W REFLEX MICROSCOPIC    EKG: None  Radiology: CT L-SPINE NO CHARGE Result Date: 02/10/2024 EXAM: CT OF THE LUMBAR SPINE WITHOUT CONTRAST 02/10/2024 03:30:00 AM TECHNIQUE: CT of the lumbar spine was performed without the administration of intravenous contrast. Multiplanar reformatted images are provided for review. Automated exposure control, iterative reconstruction, and/or weight based adjustment of the mA/kV was utilized to reduce the radiation dose to as low as reasonably achievable. COMPARISON: None available. CLINICAL HISTORY: Chief complaints; Flank Pain; CT L-SPINE NO CHARGE FINDINGS: BONES AND ALIGNMENT: Normal vertebral body heights. No acute fracture or suspicious bone lesion. Normal alignment. DEGENERATIVE CHANGES: Mild degenerative changes with anterior osteophytosis. SOFT TISSUES: No acute abnormality. IMPRESSION: 1. No acute osseous abnormality. Electronically signed by: Pinkie Pebbles MD 02/10/2024 03:33 AM EDT RP Workstation: [id number]   CT Renal Stone Study Result Date: 02/10/2024 EXAM:  CT UROGRAM 02/10/2024 03:10:00 AM TECHNIQUE: CT of the abdomen and pelvis was performed without the administration of intravenous contrast as per CT urogram protocol. Multiplanar reformatted images as well as MIP urogram images are provided for review. Automated exposure control, iterative reconstruction, and/or weight based adjustment of the mA/kV was utilized to reduce the radiation dose to as low as reasonably achievable. COMPARISON: 09/03/2023 and partial comparison to CT chest dated 02/12/2023. CLINICAL HISTORY: Abdominal/flank pain, stone suspected. Chief complaints; Flank Pain; CT Renal Stone Study; Abdominal/flank pain, Stone suspected. FINDINGS: LOWER CHEST: Intralobular  septal thickening with perilymphatic ground glass opacity in the lower lobes, related to sarcoidosis when correlating with prior chest CT. Two associated 6-7 mm nodules in the right lower lobe (images 10 and 11), similar to priors. LIVER: Calcified hepatic granulomata. GALLBLADDER AND BILE DUCTS: Gallbladder is unremarkable. No biliary ductal dilatation. SPLEEN: No acute abnormality. PANCREAS: No acute abnormality. ADRENAL GLANDS: No acute abnormality. KIDNEYS, URETERS AND BLADDER: No stones in the kidneys or ureters. No hydronephrosis. No perinephric or periureteral stranding. Urinary bladder is unremarkable. GI AND BOWEL: Stomach demonstrates no acute abnormality. There is no bowel obstruction. Normal appendix (image 65). Very mild sigmoid diverticulosis, without evidence of diverticulitis. PERITONEUM AND RETROPERITONEUM: No ascites. No free air. VASCULATURE: Aorta is normal in caliber. Atherosclerotic calcifications of the abdominal aorta and branch vessels. LYMPH NODES: No lymphadenopathy. REPRODUCTIVE ORGANS: Status post hysterectomy. BONES AND SOFT TISSUES: No acute osseous abnormality. Status post ORIF of the right hip. Mild degenerative changes of the lumbar spine. No focal soft tissue abnormality. IMPRESSION: 1. No acute findings  in the abdomen/pelvis. 2. Stable sequela of sarcoidosis at the lung bases, better evaluated on prior CT chest. Electronically signed by: Pinkie Pebbles MD 02/10/2024 03:21 AM EDT RP Workstation: [id number]     Procedures   Medications Ordered in the ED  acetaminophen  (TYLENOL ) tablet 1,000 mg (1,000 mg Oral Patient Refused/Not Given 02/10/24 0408)  ketorolac (TORADOL) 15 MG/ML injection 15 mg (15 mg Intramuscular Given 02/10/24 0410)  oxyCODONE  (Oxy IR/ROXICODONE ) immediate release tablet 5 mg (5 mg Oral Given 02/10/24 0409)  diazepam (VALIUM) tablet 5 mg (5 mg Oral Given 02/10/24 0409)                                    Medical Decision Making Amount and/or Complexity of Data Reviewed Labs: ordered. Radiology: ordered.  Risk OTC drugs. Prescription drug management.   63 yo F with a chief complaints of left-sided low back pain that radiates down the leg.  This has been going on for about 4 hours now.  She says nothing makes it worse but when she was sitting up in bed for exam it was worse for her.  I think most likely musculoskeletal.  Will obtain CT imaging to assess for possible intra-abdominal or intraspinal pathology.  Treat symptoms.  Patient tells me she is mildly better but still having discomfort.  No anemia, no significant electrolyte abnormalities.  CT imaging without obvious acute intra-abdominal or intraspinal pathology.  Most likely radicular back pain.  Discussed treatment with her.  Will have her follow-up with her family doctor.  She tells me she has an appointment this morning.  4:50 AM:  I have discussed the diagnosis/risks/treatment options with the patient.  Evaluation and diagnostic testing in the emergency department does not suggest an emergent condition requiring admission or immediate intervention beyond what has been performed at this time.  They will follow up with PCP. We also discussed returning to the ED immediately if new or worsening sx occur. We  discussed the sx which are most concerning (e.g., sudden worsening pain, fever, inability to tolerate by mouth, cauda equina s/sx) that necessitate immediate return. Medications administered to the patient during their visit and any new prescriptions provided to the patient are listed below.  Medications given during this visit Medications  acetaminophen  (TYLENOL ) tablet 1,000 mg (1,000 mg Oral Patient Refused/Not Given 02/10/24 0408)  ketorolac (TORADOL) 15 MG/ML injection 15 mg (15  mg Intramuscular Given 02/10/24 0410)  oxyCODONE  (Oxy IR/ROXICODONE ) immediate release tablet 5 mg (5 mg Oral Given 02/10/24 0409)  diazepam (VALIUM) tablet 5 mg (5 mg Oral Given 02/10/24 0409)     The patient appears reasonably screen and/or stabilized for discharge and I doubt any other medical condition or other [facility] requiring further screening, evaluation, or treatment in the ED at this time prior to discharge.       Final diagnoses:  Radicular low back pain    ED Discharge Orders     None          Emil Share, DO 02/10/24 9549

## 2024-02-10 NOTE — Discharge Instructions (Signed)

## 2024-02-10 NOTE — ED Notes (Signed)
 Pt unable to give urine sample In triage

## 2024-02-13 ENCOUNTER — Ambulatory Visit
Admission: RE | Admit: 2024-02-13 | Discharge: 2024-02-13 | Disposition: A | Discharge status: Home / Self Care | Source: Ambulatory Visit | Attending: Specialist | Admitting: Specialist

## 2024-02-13 DIAGNOSIS — F17210 Nicotine dependence, cigarettes, uncomplicated: Secondary | ICD-10-CM | POA: Diagnosis present

## 2024-02-24 NOTE — Progress Notes (Signed)
 [facility] Rising [facility]  Care Management -  Follow-Up Engagement  Purpose: A [facility] Care Coordinator completed a phone call to address follow-up care needs.  Tasha Myers is being managed for the following: Health Related Social Needs [facility]).  The Care Manager states this pt is in need of rental, food, and utility resources for [facility]. Follow-up appt scheduled for 02/27/2024 to confirm receipt.  Assessment and Intervention of Needs: [facility] Assessment not applicable.   Plan: Care Plan Discussed - No Next follow up appointment date: 02/27/2024 [facility]    [address]; [place], [place] [address] l  [email or web address] l [phone]   For more information on [facility] services, click here.

## 2024-03-04 NOTE — Progress Notes (Signed)
 [facility] Rising Risk  Care Management -  Follow-Up Engagement  Purpose: A [name] Care Coordinator completed a phone call to address follow-up care needs.  Tasha Myers is being managed for the following: Health Related Social Needs [name]).  The patient states they received rental, food, and utility resources for [name] previously sent and CM appt on 03/10/2024 is confirmed.  Assessment and Intervention of Needs: [facility] Assessment not applicable.   Plan: Care Plan Discussed - No Next follow up appointment date: 03/10/2024 [name]    [address]; [place], [place] [address] l  [email or web address] l [phone]   For more information on [facility] services, click here.

## 2024-03-04 NOTE — Care Plan (Signed)
  Problem: The 5 Core HRSN/SDOH domains Goal: Physicist, medical strain [facility]) - seek recommended services to reduce SDOH barriers Description: Patient's SmartGoal:  Patient will receive community resources to assist with past-due rent by the expected end date Intervention: Financial - assist with tasks to reduce barriers to care Description: Please provide information on community resources that help with past-due rent in the [place], [place] [address] area [place])  Thanks,  Bari Fair, LCSW [facility] Note: CC called pt regarding rental, food, and utility resources for [facility] to confirm preferred delivery method; call was disconnected. Pt called CC back and confirmed they have received the resources previously sent. CM appt on 03/10/2024 confirmed. Intervention complete; no further action needed. -- Christina Goal: Food insecurity (DWSDOH) - seek recommended services to reduce SDOH barriers Description: Patient's SmartGoal:  Patient will receive food pantry by the expected end date Intervention: Food - assist with tasks to reduce barriers to care Description: Please provide a list of food pantries near [place], [place] [address] [facility])  Thanks,  Bari Fair, [place] [facility] Note: CC called pt regarding rental, food, and utility resources for [facility] to confirm preferred delivery method; call was disconnected. Pt called CC back and confirmed they have received the resources previously sent. CM appt on 03/10/2024 confirmed. Intervention complete; no further action needed. -- Christina Goal: Utilities (DWSDOH) - seek recommended services to reduce SDOH barriers Description: Patient's SmartGoal:  Patient will receive information on community resources that help with utility expenses by the expected end date Intervention: Utilities - assist with tasks to reduce barriers to care Description: Please provide information on community resources  that help with utilities in [place], [place] [address] [facility] [place])  Thanks,  Bari Fair, LCSW [facility] Care Manager Note: CC called pt regarding rental, food, and utility resources for [facility] to confirm preferred delivery method; call was disconnected. Pt called CC back and confirmed they have received the resources previously sent. CM appt on 03/10/2024 confirmed. Intervention complete; no further action needed. -- Tawni

## 2024-03-10 NOTE — Progress Notes (Signed)
 [facility] Rising Risk  Care Management -  Follow-Up Engagement  Purpose: A [facility] completed a phone call to address follow-up care needs.  CYMONE YESKE is being managed for the following: Health Related Social Needs [facility]) and healthy lifestyle.  The patient states she received the community resources that were sent by DW Care Coordinator to help with rent, utilities and food.  She has utilized [place] and has received food, which she reports has been helpful and allowed her to add in a daily snack.  She plans to apply for LIEAP through DSS for help with her power bill.   Patient denies having any additional goals at this time and she would like to graduate from care management.  Patient is aware she can re-engage in [facility] care management should needs arise. Assessment and Intervention of Needs: [facility] Assessment completed.   Plan: Care Plan Discussed - Yes - ELINOR KLEINE has agreed to participate with the identified care plan that was discussed on 03/10/2024. Please see Plan of Care in the Notes section of Chart Review  Next follow up appointment date: n/a [facility]    [address]; [place], [place] [address] l  [email or web address] l [phone]   For more information on [facility] services, click here.

## 2024-03-10 NOTE — Care Plan (Signed)
  Problem: DWMED Medications Goal: Verbalize understanding of medication adherence (DWMED) Description: Patient's SmartGoal:  Patient will have a 1st and 2nd level medication review to ensure an accurate medication list by the expected end date Outcome: Met/ Completed Note: Medication reviews completed. Intervention: Complete medication reconciliation Description: CM will complete a medication review and will notify PCP of any medication discrepancies/concerns.  CM will cc chart to DW RN TL Pool for 2nd level med rec. Note: DW RN TL completed a 2nd level med rec with no further action recommended Goal: Verbalize understanding of medication adherence (DWMED) Description: Patient's SmartGoal:   Patient will have her medication list reviewed by a [facility] Pharmacist to ensure all medications are renally dosed and to assist with any cost-savings, by the expected end date Outcome: Met/ Completed Note: DW PharmD outreached patient.  No further action recommended.   Problem: The 5 Core HRSN/SDOH domains Goal: Physicist, medical strain [facility]) - seek recommended services to reduce SDOH barriers Description: Patient's SmartGoal:  Patient will receive community resources to assist with past-due rent by the expected end date Outcome: Met/ Completed Flowsheets (Taken 03/10/2024 0930) Was the Financial connection resolved?: Y Was a Financial connection made?: Yes -Resources sent to patient Note: Patient confirms receipt of community resources to help with rent. Goal: Food insecurity (DWSDOH) - seek recommended services to reduce SDOH barriers Description: Patient's SmartGoal:  Patient will receive food pantry by the expected end date Outcome: Met/ Completed Flowsheets (Taken 03/10/2024 0930) Was a Food connection made?: Yes- Resources sent to patient Was the Food connection resolved?: Y Note: Patient confirms receipt of the food resources.  She has been able to get food from some of the food pantries,  which has been helpful for her. Goal: Utilities [facility]) - seek recommended services to reduce SDOH barriers Description: Patient's SmartGoal:  Patient will receive information on community resources that help with utility expenses by the expected end date Outcome: Met/ Completed Flowsheets (Taken 03/10/2024 0930) Was a Utility connection made?: Yes - Resources sent to patient Was the Utility connection resolved?: Y Note: Patient confirms receipt of community resources to help with utility costs.  She plans to apply for LIEAP through DSS for help with her power bill.     Problem: Healthy Lifestyle Goal: Identify healthy lifestyle adjustments (DWHEALTHY) Description: Patient's SmartGoal:  Patient will incorporate a daily snack by the expected end date Outcome: Met/ Completed Note: Patient reports she has successfully incorporated a daily snack since the last call with CM, with the help of food received from the food pantries. Intervention: Educate patient on benefits of healthy eating Description: CM notified PCP of patient's report of unintentional weight loss and poor appetite.  Patient reports only eating one meal per day.  CM encouraged patient to eat smaller, more frequent meals or snacks. Note: CM commended patient for successfully adding a snack and utilizing the food pantries.

## 2024-03-15 NOTE — ED Triage Notes (Signed)
 Pt with pain to her back and abdomen with nausea. Pt unable to answer questions.

## 2024-03-16 NOTE — Nursing Note (Signed)
 Primary RN states patient is aaox4. Pt became agitated / irritable this afternoon for reasons that are unclear to this clinical research associate. Pt told primary RN that she was leaving and proceeded to exit the unit. Code walker was called overhead. MD team and hospitals police met patient in hospital lobby and attempted to dissuade her from leaving. Pts husband also present and attempted to dissuade pt from leaving. Pt exited safely through front doors. Husband, MD team, house supervisor, hospital police, and RN aware.

## 2024-03-16 NOTE — Nursing Note (Signed)
 Oxygen saturation on room air with patient at rest  - not applicable Oxygen saturation on room air with exertion / ambulation - not applicable Oxygen saturation with exertion / ambulating on oxygen = 87% on 5 lpm    Full walk test unable to be completed due to lack of patient participation.

## 2024-03-16 NOTE — Care Plan (Signed)
  Problem: Gas Exchange Impaired Goal: Optimal Gas Exchange Outcome: Progressing  Received on documented s/t bipap settings, titrated throughout the shift to 5L nasal cannula, inhaled meds given as ordered without adverse reaction, no complications or distress noted this shift.

## 2024-03-16 NOTE — Nursing Note (Signed)
-------------------------------------------------------------------------------   Summary: Provided assistance as Ms. [place] was leaving facility Against Medical Advice -------------------------------------------------------------------------------  Spiritual Care Visit Note  Assessment Summary:  Provided assistance as Ms. Agent was leaving facility AMA (Against Medical Advice).  I followed Ms. Palazzola into the parking lot to be sure she was safe, and it was very cold outside to be wearing her hospital gown.  Her husband was present, tried to persuade her to stay but brought her home. She did not have an IV line in her arm, and she showed the police officer also came by to check her safety.  The patient could benefit from continuing emotional, physical and spiritual support and encouragement. The chaplain visit was appreciated where empathetic and compassionate listening and presence were offered.  Clinical Encounter Type Type of Visit: Initial visit Care Provided To: Patient Referral Source: Other (Code Walker) On-Call Visit?: No Reason for Visit: Behavioral response Minutes Spent: 15 minutes     Spiritual Assessment Faith: None, None Given Presenting Concern(s): Meaning/Purpose Beliefs: personal values, beliefs and ethics Emotions: angry, frustrated Community: Family Needs: privacy, treatment for rhinovirus Hopes: she feels calmer, without symptoms Resources: family, community, medical team  Spiritual Care Interventions Interventions Made: Established relationship of care and support, Compassionate presence, Reflective listening, Non-anxious presence, Normalization of Emotions Outcomes: Appreciated Chaplain visit, Open to continued care    Spiritual Care Plan Spiritual Care Plan: No active spiritual needs at this time. Please consult if new needs emerge.   Signed: Rock Ripper, Chaplain 3:10 PM 03/16/2024

## 2024-03-16 NOTE — Consults (Signed)
 Care Management Initial Transition Planning Assessment CM spoke with patient at bedside to explain role and transition of care. Patient lives alone in private home with 0 steps to enter and resides on main floor. Patient was fully independent prior to hospitalization. No care giver or home care services identified. DMEs in home include WC. Dtr will transport patient home when ready for discharge.    Type of Residence: Mailing Address:  [address] [place] [address] Contacts: Accompanied by: Alone Patient Phone [phone]     Medical Provider(s): Rudy Alyce Hamilton, MD Reason for Admission: Admitting Diagnosis:  Respiratory distress [R06.03] COPD exacerbation    (CMS-HCC) [J44.1] Rhinovirus infection [B34.8] Past Medical History:   has no past medical history on file. Past Surgical History:   has no past surgical history on file.  Previous admit date: N/A  Editor, Commissioning- Payor: ADVERTISING COPYWRITER MEDICARE ADV / Plan: UNITED HEALTHCARE DUAL COMPLETE HMO / Product Type: *No Product type* /  Secondary Insurance - Secondary Insurance  MEDICAID [place] Prescription Coverage -  Preferred Pharmacy - [facility] [place] [place] - [address]  Transportation home: Art Gallery Manager / Social Worker assessed the patient by : In person interview with patient Orientation Level: Oriented X4 Functional level prior to admission: Independent Reason for referral: Discharge Planning  Contact/Decision Maker Extended Emergency Contact Information Primary Emergency Contact: cobb,jennifer Mobile Phone: [phone] Relation: Daughter  Legal Next of Kin / Guardian / POA / Advance Directives    Advance Directive (Medical Treatment) Does patient have an advance directive covering medical treatment?: Patient does not have advance directive covering medical treatment. Reason patient does not have an advance directive covering medical  treatment:: Patient does not wish to complete one at this time.  Health Care Decision Maker [HCDM] (Medical & Mental Health Treatment) Healthcare Decision Maker: Patient does not wish to appoint a Health Care Decision Maker at this time Information offered on HCDM, Medical & Mental Health advance directives:: Patient declined information.  Advance Directive (Mental Health Treatment) Does patient have an advance directive covering mental health treatment?: Patient does not have advance directive covering mental health treatment. Reason patient does not have an advance directive covering mental health treatment:: Patient does not wish to complete one at this time.  Readmission Information  Have you been hospitalized in the last 30 days?: No   Did the following happen with your discharge?   Patient Information Lives with: Alone  Type of Residence: Private residence   Support Systems/Concerns: Children  Responsibilities/Dependents at home?: No  Home Care services in place prior to admission?: No      Outpatient/Community Resources in place prior to admission: Clinic    Equipment Currently Used at Home: wheelchair, manual    Currently receiving outpatient dialysis?: No    Financial Information    Need for financial assistance?: No    Social Drivers of Health Social Drivers of Health were addressed in provider documentation.  Please refer to patient history. Social Drivers of Health   Food Insecurity: Food Insecurity Present (02/17/2024)   Received from [facility]   Hunger Vital Sign   . Within the past 12 months, you worried that your food would run out before you got the money to buy more.: Often true   . Within the past 12 months, the food you bought just didn't last and you didn't have money to  get more.: Often true  Tobacco Use: High Risk (03/16/2024)   Patient History   . Smoking Tobacco Use: Every Day   . Smokeless Tobacco Use: Never   .  Passive Exposure: Not on file  Transportation Needs: No Transportation Needs (02/17/2024)   Received from [facility]   [facility] - Transportation   . In the past 12 months, has lack of transportation kept you from medical appointments or from getting medications?: No   . Lack of Transportation (Non-Medical): No  Alcohol Use: Alcohol Misuse (02/17/2024)   Received from [facility]   AUDIT-C   . Q1: How often do you have a drink containing alcohol?: 2-4 times a month   . Q2: How many drinks containing alcohol do you have on a typical day when you are drinking?: 5 or 6   . Q3: How often do you have six or more drinks on one occasion?: Monthly  Housing: High Risk (02/17/2024)   Received from [facility] Stability Vital Sign   . In the last 12 months, was there a time when you were not able to pay the mortgage or rent on time?: Yes   . In the past 12 months, how many times have you moved where you were living?: 0   . At any time in the past 12 months, were you homeless or living in a shelter (including now)?: No  Physical Activity: Inactive (02/17/2024)   Received from [facility]   Exercise Vital Sign   . On average, how many days per week do you engage in moderate to strenuous exercise (like a brisk walk)?: 0 days   . On average, how many minutes do you engage in exercise at this level?: 0 min  Utilities: Not At Risk (02/17/2024)   Received from [facility] Utilities   . In the past 12 months has the electric, gas, oil, or water company threatened to shut off services in your home?: No  Stress: Stress Concern Present (02/17/2024)   Received from [facility] - Occupational Stress Questionnaire   . Feeling of Stress : Very much  Interpersonal Safety: Not At Risk (03/16/2024)   Interpersonal Safety   . Unsafe Where You Currently  Live: No   . Physically Hurt by Anyone: No   . Abused by Anyone: No  Substance Use: Not on file (03/15/2024)  Intimate Partner Violence: Not on file  Social Connections: Moderately Isolated (02/17/2024)   Received [facility]   Social Connection and Isolation Panel   . In a typical week, how many times do you talk on the phone with family, friends, or neighbors?: More than three times a week   . Frequency of Social Gatherings with Friends and Family: Not on file   . How often do you attend church or religious services?: More than 4 times per year   . Do you belong to any clubs or organizations such as church groups, unions, fraternal or athletic groups, or school groups?: No   . How often do you attend meetings of the clubs or organizations you belong to?: Never   . Are you married, widowed, divorced, separated, never married, or living with a partner?: Separated  Physicist, Medical Strain: High Risk (02/17/2024)   Received from [facility]   Overall Financial Resource Strain (CARDIA)   . Difficulty  of Paying Living Expenses: Very hard  Health Literacy: Inadequate Health Literacy (02/17/2024)   Received from [facility]   B1300 Health Literacy   . How often do you need to have someone help you when you read instructions, pamphlets, or other written material from your doctor or pharmacy?: Sometimes  Internet Connectivity: Not on file    Complex Discharge Information  Is patient identified as a difficult/complex discharge?: No   Interventions:    Discharge Needs Assessment Concerns to be Addressed: discharge planning  Clinical Risk Factors: New Diagnosis, > 65  Barriers to taking medications: No  Prior overnight hospital stay or ED visit in last 90 days: No   Anticipated Changes Related to Illness: none  Equipment Needed After Discharge: other (see comments) (TBD)  Discharge Facility/Level of Care Needs: other (see  comments) (TBD)  Readmission Risk of Unplanned Readmission Score:  % Predictive Model Details  No score data available for [facility] Risk of Unplanned Readmission   Readmitted Within the Last 30 Days? (No if blank)  Patient at risk for readmission?: No  Discharge Plan Screen findings are: Discharge planning needs identified or anticipated (Comment). (TBD)  Expected Discharge Date: 03/16/2024  Expected Transfer from Critical Care:  (N/A)  Quality data for continuing care services shared with patient and/or representative?: Yes Patient and/or family were provided with choice of facilities / services that are available and appropriate to meet post hospital care needs?: Yes  List choices in order highest to lowest preferred, if applicable. : Patient declining HH services  Initial Assessment complete?: Yes

## 2024-03-16 NOTE — Care Plan (Signed)
  Care Management Final Transition Planning Assessment [address] [place] [address] [phone]   03/16/2024        Has a PCP appointment been made?: N/A  No future appointments.  Has a specialist appointment been made?: N/A   Post Acute Facility needed at discharge?: No        Home Care/ Home Medical Equipment needed at discharge?: No           Outpatient/Community Referrals needed for discharge?: No       Transportation Anticipated: family or friend will provide    Currently receiving outpatient dialysis?: No       Discharge Disposition: Against Medical Advice   Quality data for continuing care services shared with patient and/or representative?: Yes Patient and/or family were provided with choice of facilities / services that are available and appropriate to meet post hospital care needs?: Yes  List choices in order highest to lowest preferred, if applicable. : Patient declining HH services     Final Assessment Complete: Yes  Readmission Risk Score:  Predictive Model Details  No score data available for [facility] Risk of Unplanned Readmission

## 2024-03-16 NOTE — Consults (Signed)
 SW received tobacco cessation consult. Pt declined to engage and hung up after introductions stating she is not interested in talking about her smoking. Pt would not quantify how many cigarettes she has per day and states she wants to go home and has no patience or desire to discuss further and ended call. SW provided pt with contact information, physical improvements related to tobacco cessation, and available resources (including outpatient Tobacco Treatment Program at [facility] and [place] [facility]) via discharge instructions.  Julie Hartzell, LCSW, NCTTP Clinical Social Worker / Tobacco Treatment Specialist [facility] Phone: [phone].

## 2024-03-16 NOTE — Progress Notes (Signed)
 ------------------------------------------------------------------------------- Attestation signed by [facility], Isaiah Fellows, MD at 03/16/24 1506 Daily Progress Attestation: I saw and evaluated the patient, participating in the key portions of the service.  I reviewed the resident's note.  I agree with the resident's findings and plan.  I personally spent 36 minutes face-to-face and non-face-to-face in the care of this patient, which includes all pre, intra, and post visit time on the date of service. All documented time was specific to E/M and does not include any procedures that may have been performed.    Isaiah KANDICE Coyer, MD      -------------------------------------------------------------------------------  Family Medicine Inpatient Progress Note  Assessment & Plan:  Tasha Myers is a 63 y.o. female whose presentation is complicated by COPD, ILD vs Sarcoid, CHF, CKD, T2DM, chronic back pain that presented to [facility] Emergency Department with respiratory distress.   Principal Problem:   COPD exacerbation    (CMS-HCC) Active Problems:   Chronic combined systolic and diastolic CHF (congestive heart failure) (CMS-HCC)   Type 2 diabetes mellitus with stage 3 chronic kidney disease, without long-term current use of insulin  (CMS-HCC)   Essential hypertension   ILD (interstitial lung disease)    (CMS-HCC)   Hyperlipidemia   Rhinovirus  Active Problems  #AHRF  #Rhinovirus  #COPD exacerbation  Sarcoidosis vs ILD Presented from home with severe hypoxemia (O2 saturations in the 60s on room air) and clinical signs of respiratory distress, including retractions and wheezing. Does not have home oxygen requirement at baseline. Found to test positive for rhinovirus. CXR with evidence of moderate interstitial edema. Follows with [facility]. On Ellipta and ProAir at home.   Patient feeling better but still having trouble breathing today, encouraged to continue with IS  and current medical therapy. Recent spirometry with  FEV1/FVC 96% showing evidence of restrictive lung disease. Supported by ground glass opacities and lung nodules on CT. However, admission blood gases with respiratory acidosis pattern pointing towards acute retention of CO2, context of smoking for 50+ years, likely acute COPD exacerbation on chronic lung disease. Home therapy missing ICS, Trelegy no cost on trial claim, will plan to discharge on full triple therapy. Checking sputum culture in case infectious process lurking.  - Duonebs Q6H - Albuterol PRN  - Prednisone 40 mg daily for 5 days  - Azithromycin 500 mg for 5 days  - Continue to wean O2 as tolerated, currently on 5L nasal cannula - Incentive Spirometry Q2H while awake  - CBC, BMP, Mag daily  - Sputum culture ordered - Consider Pulmonology consult if respiratory status worsens - Plan to start Trelegy on discharge - Tobacco Cessation consulted   #Hypernatremia- resovled Na at 148 in ED, decreased to 141 on repeat  - Continue to monitor BMP   Chronic Problems  #CHF  Last Echo 07/2023 with LVEF 40%, Mod AR, Mild MR, Moderate TR. On admission, BNP elevated at 2069. Per chart review, prior BNPs documented around 3000.  - Continue Home Lasix  40mg  (will verify dose with daughter) - Caution with fluid resuscitation  - Trend BMP   #HTN  - Hold home losartan and amlodipine due to softer pressures since admission.Unclear home regimen. Has medication bubble packs and need to clarify with daughter for correct doses.    #CAD  #HLD  Coronary artery disease status post PCI to proximal RCA (09/19/1998), status post DES x2 to mid LAD (03/13/2021). On Plavix  given hx of stents.  - Continue Plavix  75 mg daily  - Continue Aspirin  81 mg  -  Continue Atorvastatin  40 mg daily    #CKD  Per chart review baseline 1.2 to 1.6. On admission, Cr 1.54 which is around baseline.  - Trend BMP  - Caution with nephrotoxic agents    #T2DM  A1C  documented at 9.7 earlier this year. Has been on Ozempic, with A1C now of 6.6.  Patient is unsure of home medications, hold home diabetes regimen until discussing med rec with her daughter.  - Hold home meds - POCT glucose before meals and at bedtime - SSI    #Chronic Back Pain  On oxycodone  PRN for chronic back pain.  - Continue oxycodone  15 mg twice daily, 7.5 mg PRN mid-day    #DVT Prophylaxis  - Continue Lovenox  40 mg   Issues Impacting Complexity of Management: <redacted file path> -The patient is at high risk of complications from COPD, CHF, and CKD  Daily Checklist: Diet: Regular Diet DVT PPx: Lovenox  40mg  q24h Code Status: Full Code Dispo: Patient appropriate for Observation based on expectation at time of admission that period of observation will last less than two midnights   Team Contact Information:  Primary Team: Family Medicine [place] Primary Resident: Elsie Sharps, MD Resident's Pager: [facility] [place] [phone])  Interval History:  Patient was weaned to 5L [place] from BIPAP overnight. She feels improved but still coughing often.   Objective:  Temp:  [36.7 C (98 F)-37.3 C (99.1 F)] 36.7 C (98.1 F) Pulse:  [81-107] 83 SpO2 Pulse:  [86-99] 86 Resp:  [16-44] 17 BP: (98-146)/(37-78) 135/61 FiO2 (%):  [5 %-60 %] 5 % SpO2:  [81 %-99 %] 97 %  Gen: NAD, converses appropriately  HENT: atraumatic, normocephalic Heart: RRR Lungs: Wheezes bilaterally.  Abdomen: soft, NTND Extremities: No edema  Jeoffrey Friedlander, MS4  I attest that I have reviewed the student note and that the components of the history of the present illness, the physical exam, and the assessment and plan documented were performed by me or were performed in my presence by the student where I verified the documentation and performed (or re-performed) the exam and medical decision making.  Soyla Sharps MD, (he/him) Resident Physician, PGY- 2 Department of [facility] Medicine

## 2024-03-16 NOTE — Care Plan (Signed)
 Shift Summary Lung sounds are coarse, with wheezing and diminished to bases Initially bipap 60% was required, however, patient was able to tolerate weaning of oxygen therapy Oxygen therapy was adjusted, including transition to Maxbass, humidification and reduction in FiO2, to maintain adequate SpO2 levels >88% Contact / Droplet precautions maintained for Rhinovirus, azithromycin as ordered Family was present and updated, and care handoff was completed at shift end.  Patient remained independent in mobility and followed commands, with no hospital-acquired injuries or adverse reactions documented.  Absence of Hospital-Acquired Illness or Injury: No hospital-acquired injuries or infections documented; aseptic technique and infection prevention measures were maintained throughout the shift, and absorbent pads and perineal care were provided as needed. Isolation precautions and safety interventions were consistently implemented.  Optimal Comfort and Wellbeing: Pain remained at 0 throughout the shift and no emesis occurred; psychosocial status was within defined limits and comfort interventions such as frequent repositioning and head of bed elevation were maintained.  Readiness for Transition of Care: Patient followed commands and remained independent in mobility, with no assistive devices required; family was updated and present, and care handoff was completed at the end of the shift.  Rounds/Family Conference: Family was updated and present at bedside during the shift, supporting communication and involvement in care decisions.  Optimal Gas Exchange: SpO2 fluctuated but generally remained above 94% with oxygen therapy and adjustments in O2 flow rate and FiO2; ipratropium-albuterol was administered twice, and oxygen was humidified as needed to support respiratory status.

## 2024-03-17 ENCOUNTER — Other Ambulatory Visit
Admission: RE | Admit: 2024-03-17 | Discharge: 2024-03-17 | Disposition: A | Source: Ambulatory Visit | Attending: Specialist | Admitting: Specialist

## 2024-03-17 ENCOUNTER — Other Ambulatory Visit: Payer: Self-pay

## 2024-03-17 ENCOUNTER — Inpatient Hospital Stay (HOSPITAL_COMMUNITY)
Admission: EM | Admit: 2024-03-17 | Discharge: 2024-03-17 | DRG: 189 | Discharge status: Against Medical Advice | Attending: Internal Medicine | Admitting: Internal Medicine

## 2024-03-17 ENCOUNTER — Other Ambulatory Visit: Payer: Self-pay | Admitting: Internal Medicine

## 2024-03-17 ENCOUNTER — Encounter (HOSPITAL_COMMUNITY): Payer: Self-pay

## 2024-03-17 ENCOUNTER — Emergency Department (HOSPITAL_COMMUNITY)

## 2024-03-17 DIAGNOSIS — R0602 Shortness of breath: Secondary | ICD-10-CM | POA: Insufficient documentation

## 2024-03-17 DIAGNOSIS — J849 Interstitial pulmonary disease, unspecified: Secondary | ICD-10-CM | POA: Diagnosis present

## 2024-03-17 DIAGNOSIS — Z7982 Long term (current) use of aspirin: Secondary | ICD-10-CM

## 2024-03-17 DIAGNOSIS — J9601 Acute respiratory failure with hypoxia: Principal | ICD-10-CM | POA: Diagnosis present

## 2024-03-17 DIAGNOSIS — J439 Emphysema, unspecified: Secondary | ICD-10-CM | POA: Diagnosis present

## 2024-03-17 DIAGNOSIS — J441 Chronic obstructive pulmonary disease with (acute) exacerbation: Secondary | ICD-10-CM | POA: Diagnosis present

## 2024-03-17 DIAGNOSIS — B348 Other viral infections of unspecified site: Secondary | ICD-10-CM

## 2024-03-17 DIAGNOSIS — Z7902 Long term (current) use of antithrombotics/antiplatelets: Secondary | ICD-10-CM

## 2024-03-17 DIAGNOSIS — Z5329 Procedure and treatment not carried out because of patient's decision for other reasons: Secondary | ICD-10-CM | POA: Diagnosis present

## 2024-03-17 DIAGNOSIS — I110 Hypertensive heart disease with heart failure: Secondary | ICD-10-CM | POA: Diagnosis present

## 2024-03-17 DIAGNOSIS — R7989 Other specified abnormal findings of blood chemistry: Secondary | ICD-10-CM

## 2024-03-17 DIAGNOSIS — R0902 Hypoxemia: Secondary | ICD-10-CM | POA: Insufficient documentation

## 2024-03-17 DIAGNOSIS — I509 Heart failure, unspecified: Secondary | ICD-10-CM

## 2024-03-17 DIAGNOSIS — Z79899 Other long term (current) drug therapy: Secondary | ICD-10-CM

## 2024-03-17 DIAGNOSIS — Z7984 Long term (current) use of oral hypoglycemic drugs: Secondary | ICD-10-CM | POA: Diagnosis not present

## 2024-03-17 DIAGNOSIS — I5033 Acute on chronic diastolic (congestive) heart failure: Secondary | ICD-10-CM | POA: Diagnosis present

## 2024-03-17 DIAGNOSIS — J449 Chronic obstructive pulmonary disease, unspecified: Secondary | ICD-10-CM

## 2024-03-17 LAB — CBC
HCT: 40.3 % (ref 36.0–46.0)
Hemoglobin: 13.6 g/dL (ref 12.0–15.0)
MCH: 31.6 pg (ref 26.0–34.0)
MCHC: 33.7 g/dL (ref 30.0–36.0)
MCV: 93.5 fL (ref 80.0–100.0)
Platelets: 337 K/uL (ref 150–400)
RBC: 4.31 MIL/uL (ref 3.87–5.11)
RDW: 15.9 % — ABNORMAL HIGH (ref 11.5–15.5)
WBC: 21.4 K/uL — ABNORMAL HIGH (ref 4.0–10.5)
nRBC: 0 % (ref 0.0–0.2)

## 2024-03-17 LAB — PROCALCITONIN: Procalcitonin: <0.1 ng/mL

## 2024-03-17 LAB — BASIC METABOLIC PANEL WITH GFR
Anion gap: 11 (ref 5–15)
BUN: 33 mg/dL — ABNORMAL HIGH (ref 8–23)
CO2: 24 mmol/L (ref 22–32)
Calcium: 9.4 mg/dL (ref 8.9–10.3)
Chloride: 102 mmol/L (ref 98–111)
Creatinine, Ser: 1.79 mg/dL — ABNORMAL HIGH (ref 0.44–1.00)
GFR, Estimated: 31 mL/min — ABNORMAL LOW (ref >60)
Glucose, Bld: 151 mg/dL — ABNORMAL HIGH (ref 70–99)
Potassium: 4.3 mmol/L (ref 3.5–5.1)
Sodium: 137 mmol/L (ref 135–145)

## 2024-03-17 LAB — BRAIN NATRIURETIC PEPTIDE: B Natriuretic Peptide: 473.2 pg/mL — ABNORMAL HIGH (ref 0.0–100.0)

## 2024-03-17 LAB — TROPONIN I (HIGH SENSITIVITY)
Troponin I (High Sensitivity): 31 ng/L — ABNORMAL HIGH (ref <18)
Troponin I (High Sensitivity): 30 ng/L — ABNORMAL HIGH (ref <18)

## 2024-03-17 LAB — D-DIMER, QUANTITATIVE: D-Dimer, Quant: 1.5 ug{FEU}/mL — ABNORMAL HIGH (ref 0.00–0.50)

## 2024-03-17 MED ORDER — ACETAMINOPHEN 650 MG RE SUPP: 650.0000 mg | Freq: Four times a day (QID) | RECTAL | Status: DC | PRN

## 2024-03-17 MED ORDER — IPRATROPIUM-ALBUTEROL 0.5-2.5 (3) MG/3ML IN SOLN
3.0000 mL | Freq: Four times a day (QID) | RESPIRATORY_TRACT | Status: DC
  Administered 2024-03-17: 3 mL via RESPIRATORY_TRACT
  Filled 2024-03-17: qty 3

## 2024-03-17 MED ORDER — ALBUTEROL SULFATE (2.5 MG/3ML) 0.083% IN NEBU: 2.5000 mg | INHALATION_SOLUTION | RESPIRATORY_TRACT | Status: DC | PRN

## 2024-03-17 MED ORDER — ACETAMINOPHEN 325 MG PO TABS: 650.0000 mg | ORAL_TABLET | Freq: Four times a day (QID) | ORAL | Status: DC | PRN

## 2024-03-17 MED ORDER — METHYLPREDNISOLONE SODIUM SUCC 125 MG IJ SOLR
125.0000 mg | Freq: Once | INTRAMUSCULAR | Status: AC
  Administered 2024-03-17: 125 mg via INTRAVENOUS
  Filled 2024-03-17: qty 2

## 2024-03-17 MED ORDER — FUROSEMIDE 10 MG/ML IJ SOLN
40.0000 mg | INTRAMUSCULAR | Status: AC
  Administered 2024-03-17: 40 mg via INTRAVENOUS
  Filled 2024-03-17: qty 4

## 2024-03-17 MED ORDER — IPRATROPIUM BROMIDE 0.02 % IN SOLN
0.5000 mg | Freq: Once | RESPIRATORY_TRACT | Status: AC
  Administered 2024-03-17: 0.5 mg via RESPIRATORY_TRACT
  Filled 2024-03-17: qty 2.5

## 2024-03-17 MED ORDER — OXYCODONE HCL 5 MG PO TABS
15.0000 mg | ORAL_TABLET | Freq: Once | ORAL | Status: AC
  Administered 2024-03-17: 15 mg via ORAL
  Filled 2024-03-17: qty 3

## 2024-03-17 MED ORDER — ALBUTEROL SULFATE (2.5 MG/3ML) 0.083% IN NEBU
5.0000 mg | INHALATION_SOLUTION | Freq: Once | RESPIRATORY_TRACT | Status: AC
  Administered 2024-03-17: 5 mg via RESPIRATORY_TRACT
  Filled 2024-03-17: qty 6

## 2024-03-17 MED ORDER — METHYLPREDNISOLONE SODIUM SUCC 125 MG IJ SOLR
80.0000 mg | Freq: Two times a day (BID) | INTRAMUSCULAR | Status: DC
  Administered 2024-03-17: 80 mg via INTRAVENOUS
  Filled 2024-03-17: qty 2

## 2024-03-17 NOTE — Progress Notes (Signed)
  Carryover admission to the Day Admitter.  I discussed this case with the EDP, Olam Slocumb, PA.  Per these discussions:   This is a 63 year old female with interstitial lung disease, COPD, chronic diastolic heart failure, who is being admitted with acute hypoxic respiratory failure in the setting of acute COPD exacerbation as well as a suspected contribution from acute on chronic diastolic heart failure after presenting with 4 to 5 days of progressive shortness of breath associated with wheezing, orthopnea.  Not on a baseline supplemental oxygen.  Was hospitalized at [facility] on November 30 for the above, before leaving AMA yesterday.  During her hospital course at [facility], she received both IV and oral steroids and was found to be positive for rhinovirus.  She noted further progression in her symptoms after leaving AMA, prompting her to present to [facility] emergency department this evening.  Noted to be hypoxic into the 80s on room air, Sosan improving into the mid 90s on 2 L nasal cannula.  CBC notable for with cell count 21,000, will noting recent IV and oral steroids at [facility].  Chest x-ray shows findings consistent with her chronic emphysema as well as interstitial lung disease, without any evidence of pulmonary edema or pleural effusion.  In the ED this evening, she received Solu-Medrol, Lasix , duo nebulizer treatment.  She is amenable to being admitted to the hospital following her discussions with the EDP.   I have placed an order for inpatient admission for further evaluation management of the above.  I have placed some additional preliminary admit orders via the adult multi-morbid admission order set. I have also ordered additional Solu-Medrol, scheduled duo nebulizer treatments, prn albuterol nebulizers and added on a procalcitonin level.  Will defer to the admitting hospitalist potential orders for additional IV diuresis.    Eva Pore, DO Hospitalist

## 2024-03-17 NOTE — ED Notes (Signed)
 Pt sitting up in bed, pt talking in full sentences, pt has wet, mildly productive cough, pt reports some chest and back pain, pt requests to go outside and have a cigarette, offered pt nicotine  patch or gum.  Pt ate very little of breakfast.

## 2024-03-17 NOTE — ED Notes (Signed)
 Pt has removed all monitor leads, pt agitated states that we haven't done anything for her, apologized and explained her treatments, offered nicotine  patch or gum because pt also c/o inability to have a cigarette, pt states that she wants to leave, md notified

## 2024-03-17 NOTE — H&P (Incomplete)
 TelemedicineHistory and Physical    Patient: Tasha Myers DOB: [birth year] DOA: 03/17/2024 DOS: the patient was seen and examined on 03/17/2024 PCP: Rudy Alyce RAMAN, MD   Referring Provider: Olam Slocumb, PA-C Telemedicine Provider: Burnard Cunning, DO Patient Location: [facility]  Referring Diagnosis: Acute hypoxic respiratory failure due to COPD exacerbation Patient Name and DOB verified: *** Patient consented to Telemedicine Evaluation:*** RN virtual assistant: Caron Bamberg Video encounter time and date:***    Patient coming from: {Point_of_Origin:26777}  Chief Complaint:  Chief Complaint  Patient presents with   Shortness of Breath   Back Pain   HPI: Tasha Myers is a 63 y.o. female with medical history significant of ***  Review of Systems: {ROS_Text:26778} Past Medical History:  Diagnosis Date   Arthritis    CAD S/P percutaneous coronary angioplasty    a. s/p BMS stent to RCA ~2000; b. PTCA of ISR in 2001; c. 09/2013 Cath: LM nl, LAD 66m, LCX min irregs, RI min irregs, RCA 20p ISR.   Chronic combined systolic and diastolic CHF (congestive heart failure) (HCC)    a. 09/2013 Echo: EF 45-50%, no rwma, Gr2 DD, mild AI, mildly dil LA.   CKD (chronic kidney disease), stage III (HCC)    COPD (chronic obstructive pulmonary disease) (HCC)    Essential hypertension    GERD (gastroesophageal reflux disease)    HLD (hyperlipidemia)    Insomnia    MI (myocardial infarction) (HCC)    Thoracic or lumbosacral neuritis or radiculitis, unspecified    Type II diabetes mellitus (HCC)    Past Surgical History:  Procedure Laterality Date   ABDOMINAL HYSTERECTOMY     BREAST BIOPSY Right 2014   neg x 3 areas   BREAST EXCISIONAL BIOPSY Left YRS AGO   NEG   CARPAL TUNNEL RELEASE     BILATERAL   COLONOSCOPY WITH PROPOFOL  N/A 08/09/2021   Procedure: COLONOSCOPY WITH PROPOFOL ;  Surgeon: Toledo, Ladell POUR, MD;  Location: [facility] ENDOSCOPY;  Service: Gastroenterology;   Laterality: N/A;  DM   CORONARY ARTERY BYPASS GRAFT     INTRAMEDULLARY (IM) NAIL INTERTROCHANTERIC Right 09/10/2018   Procedure: INTRAMEDULLARY (IM) NAIL INTERTROCHANTRIC;  Surgeon: Addie Cordella Hamilton, MD;  Location: [facility] OR;  Service: Orthopedics;  Laterality: Right;   LEFT HEART CATHETERIZATION WITH CORONARY ANGIOGRAM N/A 09/28/2013   Procedure: LEFT HEART CATHETERIZATION WITH CORONARY ANGIOGRAM;  Surgeon: Alm LELON Clay, MD;  Location: [facility] CATH LAB;  Service: Cardiovascular;  Laterality: N/A;   LYMPH NODE RESECTION     PARTIAL HYSTERECTOMY     Social History:  reports that she has been smoking cigarettes. She has a 42 pack-year smoking history. She has never used smokeless tobacco. She reports current alcohol use of about 2.0 standard drinks of alcohol per week. She reports that she does not use drugs.  Allergies  Allergen Reactions   Empagliflozin Other (See Comments)    UTI/sepsis   Iodinated Contrast Media Itching    Family History  Problem Relation Age of Onset   Coronary artery disease Mother    Hypertension Mother    Stroke Mother    Diabetes Father    Stroke Other        sibling   Coronary artery disease Other        sibling   Diabetes Other        sibling   Hypertension Other        sibling   Breast cancer Neg Hx     Prior  to Admission medications   Medication Sig Start Date End Date Taking? Authorizing Provider  albuterol (VENTOLIN HFA) 108 (90 Base) MCG/ACT inhaler Inhale 2 puffs into the lungs every 6 (six) hours as needed for wheezing or shortness of breath.   Yes [provider]  aspirin  EC 81 MG EC tablet Take 1 tablet (81 mg total) by mouth daily. 09/11/18  Yes Guilloud, Carolyn, MD  carvedilol  (COREG ) 12.5 MG tablet Take 12.5 mg by mouth 2 (two) times daily with a meal.    Yes [provider]  cholecalciferol (VITAMIN D3) 25 MCG (1000 UT) tablet Take 1,000 Units by mouth daily.   Yes [provider]  clopidogrel  (PLAVIX ) 75 MG  tablet TAKE 1 TABLET BY MOUTH ONCE DAILY. Patient taking differently: Take 75 mg by mouth daily. 07/10/17  Yes Gollan, Timothy J, MD  Cyanocobalamin  (VITAMIN B-12) 2500 MCG SUBL Place 2,500 tablets under the tongue daily.   Yes [provider]  furosemide  (LASIX ) 40 MG tablet Take 80 mg by mouth daily.   Yes [provider]  glipiZIDE  (GLUCOTROL  XL) 10 MG 24 hr tablet Take 10 mg by mouth daily.   Yes [provider]  isosorbide  mononitrate (IMDUR ) 30 MG 24 hr tablet Take 30 mg by mouth daily.   Yes [provider]  losartan (COZAAR) 25 MG tablet Take 25 mg by mouth daily. 08/09/23  Yes [provider]  nitroGLYCERIN  (NITROSTAT ) 0.4 MG SL tablet Place 0.4 mg under the tongue every 5 (five) minutes as needed for chest pain.    Yes [provider]  oxyCODONE  (ROXICODONE ) 15 MG immediate release tablet Take 15 mg by mouth 3 (three) times daily as needed for pain.  08/15/18  Yes [provider]  OZEMPIC, 1 MG/DOSE, 4 MG/3ML SOPN Inject 1 mg into the skin once a week. 02/11/24  Yes [provider]  potassium chloride  SA (KLOR-CON ) 20 MEQ tablet Take 20 mEq by mouth daily. 08/26/19  Yes [provider]  rosuvastatin  (CRESTOR ) 40 MG tablet Take 40 mg by mouth daily.   Yes [provider]  sitaGLIPtin (JANUVIA) 50 MG tablet Take 50 mg by mouth daily.  10/15/16 03/17/24 Yes [provider]  TRELEGY ELLIPTA 100-62.5-25 MCG/ACT AEPB Inhale 1 puff into the lungs. 03/16/24  Yes [provider]  azithromycin (ZITHROMAX) 500 MG tablet Take 500 mg by mouth daily. Patient not taking: Reported on 03/17/2024 03/16/24   [provider]  MOVANTIK 25 MG TABS tablet Take 25 mg by mouth daily. Patient not taking: Reported on 03/17/2024 03/11/24   [provider]  pantoprazole  (PROTONIX ) 20 MG tablet Take 20 mg by mouth daily.  Patient not taking: Reported on 03/17/2024 06/17/17 12/21/20  [provider]     Physical Exam: Vitals:   03/17/24 0323 03/17/24 0501 03/17/24 0510 03/17/24 0758  BP: (!) 141/61   (!) 140/56  Pulse: 87   95  Resp: 18   20  Temp: 97.9 F (36.6 C)   98 F (36.7 C)  TempSrc: Oral   Oral  SpO2: 94%  93% 94%  Weight:  63.5 kg    Height:       Bedside physical exam was performed by RN listed above. Below exam findings are based on their in person physical exam findings and my observations during virtual encounter.   ***   Data Reviewed: {Tip this will not be part of the note when signed- Document your independent interpretation of telemetry tracing, EKG, lab,  Radiology test or any other diagnostic tests. Add any new diagnostic test ordered today. (Optional):26781} {Results:26384}  Assessment and Plan: No notes have been filed under this hospital service. Service: Hospitalist     Advance Care Planning:   Code Status: Full Code   Consults: ***  Family Communication: ***  Severity of Illness: {Observation/Inpatient:21159}  Author: Burnard DELENA Cunning, DO 03/17/2024 8:58 AM  For on call review [email or web address].

## 2024-03-17 NOTE — Progress Notes (Signed)
 Heart Failure Navigator Progress Note  Assessed for Heart & Vascular TOC clinic readiness.  Patient does not meet criteria due to is seen by [facility]. No HF TOC. .   Navigator will sign off at this time.   Stephane Haddock, BSN, Scientist, Clinical (histocompatibility And Immunogenetics) Only

## 2024-03-17 NOTE — ED Notes (Signed)
 Pt ambulated to restroom without assistance.

## 2024-03-17 NOTE — ED Notes (Signed)
 Attempted to get MD on ipad to talk with pt, Pt standing at nurses station, pt agitated, attempted to de escalate pt, pt states that she has called her md and has an appointment at 10, explained to pt the care she received and her plan of care, pt states that no one has done anything for her and she is leaving, explained that her lungs didn't sound good and encouraged her to stay, pt verbalized understanding risks and is talking in full sentences, pt ambulatory from department, md notified that pt walked out.

## 2024-03-17 NOTE — ED Triage Notes (Signed)
 Pt arrived from home via POV c/o sob and chf. Pt states that she left [facility] admitted AMA. Pt now believes that she needs to be admitted to the hospital as she can not breathe

## 2024-03-17 NOTE — ED Provider Notes (Signed)
 [place] EMERGENCY DEPARTMENT AT [facility] Provider Note   CSN: [id number] Arrival date & time: 03/17/24  0101     Patient presents with: Shortness of Breath and Back Pain   Tasha Myers is a 63 y.o. female.   The history is provided by the patient and medical records.  Shortness of Breath Back Pain  63 year old female with history of coronary artery disease, CHF, diabetes, interstitial lung disease, hypertension, presenting to the ED for shortness of breath.  Has been ongoing for a few days now.  Initially presented to emergency hospital 03/15/2024 with respiratory distress and was placed on BiPAP.  She was transitioned off of this but was still requiring supplemental oxygen.  She abruptly left AMA yesterday evening as she was tired of being in the hospital.  Yesterday evening after leaving she got significantly worse.  States she has extreme shortness of breath with any attempted exertional activity or if trying to lie flat.  She is unsure about weight gain but denies LE edema.  Has had a little cough but denies fever/chills.  Did test + for rhinovirus/enterovirus on 03/15/2024 while at [facility].  No sick contacts.  She was on steroids while in hospital but did not get any prescriptions since she left AMA.  States she feels worse now and know she needs to be hospitalized.  Currently on 2L supplemental O2, generally not oxygen dependent.  Prior to Admission medications   Medication Sig Start Date End Date Taking? Authorizing Provider  albuterol (VENTOLIN HFA) 108 (90 Base) MCG/ACT inhaler Inhale into the lungs every 6 (six) hours as needed for wheezing or shortness of breath.    [provider]  amLODipine (NORVASC) 2.5 MG tablet Take 2.5 mg by mouth daily. 09/04/18   [provider]  APPLE CIDER VINEGAR PO Take 450 mg by mouth daily.    [provider]  aspirin  EC 81 MG EC tablet Take 1 tablet (81 mg total) by mouth daily. 09/11/18   Forest Coy,  MD  carvedilol  (COREG ) 12.5 MG tablet Take 12.5 mg by mouth 2 (two) times daily with a meal.     [provider]  cholecalciferol (VITAMIN D3) 25 MCG (1000 UT) tablet Take 2,000 Units by mouth daily.     [provider]  clopidogrel  (PLAVIX ) 75 MG tablet TAKE 1 TABLET BY MOUTH ONCE DAILY. Patient taking differently: Take 75 mg by mouth daily. 07/10/17   Gollan, Timothy J, MD  Cyanocobalamin  (VITAMIN B-12) 2500 MCG SUBL Place 2,500 tablets under the tongue daily.    [provider]  furosemide  (LASIX ) 20 MG tablet Take 60 mg by mouth daily.    [provider]  gabapentin  (NEURONTIN ) 300 MG capsule Take 300 mg by mouth in the morning and at bedtime. 03/08/20   [provider]  glipiZIDE  (GLUCOTROL  XL) 10 MG 24 hr tablet Take 10 mg by mouth 2 (two) times daily.     [provider]  isosorbide  mononitrate (IMDUR ) 30 MG 24 hr tablet Take 30 mg by mouth daily.    [provider]  nitroGLYCERIN  (NITROSTAT ) 0.4 MG SL tablet Place 0.4 mg under the tongue every 5 (five) minutes as needed for chest pain.     [provider]  ondansetron  (ZOFRAN ) 4 MG tablet Take 1 tablet (4 mg total) by mouth every 6 (six) hours. 08/22/20   White, Shelba SAUNDERS, NP  oxyCODONE  (ROXICODONE ) 15 MG immediate release tablet Take 15 mg by mouth 3 (three) times  daily as needed for pain.  08/15/18   [provider]  pantoprazole  (PROTONIX ) 20 MG tablet Take 20 mg by mouth daily.  06/17/17 12/21/20  [provider]  potassium chloride  SA (KLOR-CON ) 20 MEQ tablet Take 20 mEq by mouth daily. 08/26/19   [provider]  rosuvastatin  (CRESTOR ) 20 MG tablet Take 20 mg by mouth daily.  09/04/17   [provider]  sitaGLIPtin (JANUVIA) 50 MG tablet Take 50 mg by mouth daily.  10/15/16 12/21/20  [provider]  umeclidinium-vilanterol (ANORO ELLIPTA) 62.5-25 MCG/ACT AEPB Inhale 1 puff into the lungs 2 (two) times daily.    [provider]    Allergies: Empagliflozin and Iodinated contrast media    Review of Systems  Respiratory:  Positive for shortness of breath.   Musculoskeletal:  Positive for back pain.    Updated Vital Signs BP (!) 141/61 (BP Location: Right Arm)   Pulse 87   Temp 97.9 F (36.6 C) (Oral)   Resp 18   Ht 5' 5 (1.651 m)   Wt 63.5 kg   SpO2 94%   BMI 23.30 kg/m   Physical Exam Vitals and nursing note reviewed.  Constitutional:      Appearance: She is well-developed.  HENT:     Head: Normocephalic and atraumatic.  Eyes:     Conjunctiva/sclera: Conjunctivae normal.     Pupils: Pupils are equal, round, and reactive to light.  Cardiovascular:     Rate and Rhythm: Normal rate and regular rhythm.     Heart sounds: Normal heart sounds.  Pulmonary:     Effort: Pulmonary effort is normal.     Breath sounds: Wheezing and rales present.     Comments: Intermixed wheezes and rales, audible from bedside, 2L O2 in use and sats marginal around 94-95% during exam Abdominal:     General: Bowel sounds are normal.     Palpations: Abdomen is soft.  Musculoskeletal:        General: Normal range of motion.     Cervical back: Normal range of motion.  Skin:    General: Skin is warm and dry.  Neurological:     Mental Status: She is alert and oriented to person, place, and time.     (all labs ordered are listed, but only abnormal results are displayed) Labs Reviewed  BASIC METABOLIC PANEL WITH GFR - Abnormal; Notable for the following components:      Result Value   Glucose, Bld 151 (*)    BUN 33 (*)    Creatinine, Ser 1.79 (*)    GFR, Estimated 31 (*)    All other components within normal limits  CBC - Abnormal; Notable for the following components:   WBC 21.4 (*)    RDW 15.9 (*)    All other components within normal limits  BRAIN NATRIURETIC PEPTIDE - Abnormal; Notable for the following components:   B Natriuretic Peptide 473.2 (*)    All other components within normal limits  TROPONIN I  (HIGH SENSITIVITY) - Abnormal; Notable for the following components:   Troponin I (High Sensitivity) 31 (*)    All other components within normal limits  PROCALCITONIN    EKG: None  Radiology: DG Chest 2 View Result Date: 03/17/2024 EXAM: 2 VIEW(S) XRAY OF THE CHEST 03/17/2024 02:55:00 AM COMPARISON: 02/14/2021 and CT 10 / 30 / 25 CLINICAL HISTORY: sob FINDINGS: LUNGS AND PLEURA: Patchy opacity in the left lung base likely due to atelectasis . Chronic diffuse interstitial coarsening.  No pleural effusion. No pneumothorax. HEART AND MEDIASTINUM: Stable cardiomediastinal silhouette. Aortic atherosclerotic calcification. BONES AND SOFT TISSUES: No acute osseous abnormality. IMPRESSION: 1. Chronic diffuse interstitial coarsening suggesting emphysema / interstitial lung disease. 2. Airspace opacity in the left lung base favors atelectasis. Electronically signed by: Norman Gatlin MD 03/17/2024 03:19 AM EST RP Workstation: [id number]     Procedures   CRITICAL CARE Performed by: Olam CHRISTELLA Slocumb   Total critical care time: 35 minutes  Critical care time was exclusive of separately billable procedures and treating other patients.  Critical care was necessary to treat or prevent imminent or life-threatening deterioration.  Critical care was time spent personally by me on the following activities: development of treatment plan with patient and/or surrogate as well as nursing, discussions with consultants, evaluation of patient's response to treatment, examination of patient, obtaining history from patient or surrogate, ordering and performing treatments and interventions, ordering and review of laboratory studies, ordering and review of radiographic studies, pulse oximetry and re-evaluation of patient's condition.   Medications Ordered in the ED  acetaminophen  (TYLENOL ) tablet 650 mg (has no administration in time range)    Or  acetaminophen  (TYLENOL ) suppository 650 mg (has no administration in  time range)  methylPREDNISolone sodium succinate (SOLU-MEDROL) 125 mg/2 mL injection 80 mg (has no administration in time range)  ipratropium-albuterol (DUONEB) 0.5-2.5 (3) MG/3ML nebulizer solution 3 mL (has no administration in time range)  albuterol (PROVENTIL) (2.5 MG/3ML) 0.083% nebulizer solution 2.5 mg (has no administration in time range)  furosemide  (LASIX ) injection 40 mg (40 mg Intravenous Given 03/17/24 0431)  methylPREDNISolone sodium succinate (SOLU-MEDROL) 125 mg/2 mL injection 125 mg (125 mg Intravenous Given 03/17/24 0431)  albuterol (PROVENTIL) (2.5 MG/3ML) 0.083% nebulizer solution 5 mg (5 mg Nebulization Given 03/17/24 0422)  ipratropium (ATROVENT) nebulizer solution 0.5 mg (0.5 mg Nebulization Given 03/17/24 0422)  oxyCODONE  (Oxy IR/ROXICODONE ) immediate release tablet 15 mg (15 mg Oral Given 03/17/24 0419)                                    Medical Decision Making Amount and/or Complexity of Data Reviewed Labs: ordered. Radiology: ordered and independent interpretation performed. ECG/medicine tests: ordered and independent interpretation performed.  Risk Prescription drug management. Decision regarding hospitalization.   63 year old female presenting to the ED with shortness of breath.  Initially admitted to [facility] on 03/15/2024 on BiPAP, was transition down to supplemental oxygen but left AMA yesterday.  Throughout the evening last night she continued to decline.  Back to needing supplemental oxygen here, currently on 2 L and sats in the low 90s around 94 to 95% during exam.  She does have audible wheezes and rales from the bedside.  EKG today similar to prior, LBBB.  No acute ischemia noted.  Labs as above-- WBC count 21.4-- has been on steroids at [facility].  No significant electrolyte derangement.  BNP 473.  Troponin 31, suspect likely some demand ischemia.  Chest x-ray with emphysema, infiltrate favored to represent atelectasis.  Did recently test + for rhinovirus which may be  contributing.  She will require admission.    Discussed with hospitalist, Dr. Marcene-- will admit for ongoing care.  Final diagnoses:  Shortness of breath  Chronic obstructive pulmonary disease, unspecified COPD type (HCC)  Acute on chronic congestive heart failure, unspecified heart failure type (HCC)  Rhinovirus    ED Discharge Orders     None  Jarold Olam HERO, PA-C 03/17/24 [id number]    Theadore Ozell HERO, MD 03/17/24 [phone]

## 2024-03-19 ENCOUNTER — Ambulatory Visit: Admission: RE | Admit: 2024-03-19 | Discharge: 2024-03-19 | Attending: Specialist | Admitting: Specialist

## 2024-03-19 DIAGNOSIS — R7989 Other specified abnormal findings of blood chemistry: Secondary | ICD-10-CM | POA: Insufficient documentation

## 2024-03-19 DIAGNOSIS — R0602 Shortness of breath: Secondary | ICD-10-CM | POA: Insufficient documentation

## 2024-03-19 MED ORDER — IOHEXOL 350 MG/ML SOLN
75.0000 mL | Freq: Once | INTRAVENOUS | Status: AC | PRN
  Administered 2024-03-19: 75 mL via INTRAVENOUS

## 2024-04-08 NOTE — H&P (Signed)
" °  History and Physical    Patient: Tasha Myers FMW:[id number] DOB: 12/11/1960 DOA: 03/17/2024 DOS: the patient was seen and examined on 04/08/2024 PCP: Rudy Alyce RAMAN, MD    Patient was accepted for admission by my colleague overnight, however patient left AGAINST MEDICAL ADVICE before being seen for admission.  No history and physical were performed due to patient refusal and leaving before being seen. "
